# Patient Record
Sex: Male | Born: 1985 | Race: White | Hispanic: No | Marital: Single | Smoking: Former smoker
Health system: Southern US, Community
[De-identification: ages and names within clinical notes are randomized; demographics above are authoritative.]

## PROBLEM LIST (undated history)

## (undated) DIAGNOSIS — M352 Behcet's disease: Secondary | ICD-10-CM

## (undated) DIAGNOSIS — M1990 Unspecified osteoarthritis, unspecified site: Secondary | ICD-10-CM

## (undated) DIAGNOSIS — B019 Varicella without complication: Secondary | ICD-10-CM

## (undated) DIAGNOSIS — IMO0002 Reserved for concepts with insufficient information to code with codable children: Secondary | ICD-10-CM

## (undated) DIAGNOSIS — M797 Fibromyalgia: Secondary | ICD-10-CM

## (undated) DIAGNOSIS — G4730 Sleep apnea, unspecified: Secondary | ICD-10-CM

## (undated) DIAGNOSIS — K219 Gastro-esophageal reflux disease without esophagitis: Secondary | ICD-10-CM

## (undated) DIAGNOSIS — K649 Unspecified hemorrhoids: Secondary | ICD-10-CM

## (undated) DIAGNOSIS — G4700 Insomnia, unspecified: Secondary | ICD-10-CM

## (undated) DIAGNOSIS — K921 Melena: Secondary | ICD-10-CM

## (undated) HISTORY — DX: Reserved for concepts with insufficient information to code with codable children: IMO0002

## (undated) HISTORY — DX: Unspecified osteoarthritis, unspecified site: M19.90

## (undated) HISTORY — DX: Melena: K92.1

## (undated) HISTORY — DX: Fibromyalgia: M79.7

## (undated) HISTORY — DX: Insomnia, unspecified: G47.00

## (undated) HISTORY — DX: Varicella without complication: B01.9

## (undated) HISTORY — DX: Behcet's disease: M35.2

## (undated) HISTORY — PX: LASIK: SHX215

## (undated) HISTORY — PX: COLONOSCOPY: SHX174

## (undated) HISTORY — PX: WISDOM TOOTH EXTRACTION: SHX21

## (undated) HISTORY — DX: Gastro-esophageal reflux disease without esophagitis: K21.9

## (undated) HISTORY — DX: Unspecified hemorrhoids: K64.9

---

## 2007-08-09 ENCOUNTER — Emergency Department (HOSPITAL_COMMUNITY): Admission: EM | Admit: 2007-08-09 | Discharge: 2007-08-09 | Payer: Self-pay | Admitting: Emergency Medicine

## 2007-09-18 ENCOUNTER — Emergency Department (HOSPITAL_COMMUNITY): Admission: EM | Admit: 2007-09-18 | Discharge: 2007-09-18 | Payer: Self-pay | Admitting: Emergency Medicine

## 2010-08-16 ENCOUNTER — Ambulatory Visit (INDEPENDENT_AMBULATORY_CARE_PROVIDER_SITE_OTHER): Payer: BC Managed Care – PPO | Admitting: Internal Medicine

## 2010-08-16 ENCOUNTER — Encounter: Payer: Self-pay | Admitting: Internal Medicine

## 2010-08-16 DIAGNOSIS — R61 Generalized hyperhidrosis: Secondary | ICD-10-CM

## 2010-08-16 DIAGNOSIS — R51 Headache: Secondary | ICD-10-CM

## 2010-08-16 DIAGNOSIS — K5190 Ulcerative colitis, unspecified, without complications: Secondary | ICD-10-CM

## 2010-08-16 DIAGNOSIS — L74519 Primary focal hyperhidrosis, unspecified: Secondary | ICD-10-CM

## 2010-08-16 MED ORDER — TRAMADOL HCL 50 MG PO TABS: 50.0000 mg | ORAL_TABLET | Freq: Four times a day (QID) | ORAL | Status: DC | PRN

## 2010-08-16 MED ORDER — ALUMINUM CHLORIDE 20 % EX SOLN: Freq: Every day | CUTANEOUS | Status: DC

## 2010-08-16 MED ORDER — CEFDINIR 300 MG PO CAPS: 300.0000 mg | ORAL_CAPSULE | Freq: Two times a day (BID) | ORAL | Status: DC

## 2010-08-18 ENCOUNTER — Encounter: Payer: Self-pay | Admitting: Internal Medicine

## 2010-08-18 ENCOUNTER — Ambulatory Visit (INDEPENDENT_AMBULATORY_CARE_PROVIDER_SITE_OTHER): Payer: BC Managed Care – PPO | Admitting: Internal Medicine

## 2010-08-18 DIAGNOSIS — R51 Headache: Secondary | ICD-10-CM

## 2010-08-18 DIAGNOSIS — N481 Balanitis: Secondary | ICD-10-CM

## 2010-08-18 DIAGNOSIS — N476 Balanoposthitis: Secondary | ICD-10-CM

## 2010-08-18 DIAGNOSIS — R000 Tachycardia, unspecified: Secondary | ICD-10-CM

## 2010-08-18 MED ORDER — AZITHROMYCIN 250 MG PO TABS: ORAL_TABLET | ORAL | Status: AC

## 2010-08-18 MED ORDER — KETOROLAC TROMETHAMINE 30 MG/ML IJ SOLN: 30.0000 mg | Freq: Once | INTRAMUSCULAR | Status: DC

## 2010-08-18 MED ORDER — NYSTATIN-TRIAMCINOLONE 100000-0.1 UNIT/GM-% EX OINT: TOPICAL_OINTMENT | Freq: Two times a day (BID) | CUTANEOUS | Status: DC

## 2010-08-18 MED ORDER — KETOROLAC TROMETHAMINE 30 MG/ML IM SOLN
30.0000 mg | Freq: Once | INTRAMUSCULAR | Status: AC
  Administered 2010-08-18: 30 mg via INTRAMUSCULAR

## 2010-08-19 ENCOUNTER — Telehealth: Payer: Self-pay | Admitting: Internal Medicine

## 2010-08-19 NOTE — Telephone Encounter (Signed)
Pt called and cx appt for tomorrow. Pt feeling better. Pt hasnt taken antibiotics yet, and is wondering if he still needs to take med, since he is feeling better?

## 2010-08-20 ENCOUNTER — Ambulatory Visit: Payer: BC Managed Care – PPO | Admitting: Internal Medicine

## 2010-08-20 NOTE — Telephone Encounter (Signed)
Spoke with pt. Per Dr. Rodena Medin, since pt is feeling better he can hold off on taking the abx. Pt will call the offc if he begins to feel bad again.

## 2010-08-21 ENCOUNTER — Encounter: Payer: Self-pay | Admitting: Internal Medicine

## 2010-08-21 DIAGNOSIS — N481 Balanitis: Secondary | ICD-10-CM | POA: Insufficient documentation

## 2010-08-21 DIAGNOSIS — R000 Tachycardia, unspecified: Secondary | ICD-10-CM | POA: Insufficient documentation

## 2010-08-21 DIAGNOSIS — R51 Headache: Secondary | ICD-10-CM | POA: Insufficient documentation

## 2010-08-21 DIAGNOSIS — R519 Headache, unspecified: Secondary | ICD-10-CM | POA: Insufficient documentation

## 2010-08-21 NOTE — Assessment & Plan Note (Addendum)
Attempt Mycolog. Followup if no improvement or worsening.

## 2010-08-21 NOTE — Progress Notes (Signed)
  Subjective:    Patient ID: Keith Walker, male    DOB: [birth year], 23 y.o.   MRN: [id number]  HPI Patient presents to clinic for evaluation of headache. Last visit began Omnicef but this is causing nausea. Continues to have photophobia but has full range of motion of neck. Initially on triage was noted to be tachycardic without palpitations presyncope or syncope. Headache is persisting without neurologic deficit including numbness tingling or weakness. No difficulty with speech or visual disturbances. Also notes an odor and a slight discharge on the head of the penis but no urethral discharge. No alleviating or exacerbating factors. No skin lesions of the penis or the groin. No other complaints.  Reviewed past medical history, medications and allergies    Review of Systems see history of present illness    Objective:   Physical Exam   Physical Exam  Vitals reviewed. Constitutional:  appears well-developed and well-nourished. No distress.  HENT:  Head: Normocephalic and atraumatic.  Right Ear: Tympanic membrane, external ear and ear canal normal.  Left Ear: Tympanic membrane, external ear and ear canal normal.  Nose: Nose normal.  Mouth/Throat: Oropharynx is clear and moist. No oropharyngeal exudate.  Eyes: Conjunctivae and EOM are normal. Pupils are equal, round, and reactive to light. Right eye exhibits no discharge. Left eye exhibits no discharge. No scleral icterus.  Neck: Neck supple. No thyromegaly present.  Cardiovascular: Normal rate, regular rhythm and normal heart sounds.  Exam reveals no gallop and no friction rub.   No murmur heard. Pulmonary/Chest: Effort normal and breath sounds normal. No respiratory distress.  has no wheezes.  has no rales.  Lymphadenopathy:   no cervical adenopathy.  Neurological:  is alert. Cranial nerves II through XII grossly intact. Skin: Skin is warm and dry.  not diaphoretic.  Psychiatric: normal mood and affect.  GU; No urethral  discharge. No skin lesions noted.Nontender. Retraction of foreskin shows minimal erythema and no current discharge wound ulceration.    Assessment & Plan:

## 2010-08-21 NOTE — Assessment & Plan Note (Signed)
EKG demonstrates normal sinus rhythm with a rate of 85. Normal intervals and axis.No evidence of arrhythmia.

## 2010-08-21 NOTE — Assessment & Plan Note (Signed)
Change Omnicef to Zithromax. Given injection of Toradol 30 mg IM. Provided with Maxalt sample x1 10 mg MLT.  Schedule close followup

## 2010-08-22 ENCOUNTER — Encounter: Payer: Self-pay | Admitting: Internal Medicine

## 2010-08-22 DIAGNOSIS — K5190 Ulcerative colitis, unspecified, without complications: Secondary | ICD-10-CM | POA: Insufficient documentation

## 2010-08-22 DIAGNOSIS — R61 Generalized hyperhidrosis: Secondary | ICD-10-CM | POA: Insufficient documentation

## 2010-08-22 NOTE — Assessment & Plan Note (Signed)
Asx. Refer to GI for surveillance.

## 2010-08-22 NOTE — Assessment & Plan Note (Signed)
Neurologically nonfocal. Elements of both migraine and possible sinusitis present. Patient feels more likely is sinusitis. Begin antibiotic. Ultram p.r.n. Pain. followup if no improvement or worsening.

## 2010-08-22 NOTE — Assessment & Plan Note (Signed)
Attempt drisol.

## 2010-08-22 NOTE — Progress Notes (Signed)
  Subjective:    Patient ID: Keith Walker, male    DOB: 05-May-1985, 25 y.o.   MRN: [id number]  HPI patient presents to clinic to establish primary care and for evaluation of possible sinusitis. Notes three-day history of frontal headache with associated nausea and photophobia.Denies fever or chills or significant nasal congestion. Has no history of migraine headaches but does have a family history. Has attempted over-the-counter BC powder without improvement.Pain recently worse. No other exacerbating or alleviating factors. Denies neurologic deficit including numbness, tingling, weakness, difficulty with speech or altered visual acuity. Headache does not wake him up from sleep. 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 Previously attempted prescription antiperspirant but has run out. Denies heat intolerance or thyroid condition. Does have history of ulcerative colitis. No recent abdominal pain or blood in stool.Currently does not have a gastroenterologist following and takes no medication for this condition currently. No other complaints.  Reviewed past medical history, past surgical history, medications, allergies, social history and family history.  Review of Systems  Constitutional: Negative for fever and chills.  Neurological: Positive for headaches. Negative for dizziness, tremors, seizures, syncope, facial asymmetry, speech difficulty, weakness, light-headedness and numbness.  All other systems reviewed and are negative.       Objective:   Physical Exam  Nursing note and vitals reviewed. Constitutional: He appears well-developed and well-nourished. No distress.  HENT:  Head: Normocephalic and atraumatic.  Right Ear: Tympanic membrane, external ear and ear canal normal.  Left Ear: Tympanic membrane, external ear and ear canal normal.  Nose: Nose normal.  Mouth/Throat: Oropharynx is clear and moist. No oropharyngeal exudate.       Mild  tenderness to palpation over frontal sinus.  Eyes: Conjunctivae are normal. No scleral icterus.  Neck: Neck supple.  Cardiovascular: Normal rate, regular rhythm and normal heart sounds.  Exam reveals no gallop and no friction rub.   No murmur heard. Pulmonary/Chest: Effort normal and breath sounds normal. No respiratory distress. He has no wheezes. He has no rales.  Abdominal: Soft. Bowel sounds are normal. He exhibits no distension. There is no tenderness. There is no rebound and no guarding.  Lymphadenopathy:    He has no cervical adenopathy.  Neurological: He is alert. Abnormal reflex: mild tenderness to palpation over frontal sinus.  Skin: Skin is warm and dry. He is not diaphoretic.  neuro: Pupils are round reactive to light, EOM grossly intact, cranial nerves II through XII grossly intact, finger to nose intact bilaterally without evidence and dysmetria. Gait normal.        Assessment & Plan:

## 2010-08-25 ENCOUNTER — Ambulatory Visit (INDEPENDENT_AMBULATORY_CARE_PROVIDER_SITE_OTHER): Payer: BC Managed Care – PPO | Admitting: Internal Medicine

## 2010-08-25 ENCOUNTER — Encounter: Payer: Self-pay | Admitting: Internal Medicine

## 2010-08-25 DIAGNOSIS — K1370 Unspecified lesions of oral mucosa: Secondary | ICD-10-CM

## 2010-08-25 DIAGNOSIS — K121 Other forms of stomatitis: Secondary | ICD-10-CM

## 2010-08-25 MED ORDER — METHYLPREDNISOLONE (PAK) 4 MG PO TABS: ORAL_TABLET | ORAL | Status: AC

## 2010-08-29 ENCOUNTER — Encounter: Payer: Self-pay | Admitting: Internal Medicine

## 2010-08-29 DIAGNOSIS — K121 Other forms of stomatitis: Secondary | ICD-10-CM | POA: Insufficient documentation

## 2010-08-29 NOTE — Assessment & Plan Note (Signed)
Recurrent. Begin oral course of steroids.Followup if no improvement or worsening.

## 2010-08-29 NOTE — Progress Notes (Signed)
  Subjective:    Patient ID: Keith Walker, male    DOB: 09-26-1985, 25 y.o.   MRN: [id number]  HPI Patient presents to clinic for evaluation of oral ulcer. This long-standing history of chronic intermittent ulcers which have been difficult to treat. Has been seen by ENT and actually had one cauterized. Has attempted this disc lidocaine and steroid paste as well as antiviral medication. Only treatment that has helped in the past systemic p.o. Course of steroids. Does have history of ulcerative colitis and has had no recent flare. Currently has painful ulcer along the left proximal lateral tongue border. No alleviating or exacerbating factors. No other complaints.  Reviewed past medical history, medications and allergies    Review of Systems see HPI     Objective:   Physical Exam  Nursing note and vitals reviewed. Constitutional: He appears well-developed and well-nourished.  HENT:  Head: Normocephalic and atraumatic.  Right Ear: External ear normal.  Left Ear: External ear normal.  Mouth/Throat: Oropharynx is clear and moist. No oropharyngeal exudate.       Small shallow ulceration left lateral tongue. No other oral lesions noted  Eyes: Conjunctivae are normal. No scleral icterus.          Assessment & Plan:

## 2010-09-21 ENCOUNTER — Other Ambulatory Visit: Payer: Self-pay | Admitting: Internal Medicine

## 2010-09-21 ENCOUNTER — Ambulatory Visit (INDEPENDENT_AMBULATORY_CARE_PROVIDER_SITE_OTHER): Payer: BC Managed Care – PPO | Admitting: Internal Medicine

## 2010-09-21 ENCOUNTER — Telehealth: Payer: Self-pay | Admitting: *Deleted

## 2010-09-21 ENCOUNTER — Encounter: Payer: Self-pay | Admitting: Internal Medicine

## 2010-09-21 DIAGNOSIS — K1370 Unspecified lesions of oral mucosa: Secondary | ICD-10-CM

## 2010-09-21 DIAGNOSIS — F419 Anxiety disorder, unspecified: Secondary | ICD-10-CM

## 2010-09-21 DIAGNOSIS — F411 Generalized anxiety disorder: Secondary | ICD-10-CM

## 2010-09-21 DIAGNOSIS — K121 Other forms of stomatitis: Secondary | ICD-10-CM

## 2010-09-21 MED ORDER — METHYLPREDNISOLONE (PAK) 4 MG PO TABS: 4.0000 mg | ORAL_TABLET | Freq: Every day | ORAL | Status: DC

## 2010-09-21 MED ORDER — CITALOPRAM HYDROBROMIDE 20 MG PO TABS: 20.0000 mg | ORAL_TABLET | Freq: Every day | ORAL | Status: DC

## 2010-09-21 NOTE — Telephone Encounter (Signed)
Pt called and said that the ulcers have returned in back of throat. Pt is wondering if he could get a refill of the steroid that was prescribed last time. Pls call in to [facility].

## 2010-09-21 NOTE — Telephone Encounter (Signed)
[facility] Triage Call Report Triage Record Num: [id number] Operator: Freddie Breech Patient Name: Keith Walker Call Date & Time: 09/19/2010 11:02:53AM Patient Phone: [phone] PCP: Tera Mater. Clent Ridges Patient Gender: Male PCP Fax : [phone] Patient DOB: 1985/12/19 Practice Name: [facility] Reason for Call: PCP Whit Hodgin. Pt would like an rx called in for steriods for ulcers in his throat. Advised UC now for eval. Med ? Protocol. Protocol(s) Used: Medication Question Calls, No Triage (Adults) Recommended Outcome per Protocol: Provide Information or Advice Only Reason for Outcome: Caller requesting information about medication use with breastfeeding, infant is not ill and triager answers question Care Advice: ~ 09/19/2010 11:09:59AM Page 1 of 1 [email or web address]

## 2010-09-21 NOTE — Telephone Encounter (Signed)
done

## 2010-09-22 ENCOUNTER — Ambulatory Visit: Payer: BC Managed Care – PPO | Admitting: Gastroenterology

## 2010-09-26 DIAGNOSIS — F418 Other specified anxiety disorders: Secondary | ICD-10-CM | POA: Insufficient documentation

## 2010-09-26 NOTE — Assessment & Plan Note (Signed)
Chronic and recurrent. Interested in repeat prednisone taper. Begin Medrol Dosepak today but discouraged repeated use. History of ulcerative colitis and recommend GI followup as currently scheduled

## 2010-09-26 NOTE — Assessment & Plan Note (Signed)
Begin Celexa 20 mg daily. Avoid benzodiazepines.

## 2010-09-26 NOTE — Progress Notes (Signed)
  Subjective:    Patient ID: Keith Walker, male    DOB: 15-Feb-1986, 25 y.o.   MRN: [id number]  HPI Patient presents to clinic for evaluation of mouth ulcers. Has chronic recurrent mouth ulcers has attempted multiple medications in the past without improvement. Prednisone seems to help the most. States has been seen by ENT in the past and required cautery of one ulcer. Concern today about stress and anxiety precipitating these lesions. Has a history of anxiety previously requiring medications. Has tried trazodone in the past without improvement. Anxiety is daily and effects his ADLs. Notes increased stressors. Is not interested in benzodiazepines. No other alleviating or exacerbating factors.  Reviewed past medical history, medications and allergies  Review of Systems  Constitutional: Negative for fever and chills.  HENT: Positive for mouth sores. Negative for facial swelling and dental problem.   Psychiatric/Behavioral: Negative for behavioral problems and agitation. The patient is nervous/anxious.        Objective:   Physical Exam  [nursing notereviewed. Constitutional: He appears well-developed and well-nourished. No distress.  HENT:  Head: Normocephalic and atraumatic.  Right Ear: External ear normal.  Left Ear: External ear normal.  Nose: Nose normal.       Single visualized shallow ulcer left side of mouth  Eyes: Conjunctivae are normal. No scleral icterus.  Skin: Skin is warm and dry. He is not diaphoretic.  Psychiatric: His behavior is normal.          Assessment & Plan:

## 2010-09-28 ENCOUNTER — Telehealth: Payer: Self-pay

## 2010-09-28 MED ORDER — DICLOFENAC SODIUM 75 MG PO TBEC: 75.0000 mg | DELAYED_RELEASE_TABLET | Freq: Two times a day (BID) | ORAL | Status: DC

## 2010-09-28 NOTE — Telephone Encounter (Signed)
Rx for diclofenac 75mg  done, per Dr. Rodena Medin. Pt aware

## 2010-09-28 NOTE — Telephone Encounter (Signed)
Pt left message stating that he hurt his foot on Saturday and is now unable to put pressure on it.   Has a shooting pain when he tries to put foot flat. Would like an anti-inflammatory. pls advise

## 2010-11-02 ENCOUNTER — Ambulatory Visit: Payer: BC Managed Care – PPO | Admitting: Gastroenterology

## 2010-12-07 ENCOUNTER — Ambulatory Visit: Payer: BC Managed Care – PPO | Admitting: Gastroenterology

## 2011-01-18 LAB — COMPREHENSIVE METABOLIC PANEL
BUN: 9
CO2: 28
Calcium: 9.9
Chloride: 103
Creatinine, Ser: 0.88
GFR calc Af Amer: >60
GFR calc non Af Amer: >60
Glucose, Bld: 80
Total Bilirubin: 1.2

## 2011-01-18 LAB — DIFFERENTIAL
Basophils Absolute: 0
Lymphocytes Relative: 25
Lymphs Abs: 1.4
Neutro Abs: 3.7
Neutrophils Relative %: 64

## 2011-01-18 LAB — CBC
HCT: 52.6 — ABNORMAL HIGH
Hemoglobin: 17.6 — ABNORMAL HIGH
MCHC: 33.4
MCV: 93.2
RBC: 5.64

## 2011-01-18 LAB — URINALYSIS, ROUTINE W REFLEX MICROSCOPIC
Bilirubin Urine: NEGATIVE
Glucose, UA: NEGATIVE
Hgb urine dipstick: NEGATIVE
Specific Gravity, Urine: 1.007
Urobilinogen, UA: 0.2

## 2011-01-18 LAB — LIPASE, BLOOD: Lipase: 22

## 2011-01-21 ENCOUNTER — Encounter: Payer: Self-pay | Admitting: Internal Medicine

## 2011-01-21 ENCOUNTER — Ambulatory Visit (INDEPENDENT_AMBULATORY_CARE_PROVIDER_SITE_OTHER): Payer: BC Managed Care – PPO | Admitting: Internal Medicine

## 2011-01-21 VITALS — BP 110/80 | HR 95 | Temp 98.0°F | Resp 16

## 2011-01-21 DIAGNOSIS — F419 Anxiety disorder, unspecified: Secondary | ICD-10-CM

## 2011-01-21 DIAGNOSIS — F411 Generalized anxiety disorder: Secondary | ICD-10-CM

## 2011-01-21 DIAGNOSIS — F17200 Nicotine dependence, unspecified, uncomplicated: Secondary | ICD-10-CM

## 2011-01-21 MED ORDER — VARENICLINE TARTRATE 1 MG PO TABS: 1.0000 mg | ORAL_TABLET | Freq: Two times a day (BID) | ORAL | Status: DC

## 2011-01-21 NOTE — Assessment & Plan Note (Signed)
Discussion held regarding appropriate cessation strategies. reattempt chantix. Total time of discussion ~8 minutes

## 2011-01-21 NOTE — Progress Notes (Signed)
  Subjective:    Patient ID: Keith Walker, male    DOB: 1985/06/28, 25 y.o.   MRN: [id number]  HPI Pt presents to clinic for followup of multiple medical problems. Notes anxiety well controlled with celexa daily without adverse effect. Smokes tobacco and dips regularly. Has managed to decrease smoking amount to ~1/2 ppd. Previously attempted chantix successfully however resumed smoking after several months. Tolerated the medication without side effects. Is interested in re-attempted. Denies any further recent oral ulcers. No other complaints.  Past Medical History  Diagnosis Date  . GERD (gastroesophageal reflux disease)   . Ulcer   . Blood in stool    No past surgical history on file.  reports that he has been smoking Cigarettes.  His smokeless tobacco use includes Snuff. He reports that he drinks alcohol. His drug history not on file. family history includes Cancer in his mother; Heart disease in his maternal grandmother; and Hyperlipidemia in his maternal grandmother. Allergies  Allergen Reactions  . Ciprofloxacin     Big white spots in back of throat     Review of Systems see hpi     Objective:   Physical Exam  Nursing note and vitals reviewed. Constitutional: He appears well-developed and well-nourished. No distress.  HENT:  Head: Normocephalic and atraumatic.  Mouth/Throat: Oropharynx is clear and moist.  Neurological: He is alert.  Skin: He is not diaphoretic.  Psychiatric: He has a normal mood and affect.          Assessment & Plan:

## 2011-01-21 NOTE — Assessment & Plan Note (Signed)
Improved. Continue celexa at current dosing. Consider increase of dose if mood worsens with tobacco cessation.

## 2011-02-01 ENCOUNTER — Ambulatory Visit (INDEPENDENT_AMBULATORY_CARE_PROVIDER_SITE_OTHER): Payer: BC Managed Care – PPO | Admitting: Internal Medicine

## 2011-02-01 ENCOUNTER — Encounter: Payer: Self-pay | Admitting: Internal Medicine

## 2011-02-01 VITALS — BP 114/70 | HR 101 | Temp 98.4°F | Resp 20 | Ht 66.0 in | Wt 203.0 lb

## 2011-02-01 DIAGNOSIS — T50905A Adverse effect of unspecified drugs, medicaments and biological substances, initial encounter: Secondary | ICD-10-CM | POA: Insufficient documentation

## 2011-02-01 DIAGNOSIS — T887XXA Unspecified adverse effect of drug or medicament, initial encounter: Secondary | ICD-10-CM

## 2011-02-01 NOTE — Progress Notes (Signed)
  Subjective:    Patient ID: Keith Walker, male    DOB: 1986-04-05, 25 y.o.   MRN: [id number]  HPI Pt presents to clinic for evaluation of possible medication reaction. Recently attempted chantix for tobacco cessation and developed nightmares, feeling of disconnection, nervous twitches as well as nausea. Last dose taken was last night. Has not used tobacco for 5 days. Denies SI or HI. No other complaints.  Past Medical History  Diagnosis Date  . GERD (gastroesophageal reflux disease)   . Ulcer   . Blood in stool    No past surgical history on file.  reports that he quit smoking 5 days ago. His smoking use included Cigarettes. He quit smokeless tobacco use 5 days ago. His smokeless tobacco use included Snuff. He reports that he drinks alcohol. His drug history not on file. family history includes Cancer in his mother; Heart disease in his maternal grandmother; and Hyperlipidemia in his maternal grandmother. Allergies  Allergen Reactions  . Ciprofloxacin     Big white spots in back of throat       Review of Systems  Psychiatric/Behavioral: Positive for sleep disturbance. Negative for suicidal ideas, hallucinations, behavioral problems, confusion and self-injury. The patient is nervous/anxious.        Objective:   Physical Exam  Constitutional: He appears well-developed and well-nourished.  HENT:  Head: Normocephalic and atraumatic.  Eyes: Conjunctivae are normal. No scleral icterus.  Neurological: He is alert.  Skin: He is not diaphoretic.  Psychiatric: He has a normal mood and affect. His behavior is normal. Judgment and thought content normal.          Assessment & Plan:

## 2011-02-01 NOTE — Assessment & Plan Note (Signed)
Stop chantix and do not remove. Discussed half life of medication. May attempt otc sleep aid tonight to aid in sleep.

## 2011-02-16 ENCOUNTER — Ambulatory Visit: Payer: BC Managed Care – PPO | Admitting: Family

## 2011-02-17 ENCOUNTER — Encounter: Payer: Self-pay | Admitting: Internal Medicine

## 2011-02-17 ENCOUNTER — Ambulatory Visit: Payer: BC Managed Care – PPO | Admitting: Internal Medicine

## 2011-02-17 ENCOUNTER — Ambulatory Visit (INDEPENDENT_AMBULATORY_CARE_PROVIDER_SITE_OTHER): Payer: BC Managed Care – PPO | Admitting: Internal Medicine

## 2011-02-17 DIAGNOSIS — N342 Other urethritis: Secondary | ICD-10-CM

## 2011-02-17 MED ORDER — DOXYCYCLINE HYCLATE 100 MG PO TABS: 100.0000 mg | ORAL_TABLET | Freq: Two times a day (BID) | ORAL | Status: AC

## 2011-02-17 MED ORDER — DOXYCYCLINE HYCLATE 100 MG PO TABS: 100.0000 mg | ORAL_TABLET | Freq: Two times a day (BID) | ORAL | Status: DC

## 2011-02-17 NOTE — Progress Notes (Signed)
  Subjective:    Patient ID: Keith Walker, male    DOB: [birth year], 25 y.o.   MRN: [id number]  HPI Pt presents to clinic for evaluation of urinary frequency. Notes 1wk h/o urinary frequency and dysuria without urethral dc, abd/groin pain, f/c or lbp. No exacerbating or alleviating factors. No other complaints.  Past Medical History  Diagnosis Date  . GERD (gastroesophageal reflux disease)   . Ulcer   . Blood in stool    No past surgical history on file.  reports that he quit smoking about 3 weeks ago. His smoking use included Cigarettes. He quit smokeless tobacco use about 3 weeks ago. His smokeless tobacco use included Snuff. He reports that he drinks alcohol. His drug history not on file. family history includes Cancer in his mother; Heart disease in his maternal grandmother; and Hyperlipidemia in his maternal grandmother. Allergies  Allergen Reactions  . Ciprofloxacin     Big white spots in back of throat     Review of Systems see hpi     Objective:   Physical Exam  Nursing note and vitals reviewed. Constitutional: He appears well-developed and well-nourished. No distress.  Skin: He is not diaphoretic.          Assessment & Plan:

## 2011-02-20 DIAGNOSIS — N342 Other urethritis: Secondary | ICD-10-CM | POA: Insufficient documentation

## 2011-02-20 NOTE — Assessment & Plan Note (Signed)
Defers ua, urine lcr for gc/chlamydia. Begin empiric doxycycline. Followup if no improvement or worsening.

## 2011-03-07 ENCOUNTER — Ambulatory Visit (INDEPENDENT_AMBULATORY_CARE_PROVIDER_SITE_OTHER): Payer: BC Managed Care – PPO | Admitting: Internal Medicine

## 2011-03-07 ENCOUNTER — Encounter: Payer: Self-pay | Admitting: Internal Medicine

## 2011-03-07 VITALS — BP 120/80 | HR 104 | Temp 98.4°F | Resp 18

## 2011-03-07 DIAGNOSIS — R369 Urethral discharge, unspecified: Secondary | ICD-10-CM

## 2011-03-07 MED ORDER — DOXYCYCLINE HYCLATE 100 MG PO TABS: 100.0000 mg | ORAL_TABLET | Freq: Two times a day (BID) | ORAL | Status: AC

## 2011-03-08 LAB — GC/CHLAMYDIA PROBE AMP, URINE: Chlamydia, Swab/Urine, PCR: NEGATIVE

## 2011-03-11 ENCOUNTER — Ambulatory Visit: Payer: BC Managed Care – PPO | Admitting: Internal Medicine

## 2011-03-13 DIAGNOSIS — R369 Urethral discharge, unspecified: Secondary | ICD-10-CM | POA: Insufficient documentation

## 2011-03-13 NOTE — Progress Notes (Signed)
  Subjective:    Patient ID: Keith Walker, male    DOB: 1986/04/17, 25 y.o.   MRN: [id number]  HPI Pt presents to clinic for evaluation of urethral discharge. Recently tx'ed for urethritis with doxy after declining urine lcr. Urinary sx's improved but notes intermittent minimal clear penile discharge. H/o std tx'ed in the past. Notes single partner. No hematuria, fever, chills, penile skin lesions. No other alleviating or exacerbating factors. No other complaints.  Past Medical History  Diagnosis Date  . GERD (gastroesophageal reflux disease)   . Ulcer   . Blood in stool    No past surgical history on file.  reports that he quit smoking about 6 weeks ago. His smoking use included Cigarettes. He quit smokeless tobacco use about 6 weeks ago. His smokeless tobacco use included Snuff. He reports that he drinks alcohol. His drug history not on file. family history includes Cancer in his mother; Heart disease in his maternal grandmother; and Hyperlipidemia in his maternal grandmother. Allergies  Allergen Reactions  . Ciprofloxacin     Big white spots in back of throat       Review of Systems see hpi     Objective:   Physical Exam  Nursing note and vitals reviewed. Constitutional: He appears well-developed and well-nourished. No distress.  Genitourinary: Testes normal. Right testis shows no mass and no tenderness. Left testis shows no mass and no tenderness. No penile tenderness. Discharge found.       Minimal clear dc. No tenderness  Skin: He is not diaphoretic.          Assessment & Plan:

## 2011-03-13 NOTE — Assessment & Plan Note (Signed)
Repeat doxy course. Obtain urine lcr for gc/chlamydia

## 2011-03-28 ENCOUNTER — Ambulatory Visit: Payer: BC Managed Care – PPO | Admitting: Internal Medicine

## 2011-03-29 ENCOUNTER — Encounter: Payer: Self-pay | Admitting: Internal Medicine

## 2011-03-29 ENCOUNTER — Ambulatory Visit (INDEPENDENT_AMBULATORY_CARE_PROVIDER_SITE_OTHER): Payer: BC Managed Care – PPO | Admitting: Internal Medicine

## 2011-03-29 ENCOUNTER — Ambulatory Visit (HOSPITAL_BASED_OUTPATIENT_CLINIC_OR_DEPARTMENT_OTHER)
Admission: RE | Admit: 2011-03-29 | Discharge: 2011-03-29 | Disposition: A | Discharge status: Home / Self Care | Payer: BC Managed Care – PPO | Source: Ambulatory Visit | Attending: Internal Medicine | Admitting: Internal Medicine

## 2011-03-29 DIAGNOSIS — R109 Unspecified abdominal pain: Secondary | ICD-10-CM

## 2011-03-29 DIAGNOSIS — R1031 Right lower quadrant pain: Secondary | ICD-10-CM | POA: Insufficient documentation

## 2011-03-29 DIAGNOSIS — R110 Nausea: Secondary | ICD-10-CM | POA: Insufficient documentation

## 2011-03-29 DIAGNOSIS — R197 Diarrhea, unspecified: Secondary | ICD-10-CM | POA: Insufficient documentation

## 2011-03-29 DIAGNOSIS — K5190 Ulcerative colitis, unspecified, without complications: Secondary | ICD-10-CM

## 2011-03-29 LAB — BASIC METABOLIC PANEL
CO2: 27 mEq/L (ref 19–32)
Calcium: 9.7 mg/dL (ref 8.4–10.5)
Chloride: 98 mEq/L (ref 96–112)
Creat: 0.84 mg/dL (ref 0.50–1.35)
Sodium: 139 mEq/L (ref 135–145)

## 2011-03-29 LAB — CBC WITH DIFFERENTIAL/PLATELET
Basophils Absolute: 0 10*3/uL (ref 0.0–0.1)
Eosinophils Absolute: 0.1 10*3/uL (ref 0.0–0.7)
Eosinophils Relative: 1 % (ref 0–5)
Lymphocytes Relative: 34 % (ref 12–46)
Lymphs Abs: 2.6 10*3/uL (ref 0.7–4.0)
MCV: 91.8 fL (ref 78.0–100.0)
Neutrophils Relative %: 56 % (ref 43–77)
Platelets: 222 10*3/uL (ref 150–400)
RBC: 5.26 MIL/uL (ref 4.22–5.81)
RDW: 12.9 % (ref 11.5–15.5)
WBC: 7.8 10*3/uL (ref 4.0–10.5)

## 2011-03-29 MED ORDER — IOHEXOL 300 MG/ML  SOLN
100.0000 mL | Freq: Once | INTRAMUSCULAR | Status: AC | PRN
  Administered 2011-03-29: 100 mL via INTRAVENOUS

## 2011-03-29 MED ORDER — CIPROFLOXACIN HCL 500 MG PO TABS: 500.0000 mg | ORAL_TABLET | Freq: Two times a day (BID) | ORAL | Status: AC

## 2011-03-29 MED ORDER — METRONIDAZOLE 500 MG PO TABS: 500.0000 mg | ORAL_TABLET | Freq: Three times a day (TID) | ORAL | Status: DC

## 2011-03-29 NOTE — Progress Notes (Signed)
  Subjective:    Patient ID: Keith Walker, male    DOB: 1985-11-17, 25 y.o.   MRN: [id number]  HPI Pt presents to clinic for evaluation of abdominal pain. Continues with urethral discharge clear to while and improved dysuria. GC/chlamydia neg. S/p two courses of doxycycline. Now has 3d h/o diffuse abdominal pain worse at RLQ. Stools have become loose and has mild intermittent blood present. No fever,chills, nausea or vomitting. Recently developed mild bilateral lbp and scrotal pain as well. No other complaints.  Past Medical History  Diagnosis Date  . GERD (gastroesophageal reflux disease)   . Ulcer   . Blood in stool    No past surgical history on file.  reports that he quit smoking about 2 months ago. His smoking use included Cigarettes. He quit smokeless tobacco use about 2 months ago. His smokeless tobacco use included Snuff. He reports that he drinks alcohol. His drug history not on file. family history includes Cancer in his mother; Heart disease in his maternal grandmother; and Hyperlipidemia in his maternal grandmother. No Active Allergies   Review of Systems see hpi     Objective:   Physical Exam  Nursing note and vitals reviewed. Constitutional: He appears well-developed and well-nourished. No distress.  HENT:  Head: Normocephalic and atraumatic.  Eyes: Conjunctivae are normal. No scleral icterus.  Abdominal: Soft. Normal appearance and bowel sounds are normal. He exhibits no distension and no mass. There is no hepatosplenomegaly. There is tenderness in the right lower quadrant. There is no rigidity, no rebound, no guarding, no CVA tenderness and no tenderness at McBurney's point.  Skin: He is not diaphoretic.          Assessment & Plan:

## 2011-03-30 ENCOUNTER — Encounter: Payer: Self-pay | Admitting: Internal Medicine

## 2011-03-30 LAB — URINALYSIS, ROUTINE W REFLEX MICROSCOPIC
Glucose, UA: NEGATIVE mg/dL
Hgb urine dipstick: NEGATIVE
Leukocytes, UA: NEGATIVE
Nitrite: NEGATIVE
Protein, ur: NEGATIVE mg/dL
Urobilinogen, UA: 0.2 mg/dL (ref 0.0–1.0)

## 2011-03-31 ENCOUNTER — Telehealth: Payer: Self-pay | Admitting: Internal Medicine

## 2011-03-31 LAB — URINE CULTURE: Colony Count: NO GROWTH

## 2011-03-31 NOTE — Telephone Encounter (Signed)
Call returned [phone], he was informed that results were still pending on HIV testing, and once those were received he would be called. He was informed all other testing done was normal.  He would like to know if Dr Rodena Medin has any idea of what he may be dealing with since his labs pretty much were normal.

## 2011-03-31 NOTE — Telephone Encounter (Signed)
Patient is requesting lab results.

## 2011-03-31 NOTE — Telephone Encounter (Signed)
Infection until proven otherwise. The labs don't have to be abnormal for that.

## 2011-04-01 ENCOUNTER — Ambulatory Visit: Payer: BC Managed Care – PPO | Admitting: Internal Medicine

## 2011-04-01 LAB — HIV 1/2 CONFIRMATION: HIV-2 Ab: NEGATIVE

## 2011-04-01 NOTE — Telephone Encounter (Signed)
Call placed to patient at [phone], no answer. A detailed voice message was left informing patient per Dr Rodena Medin instruction.

## 2011-04-02 DIAGNOSIS — R109 Unspecified abdominal pain: Secondary | ICD-10-CM | POA: Insufficient documentation

## 2011-04-02 NOTE — Assessment & Plan Note (Signed)
Obtain cbc, chem7, hiv, ua and urine cx. Proceed with abd/pelvic ct. Begin course of cipro/flagyl. Followup if no improvement or worsening.

## 2011-04-04 ENCOUNTER — Telehealth: Payer: Self-pay | Admitting: *Deleted

## 2011-04-04 MED ORDER — METRONIDAZOLE 500 MG PO TABS: 500.0000 mg | ORAL_TABLET | Freq: Three times a day (TID) | ORAL | Status: AC

## 2011-04-04 MED ORDER — CIPROFLOXACIN HCL 500 MG PO TABS: 500.0000 mg | ORAL_TABLET | Freq: Two times a day (BID) | ORAL | Status: AC

## 2011-04-04 NOTE — Telephone Encounter (Signed)
Yes. Repeat both abx cipro/flagyl

## 2011-04-04 NOTE — Telephone Encounter (Signed)
Patient called and provided update. He stated that he about 90% better, however he stated that he still has some burning and would like to know if he could be treated with another round of antibiotics.

## 2011-04-04 NOTE — Telephone Encounter (Signed)
Call returned to patient at [phone], he was advised per Dr Rodena Medin instructions. He was informed Rxs sent to pharmacy.

## 2011-04-11 ENCOUNTER — Telehealth: Payer: Self-pay | Admitting: Internal Medicine

## 2011-04-11 ENCOUNTER — Other Ambulatory Visit: Payer: Self-pay | Admitting: Internal Medicine

## 2011-04-11 DIAGNOSIS — N342 Other urethritis: Secondary | ICD-10-CM

## 2011-04-11 NOTE — Telephone Encounter (Signed)
Call placed to patient at [phone], he was informed per Dr Rodena Medin instructions. He would like to proceed with the urology referral, but he would like to know if there is anything that he could do in the meantime.

## 2011-04-11 NOTE — Telephone Encounter (Signed)
Patient states that he is still not feeling any better after being on two rounds of antibiotics. He states that the drainage is 90% better but that he is experiencing a dry, itchy, burning sensation going down the urethra(sp?). Patient would like to know what the next step will be?

## 2011-04-11 NOTE — Telephone Encounter (Signed)
Urology referral order in.

## 2011-04-11 NOTE — Telephone Encounter (Signed)
Urology if willing

## 2011-04-12 ENCOUNTER — Encounter: Payer: Self-pay | Admitting: Internal Medicine

## 2011-04-21 ENCOUNTER — Ambulatory Visit (INDEPENDENT_AMBULATORY_CARE_PROVIDER_SITE_OTHER): Payer: BC Managed Care – PPO | Admitting: Internal Medicine

## 2011-04-21 ENCOUNTER — Encounter: Payer: Self-pay | Admitting: Internal Medicine

## 2011-04-21 ENCOUNTER — Other Ambulatory Visit (INDEPENDENT_AMBULATORY_CARE_PROVIDER_SITE_OTHER): Payer: BC Managed Care – PPO

## 2011-04-21 DIAGNOSIS — K5190 Ulcerative colitis, unspecified, without complications: Secondary | ICD-10-CM

## 2011-04-21 DIAGNOSIS — K219 Gastro-esophageal reflux disease without esophagitis: Secondary | ICD-10-CM

## 2011-04-21 LAB — COMPREHENSIVE METABOLIC PANEL
AST: 28 U/L (ref 0–37)
Albumin: 4.4 g/dL (ref 3.5–5.2)
BUN: 12 mg/dL (ref 6–23)
Calcium: 9.4 mg/dL (ref 8.4–10.5)
Chloride: 103 mEq/L (ref 96–112)
Potassium: 4.1 mEq/L (ref 3.5–5.1)

## 2011-04-21 LAB — CBC WITH DIFFERENTIAL/PLATELET
Basophils Relative: 0.3 % (ref 0.0–3.0)
Eosinophils Absolute: 0.1 10*3/uL (ref 0.0–0.7)
Eosinophils Relative: 1.8 % (ref 0.0–5.0)
Lymphocytes Relative: 31.2 % (ref 12.0–46.0)
Neutrophils Relative %: 57.4 % (ref 43.0–77.0)
RBC: 5.09 Mil/uL (ref 4.22–5.81)
WBC: 5.4 10*3/uL (ref 4.5–10.5)

## 2011-04-21 MED ORDER — PREDNISONE 10 MG PO TABS: ORAL_TABLET | ORAL | Status: DC

## 2011-04-21 MED ORDER — PANTOPRAZOLE SODIUM 40 MG PO TBEC
40.0000 mg | DELAYED_RELEASE_TABLET | Freq: Every day | ORAL | Status: DC
Start: 2011-04-21 — End: 2011-11-10

## 2011-04-21 MED ORDER — TRAMADOL HCL 50 MG PO TABS: 50.0000 mg | ORAL_TABLET | Freq: Four times a day (QID) | ORAL | Status: DC | PRN

## 2011-04-21 NOTE — Patient Instructions (Signed)
Go directly to the basement to have your labs drawn today.  Pick up your prescriptions from the pharmacy.  cc: Charlynn Court, MD

## 2011-04-21 NOTE — Progress Notes (Signed)
Subjective:    Patient ID: Keith Walker, male    DOB: 01-23-1986, 25 y.o.   MRN: [id number]  HPI Keith Walker is a 25 yo male with PMH of UC (felt left-sided) who is seen in consultation at the request of Dr. Rodena Medin for evaluation of ulcerative colitis.  The patient was diagnosed in 2009 after undergoing flexible sigmoidoscopy with biopsies showing chronic active colitis in the left colon and rectum. At that time he was having lower abdominal pain and bloody diarrhea. He reported that time he was treated by Dr. Bosie Clos with [facility] with prednisone, Lialda, and Rowasa. He reports his symptoms improved for a short time.  Then his care was changed to [facility], Dr. Magda Kiel in 2010.  In 2010 he was having similar symptoms and underwent full colonoscopy. On reviewing this report today it revealed proctitis with otherwise normal appearing colonic mucosa and terminal ileum. He reports around this time the decision was made to treat him again with Azor call and he got no relief. He was then treated with azathioprine which caused significant nausea and vomiting and thus this medication was discontinued. He reports he has subsequently been lost to followup, he would like to establish care here today.  He reports being off all medications for about 18 months. He reports his symptoms significantly worsened approximately 3 months ago when he stopped smoking. Since this time he has developed worsening lower abdominal pain as well as occasional bloody stools. He is currently having 3-4 stools a day which are often loose. He reports mucus in his stools as well as fecal urgency and occasional fecal seepage. He also endorses tenesmus. He reports seeing bright red blood per rectum 2 weeks ago but none since. He denies fevers and chills. Reports his appetite has been okay. He has noticed frequent heartburn of late. No dysphagia or odynophagia. No nausea or vomiting. Weight is increased since  stopping smoking. He also reports urethral discharge over the last 2 months. He reports is nearly constant and whitish in color. He also is having some urinary leakage with dysuria. He reports urinary hesitancy and also a feeling of itching. He has been seen by urology and they feel this may be related to his IBD. He reports having undergone urine testing including exclusion of infection/STDs.  Of note he does report lower back pain near his pelvis as well as knee pain. He denies rash. No eye symptoms. He does report occasional very painful mouth ulcers which seem to respond only to oral prednisone. Mouth ulcers or not currently a problem for him.  Review of Systems Constitutional: Negative for fever, chills, night sweats, activity change, appetite change and unexpected weight change HEENT: Negative for sore throat, see history of present illness Eyes: Negative for visual disturbance Respiratory: Negative for cough, chest tightness and shortness of breath Cardiovascular: Negative for chest pain, palpitations and lower extremity swelling Gastrointestinal: See history of present illness Genitourinary: See history of present illness Musculoskeletal: See history of present illness Skin: Negative for rash or color change Neurological: Negative for headaches, weakness, numbness Hematological: Negative for adenopathy, negative for easy bruising/bleeding Psychiatric/behavioral: Negative for depressed mood, negative for anxiety  Patient Active Problem List  Diagnoses  . Tachycardia  . Ulcerative colitis  . Hyperhidrosis  . Oral ulcer  . Anxiety  . Tobacco use disorder  . Medication reaction  . Urethritis  . Urethral discharge  . Abdominal  pain, other specified site   Past Surgical History  Procedure Date  .  Wisdom tooth extraction    Current Outpatient Prescriptions  Medication Sig Dispense Refill  . hyoscyamine (LEVBID) 0.375 MG 12 hr tablet Take 0.375 mg by mouth every 12 (twelve)  hours as needed.        . citalopram (CELEXA) 20 MG tablet Take 1 tablet (20 mg total) by mouth daily.  30 tablet  6  . pantoprazole (PROTONIX) 40 MG tablet Take 1 tablet (40 mg total) by mouth daily.  30 tablet  5  . predniSONE (DELTASONE) 10 MG tablet Take 40 mg ( 4 tablets) by mouth once daily, decrease to 35 mg ( 3 1/2 tablets) once daily, decrease to 30 mg (3 tablets) once daily, decrease to 25 mg ( 2 1/2 tablets) once daily, decrease to 20 mg ( 2 tablets) once daily, decrease to 15 mg ( 1 1/2 tablets) once daily, decrease to 10 mg ( 1 tablet) once daily, decrease to 5 mg ( 1/2 tablet) once daily then stop.  30 tablet  2  . traMADol (ULTRAM) 50 MG tablet Take 1 tablet (50 mg total) by mouth every 6 (six) hours as needed for pain. Maximum dose= 8 tablets per day  45 tablet  2   No Active Allergies  Family History  Problem Relation Age of Onset  . Breast cancer Mother   . Hyperlipidemia Maternal Grandmother   . Heart disease Maternal Grandmother   . Lung cancer Maternal Grandfather   . Heart disease Paternal Grandfather   . Heart disease Paternal Grandmother     Social History  . Marital Status: Single   Occupational History  . business analyst    Social History Main Topics  . Smoking status: Former Smoker    Types: Cigarettes    Quit date: 01/27/2011  . Smokeless tobacco: Former Neurosurgeon    Types: Snuff    Quit date: 01/27/2011   Comment: forme user of1/2 can daily- 1/2 ppd- Quit 01/27/2011  . Alcohol Use: Yes     occasionally  . Drug Use: No      Objective:   Physical Exam BP 116/84  Pulse 72  Ht 5\' 6"  (1.676 m)  Wt 212 lb (96.163 kg)  BMI 34.22 kg/m2 Constitutional: Well-developed and well-nourished. No distress. HEENT: Normocephalic and atraumatic. Oropharynx is clear and moist. No oropharyngeal exudate. Conjunctivae are normal. Pupils are equal round and reactive to light. No scleral icterus. Neck: Neck supple. Trachea midline. Cardiovascular: Normal rate, regular  rhythm and intact distal pulses. No M/R/G Pulmonary/chest: Effort normal and breath sounds normal. No wheezing, rales or rhonchi. Abdominal: Soft, mild tenderness to palpation across the lower abdomen without rebound or guarding, nondistended. Bowel sounds active throughout. There are no masses palpable. No hepatosplenomegaly. Extremities: no clubbing, cyanosis, or edema Lymphadenopathy: No cervical adenopathy noted. Neurological: Alert and oriented to person place and time. Skin: Skin is warm and dry. No rashes noted. Psychiatric: Normal mood and affect. Behavior is normal.  CBC    Component Value Date/Time   WBC 5.4 04/21/2011 0938   RBC 5.09 04/21/2011 0938   HGB 16.8 04/21/2011 0938   HCT 47.7 04/21/2011 0938   PLT 200.0 04/21/2011 0938   MCV 93.6 04/21/2011 0938   MCH 31.7 03/29/2011 1153   MCHC 35.3 04/21/2011 0938   RDW 12.3 04/21/2011 0938   LYMPHSABS 1.7 04/21/2011 0938   MONOABS 0.5 04/21/2011 0938   EOSABS 0.1 04/21/2011 0938   BASOSABS 0.0 04/21/2011 0938   CMP     Component Value Date/Time   NA  138 04/21/2011 0938   K 4.1 04/21/2011 0938   CL 103 04/21/2011 0938   CO2 28 04/21/2011 0938   GLUCOSE 83 04/21/2011 0938   BUN 12 04/21/2011 0938   CREATININE 0.7 04/21/2011 0938   CREATININE 0.84 03/29/2011 1153   CALCIUM 9.4 04/21/2011 0938   PROT 7.3 04/21/2011 0938   ALBUMIN 4.4 04/21/2011 0938   AST 28 04/21/2011 0938   ALT 34 04/21/2011 0938   ALKPHOS 55 04/21/2011 0938   BILITOT 0.6 04/21/2011 0938   GFRNONAA >60 08/09/2007 1530   GFRAA  Value: >60        The eGFR has been calculated using the MDRD equation. This calculation has not been validated in all clinical 08/09/2007 1530   CT ABDOMEN AND PELVIS WITH CONTRAST 03/29/11:  Technique: Multidetector CT imaging of the abdomen and pelvis was  performed following the standard protocol during bolus  administration of intravenous contrast.  Contrast: OMNIPAQUE IOHEXOL 300 MG/ML IV SOLN  Comparison: August 09, 2007  Findings: The lung bases are clear. The liver, gallbladder,  spleen, pancreas, adrenal glands, kidneys, urinary bladder, osseous  structures have a normal appearance. There is a left inguinal  hernia which contains fat only. The bowel is unremarkable with no  evidence of gross inflammation or obstruction. The appendix is not  identified but there are no secondary signs of appendicitis. There  is no adenopathy or free fluid within the abdomen or pelvis.   IMPRESSION:  Stable CT scan of the abdomen and pelvis with no acute abnormality.     Assessment & Plan:   25 yo male with PMH of UC (felt left-sided) who is seen in consultation at the request of Dr. Rodena Medin for evaluation of ulcerative colitis.    1. UC -- the patient has a confirmed histologic diagnosis of ulcerative colitis as of 2009. He also had rectal inflammation consistent with UC on colonoscopy in 2010. He has been treated with 5-ASA therapy in the past apparently without significant response. He was also tried on azathioprine but this medication was discontinued and its use limited secondary to extreme nausea and vomiting. At present he has symptoms consistent with ongoing active ulcerative colitis including diarrhea with occasional blood, lower abdominal pain,  and symptoms of rectal inflammation including tenesmus and fecal urgency.  It also appears he is having extraintestinal manifestations of his disease, particularly with joint pains, possibly sacroiliitis. His urinary symptoms also may be related to inflammatory bowel disease, though this is a less common association. He has had an extensive workup for this by urology and no infectious etiology has been found. I have checked labs today including a CBC, CMP and ESR.  At present I would like to treat his active UC with a prednisone taper. I will see him back in 4 weeks' time, at which time we can gauge his response to this medication. Hopefully his bowel, joint, and possibly  urinary symptoms will have improved.  If so, then we will know for certain that his symptoms all relate to his IBD.  If there's been no or little improvement, then we will proceed with colonoscopy.  Finally, we discussed the need going forward, assuming he responds to steroids, for maintenance therapy. He is intolerant to the immunomodulators secondary to nausea, leaving 5-ASA therapy as an option (though in the past he seems to not have had a great response) or biologic therapy such as Remicade.  Finally, I have given him a prescription for tramadol to be  used as needed for pain.  2. Heartburn -- will give a trial of pantoprazole 40 mg daily. Will gauge his response a followup   Return in 1 month or sooner if needed.

## 2011-04-28 ENCOUNTER — Telehealth: Payer: Self-pay | Admitting: *Deleted

## 2011-04-28 NOTE — Telephone Encounter (Signed)
Message copied by Daphine Deutscher on Thu Apr 28, 2011  3:03 PM ------      Message from: Beverley Fiedler      Created: Thu Apr 21, 2011  2:02 PM       Labs normal, proceed as planned

## 2011-04-28 NOTE — Telephone Encounter (Signed)
Patient is not available at his number.

## 2011-04-29 NOTE — Telephone Encounter (Signed)
Spoke with patient and gave him lab results and recommendation by Dr. Rhea Belton

## 2011-05-03 ENCOUNTER — Telehealth: Payer: Self-pay | Admitting: Internal Medicine

## 2011-05-03 DIAGNOSIS — K5190 Ulcerative colitis, unspecified, without complications: Secondary | ICD-10-CM

## 2011-05-03 DIAGNOSIS — R197 Diarrhea, unspecified: Secondary | ICD-10-CM

## 2011-05-03 NOTE — Telephone Encounter (Signed)
Pt seen on 04/21/11 for UC; establishing as a pt here. He was placed on a tapering dose of prednisone beginning at 40mg /daily.Pt reports he has decreased to 35mg  Prednisone daily, but he is wired, shaky and is having problems functioning. He states his bleeding has stopped and he feels better. Pt knows he can't stop the Prednisone, but can he decrease the taper faster? Thanks.

## 2011-05-04 NOTE — Telephone Encounter (Signed)
Spoke with pt who was about to call me. He reports his problems are coming back. His abdominal pain and cramping have returned and he reports he gets ulcers in the back of his throat; the ulcers turn white and then open up. Pt reports this is not candida, he's had bx before. Pt was to decrease to prednisone today from 35mg  to 30mg , but since his s&s came back; he is to decrease again tomorrow to 30mg /day. Pt was instructed to try the LEVBID for his cramping.

## 2011-05-04 NOTE — Telephone Encounter (Signed)
lmom for pt to call back

## 2011-05-04 NOTE — Telephone Encounter (Signed)
Decrease to 20 mg daily x 7 days and taper from there.  This should prevent many of the side-effects that are hard to tolerate He should have followup with me after about 1 month of steroids THanks

## 2011-05-04 NOTE — Telephone Encounter (Signed)
Per our discussion, see if the patient's urinary and joint symptoms have improved on steroids. Also see what symptoms flared as he was tapering prednisone. If bowel symptoms continue to be a problem such as diarrhea and stool studies should be performed and if negative consideration of flexible sigmoidoscopy. If we can taper her steroids significantly he may need further therapy for colitis such as biologics

## 2011-05-04 NOTE — Telephone Encounter (Signed)
Pt reports as far as his urinary problems, the pain is better, but he still has drainage. His arthritic symptoms remain; very achy. When he tried to taper off the steroids, he states before, he will get better and then regress. His symptoms include cramping and diarrhea with no visible blood. I asked if he's tried the hyoscyamine for the pain and he reports in the past, it hasn't worked for the urinary s&s. Instructed him to increase the hyoscyamine to tid. Informed him of Dr Lauro Franklin findings/recommendations and I will order the stool studies; pt stated understanding.

## 2011-05-05 ENCOUNTER — Other Ambulatory Visit: Payer: BC Managed Care – PPO

## 2011-05-05 DIAGNOSIS — R197 Diarrhea, unspecified: Secondary | ICD-10-CM

## 2011-05-05 DIAGNOSIS — K5190 Ulcerative colitis, unspecified, without complications: Secondary | ICD-10-CM

## 2011-05-06 LAB — OVA AND PARASITE SCREEN: OP: NONE SEEN

## 2011-05-09 ENCOUNTER — Other Ambulatory Visit: Payer: Self-pay | Admitting: Internal Medicine

## 2011-05-10 ENCOUNTER — Telehealth: Payer: Self-pay | Admitting: *Deleted

## 2011-05-10 NOTE — Telephone Encounter (Signed)
Wrong CDIFF ordered. [facility] and they have already had the correct test ordered.

## 2011-05-11 ENCOUNTER — Other Ambulatory Visit: Payer: Self-pay | Admitting: Internal Medicine

## 2011-05-13 ENCOUNTER — Telehealth: Payer: Self-pay | Admitting: *Deleted

## 2011-05-13 LAB — CLOSTRIDIUM DIFFICILE CULTURE-FECAL

## 2011-05-13 NOTE — Telephone Encounter (Signed)
Message copied by Florene Glen on Fri May 13, 2011  1:47 PM ------      Message from: Beverley Fiedler      Created: Fri May 13, 2011  1:32 PM       c diff neg

## 2011-05-13 NOTE — Telephone Encounter (Signed)
Notified pt his stool was negative for CDIFF. He stated he's doing a little better; still has the cramping from time to time on his right side. Pt instructed to call for further questions or problems. Pt stated understanding and has a f/u on 05/19/11.

## 2011-05-16 ENCOUNTER — Other Ambulatory Visit: Payer: Self-pay | Admitting: Gastroenterology

## 2011-05-16 MED ORDER — HYOSCYAMINE SULFATE ER 0.375 MG PO TB12: 0.3750 mg | ORAL_TABLET | Freq: Three times a day (TID) | ORAL | Status: DC

## 2011-05-17 ENCOUNTER — Encounter: Payer: Self-pay | Admitting: Internal Medicine

## 2011-05-19 ENCOUNTER — Ambulatory Visit (HOSPITAL_COMMUNITY)
Admission: RE | Admit: 2011-05-19 | Discharge: 2011-05-19 | Disposition: A | Discharge status: Home / Self Care | Payer: BC Managed Care – PPO | Source: Ambulatory Visit | Attending: Internal Medicine | Admitting: Internal Medicine

## 2011-05-19 ENCOUNTER — Encounter: Payer: Self-pay | Admitting: Internal Medicine

## 2011-05-19 ENCOUNTER — Ambulatory Visit (INDEPENDENT_AMBULATORY_CARE_PROVIDER_SITE_OTHER): Payer: BC Managed Care – PPO | Admitting: Internal Medicine

## 2011-05-19 DIAGNOSIS — F419 Anxiety disorder, unspecified: Secondary | ICD-10-CM

## 2011-05-19 DIAGNOSIS — M533 Sacrococcygeal disorders, not elsewhere classified: Secondary | ICD-10-CM | POA: Insufficient documentation

## 2011-05-19 DIAGNOSIS — K219 Gastro-esophageal reflux disease without esophagitis: Secondary | ICD-10-CM

## 2011-05-19 DIAGNOSIS — F411 Generalized anxiety disorder: Secondary | ICD-10-CM

## 2011-05-19 DIAGNOSIS — K5190 Ulcerative colitis, unspecified, without complications: Secondary | ICD-10-CM

## 2011-05-19 DIAGNOSIS — M545 Low back pain, unspecified: Secondary | ICD-10-CM

## 2011-05-19 DIAGNOSIS — R369 Urethral discharge, unspecified: Secondary | ICD-10-CM

## 2011-05-19 MED ORDER — TRAMADOL HCL 50 MG PO TABS: 100.0000 mg | ORAL_TABLET | Freq: Four times a day (QID) | ORAL | Status: AC | PRN

## 2011-05-19 MED ORDER — CITALOPRAM HYDROBROMIDE 20 MG PO TABS: 20.0000 mg | ORAL_TABLET | Freq: Every day | ORAL | Status: DC

## 2011-05-19 MED ORDER — PEG-KCL-NACL-NASULF-NA ASC-C 100 G PO SOLR: 1.0000 | Freq: Once | ORAL | Status: DC

## 2011-05-19 NOTE — Progress Notes (Signed)
Subjective:    Patient ID: Keith Walker, male    DOB: 1985/09/07, 26 y.o.   MRN: [id number]  HPI Keith Walker is a 26 year old male who returns today in follow-up of ulcerative colitis (left-sided).  He was last seen approximately 4 weeks ago and at that time was having bowel symptoms which he felt was consistent with his ulcerative colitis along with continued urethral discharge, multiple joint pains, and intermittent oral ulcerations.  He was started on a prednisone taper starting at 40 mg daily and he returns today now having tapered down to 20 mg daily. He was also started on pantoprazole for heartburn. Today, he reports that he continues to have right-sided (mid and lower) abdominal pain. This is intermittent and not always related to eating or bowel movement. He tried tramadol for this pain without much benefit. He reports his stools are still not normal, though he has not had any further rectal bleeding. He does have mucus in his stools and they are not formed. He reports having as many as 10 stools a day, and then the next day he may not have a bowel movement. He is endorsing tenesmus.  Regarding his urinary drainage, this has decreased significantly, but is still present in the early a.m. This continues to be a clear aura at times milky fluid. The dysuria has resolved completely. He continues to have lower pain near his sacrum as well as shoulder pain bilaterally. His appetite is now graciously he's gained approximate 6 pounds. His heartburn has improved significantly with pantoprazole and is no longer a problem. He also reports no oral ulcerations and he feels this is related to prednisone use. Still no fevers or chills. He is having some burning pain as well as a mucus-like production from his eyes.   Review of Systems As per HPI, otherwise neg  Patient Active Problem List  Diagnoses  . Tachycardia  . Ulcerative colitis  . Hyperhidrosis  . Oral ulcer  . Anxiety  . Tobacco use disorder -  in remission  . Medication reaction  . Urethritis  . Urethral discharge  . Abdominal  pain, other specified site   Past Surgical History  Procedure Date  . Wisdom tooth extraction    Current Outpatient Prescriptions  Medication Sig Dispense Refill  . citalopram (CELEXA) 20 MG tablet Take 1 tablet (20 mg total) by mouth daily.  30 tablet  5  . hyoscyamine (LEVBID) 0.375 MG 12 hr tablet Take 1 tablet (0.375 mg total) by mouth 3 (three) times daily.  60 tablet  0  . pantoprazole (PROTONIX) 40 MG tablet Take 1 tablet (40 mg total) by mouth daily.  30 tablet  5  . predniSONE (DELTASONE) 10 MG tablet Take 40 mg ( 4 tablets) by mouth once daily, decrease to 35 mg ( 3 1/2 tablets) once daily, decrease to 30 mg (3 tablets) once daily, decrease to 25 mg ( 2 1/2 tablets) once daily, decrease to 20 mg ( 2 tablets) once daily, decrease to 15 mg ( 1 1/2 tablets) once daily, decrease to 10 mg ( 1 tablet) once daily, decrease to 5 mg ( 1/2 tablet) once daily then stop.  30 tablet  2  . traMADol (ULTRAM) 50 MG tablet Take 2 tablets (100 mg total) by mouth every 6 (six) hours as needed for pain. Maximum dose= 8 tablets per day  45 tablet  5  . peg 3350 powder (MOVIPREP) 100 G SOLR Take 1 kit (100 g total) by mouth once.  1  kit  0   No Known Allergies  SH - reviewed and no change  Family History  Problem Relation Age of Onset  . Breast cancer Mother   . Hyperlipidemia Maternal Grandmother   . Heart disease Maternal Grandmother   . Lung cancer Maternal Grandfather   . Heart disease Paternal Grandfather   . Heart disease Paternal Grandmother        Objective:   Physical Exam BP 124/68  Pulse 108  Ht 5\' 6"  (1.676 m)  Wt 218 lb (98.884 kg)  BMI 35.19 kg/m2 Constitutional: Well-developed and well-nourished. No distress. HEENT: Normocephalic and atraumatic. Oropharynx is clear and moist, there are no signs of oral ulceration. No oropharyngeal exudate. Conjunctivae are normal. Pupils are equal round  and reactive to light. No scleral icterus. Neck: Neck supple. Trachea midline. Cardiovascular: Tachycardic but regular and intact distal pulses. No M/R/G Pulmonary/chest: Effort normal and breath sounds normal. No wheezing, rales or rhonchi. Abdominal: Soft, lower abdominal tenderness to deep palpation without rebound or guarding, nondistended. Bowel sounds active throughout. There are no masses palpable. No hepatosplenomegaly. Extremities: no clubbing, cyanosis, or edema Lymphadenopathy: No cervical adenopathy noted. Neurological: Alert and oriented to person place and time. Skin: Skin is warm and dry. No rashes noted. Psychiatric: Normal mood and affect. Behavior is normal. Musculoskeletal: Tenderness bilateral SI joints  CBC    Component Value Date/Time   WBC 5.4 04/21/2011 0938   RBC 5.09 04/21/2011 0938   HGB 16.8 04/21/2011 0938   HCT 47.7 04/21/2011 0938   PLT 200.0 04/21/2011 0938   MCV 93.6 04/21/2011 0938   MCH 31.7 03/29/2011 1153   MCHC 35.3 04/21/2011 0938   RDW 12.3 04/21/2011 0938   LYMPHSABS 1.7 04/21/2011 0938   MONOABS 0.5 04/21/2011 0938   EOSABS 0.1 04/21/2011 0938   BASOSABS 0.0 04/21/2011 0938    CMP     Component Value Date/Time   NA 138 04/21/2011 0938   K 4.1 04/21/2011 0938   CL 103 04/21/2011 0938   CO2 28 04/21/2011 0938   GLUCOSE 83 04/21/2011 0938   BUN 12 04/21/2011 0938   CREATININE 0.7 04/21/2011 0938   CREATININE 0.84 03/29/2011 1153   CALCIUM 9.4 04/21/2011 0938   PROT 7.3 04/21/2011 0938   ALBUMIN 4.4 04/21/2011 0938   AST 28 04/21/2011 0938   ALT 34 04/21/2011 0938   ALKPHOS 55 04/21/2011 0938   BILITOT 0.6 04/21/2011 0938   GFRNONAA >60 08/09/2007 1530   GFRAA  Value: >60        The eGFR has been calculated using the MDRD equation. This calculation has not been validated in all clinical 08/09/2007 1530   ESR = 8 (before steroids)  Stool studies -- negative     Assessment & Plan:  This is a 26 year old male with a past medical  history of left-sided ulcerative colitis along with multiple probable extra intestinal manifestations of IBD presenting for follow-up  1. UC -- at this point the patient seemed to respond somewhat to a prednisone taper, but not completely. He also is having multiple other complaints including intermittent oral and posterior pharyngeal ulceration, joint pains (primarily shoulders and SI joints), continued (sees though improved) urethral discharge all of which may relate to his inflammatory bowel disease. Other conditions in the differential include Reiter's syndrome and Behcet's disease. At this point given his ongoing bowel complaints, loose stools, etc. I would like to proceed with colonoscopy to assess his current state of ulcerative colitis.  If his disease  continues to be active, I think it would be appropriate to start him on biologic therapy either with infliximab or adalimumab. I would like to image his sacroiliac joints to evaluate for sacroiliitis. Also he can increase his tramadol to 100 mg every 6 hours as needed for pain. I reminded him to avoid NSAIDs. -Regarding his urethral discharge, he has had extensive workup with urology and had infection ruled out it is also been treated with multiple rounds of antibiotics. At this point I do not feel that this is an infectious process but may relate to his autoimmune condition.  2. GERD -- he has had a nice response to pantoprazole and will continue this at his current dose.  3. Anxiety -- he has been taking Celexa 20 mg daily and he reports that this is helped significant with his anxiety. He does not feel depressed. He is asked to follow refill this medication, and I'm comfortable doing so. It was renewed for him today.  Further followup after colonoscopy. If his bowel disease is relatively inactive at this point, I will consider rheumatology referral.

## 2011-05-19 NOTE — Patient Instructions (Addendum)
You have been scheduled for a colonoscopy. Please follow written instructions given to you at your visit today.  Please pick up your prep kit at the pharmacy within the next 2-3 days.   We have sent the following medications to your pharmacy for you to pick up at your convenience: Moviprep  You have been scheduled for an S/I Film at [facility] Go to the Radiology Depart at your earliest convenience.

## 2011-05-20 ENCOUNTER — Telehealth: Payer: Self-pay | Admitting: *Deleted

## 2011-05-20 NOTE — Telephone Encounter (Signed)
Message copied by Florene Glen on Fri May 20, 2011  8:18 AM ------      Message from: Beverley Fiedler      Created: Thu May 19, 2011 10:29 PM       No radiographic evidence for sacroileitis at present

## 2011-05-20 NOTE — Telephone Encounter (Signed)
lmom for pt to call back for results

## 2011-05-23 ENCOUNTER — Encounter: Payer: Self-pay | Admitting: Internal Medicine

## 2011-05-23 ENCOUNTER — Ambulatory Visit (AMBULATORY_SURGERY_CENTER): Payer: BC Managed Care – PPO | Admitting: Internal Medicine

## 2011-05-23 VITALS — BP 140/85 | HR 87 | Temp 96.0°F | Resp 15 | Ht 66.0 in | Wt 218.0 lb

## 2011-05-23 DIAGNOSIS — K5289 Other specified noninfective gastroenteritis and colitis: Secondary | ICD-10-CM

## 2011-05-23 DIAGNOSIS — M545 Low back pain, unspecified: Secondary | ICD-10-CM

## 2011-05-23 DIAGNOSIS — Z8719 Personal history of other diseases of the digestive system: Secondary | ICD-10-CM

## 2011-05-23 DIAGNOSIS — K5190 Ulcerative colitis, unspecified, without complications: Secondary | ICD-10-CM

## 2011-05-23 MED ORDER — SODIUM CHLORIDE 0.9 % IV SOLN: 500.0000 mL | INTRAVENOUS | Status: DC

## 2011-05-23 NOTE — Telephone Encounter (Signed)
Pt having his endoscopy today at 4pm.

## 2011-05-23 NOTE — Patient Instructions (Signed)
FOLLOW DISCHARGE INSTRUCTIONS (BLUE AND GREEN SHEETS).. 

## 2011-05-23 NOTE — Op Note (Signed)
[facility] [address] [place], [place]  [address]  COLONOSCOPY PROCEDURE REPORT  PATIENT:  Keith Walker, Keith Walker  MR#:  [id number] BIRTHDATE:  05-28-1985, 25 yrs. old  GENDER:  male ENDOSCOPIST:  Carie Caddy. Ambert Virrueta, MD REF. BY:  Charlynn Court, M.D. PROCEDURE DATE:  05/23/2011 PROCEDURE:  Colonoscopy with biopsy ASA CLASS:  Class II INDICATIONS:  Abdominal pain, extent of chronic inflammatory bowel disease MEDICATIONS:   MAC sedation, administered by CRNA, propofol (Diprivan) 430 mg IV  DESCRIPTION OF PROCEDURE:   After the risks benefits and alternatives of the procedure were thoroughly explained, informed consent was obtained.  Digital rectal exam was performed and revealed no rectal masses.   The [id number] endoscope was introduced through the anus and advanced to the terminal ileum which was intubated for a short distance, without limitations. The quality of the prep was good, using MoviPrep.  The instrument was then slowly withdrawn as the colon was fully examined. <<PROCEDUREIMAGES>>  FINDINGS:  The terminal ileum appeared normal.  The mucosa was mildly erythematous in the sigmoid colon. Multiple biopsies were obtained and sent to pathology.  This was otherwise a normal examination of the colon. Given history of ulcerative colitis, biopsies were obtained from the right colon, left colon and rectum (3 Jars).   Retroflexed views in the rectum revealed no abnormalities.  The scope was then withdrawn from the cecum and the procedure completed.  COMPLICATIONS:  None ENDOSCOPIC IMPRESSION: 1) Normal terminal ileum 2) Mild erythema in the sigmoid colon. 3) Otherwise normal examination 4) Biopsies obtained from right colon, left colon, and rectum.  RECOMMENDATIONS: 1) Await pathology results 2) Continue current medications. 3) Will await pathology results, but also consider Rheumatology referral given multiple, extra-intestinal, symptoms.  Carie Caddy. Rhea Belton,  MD  CC:  Charlynn Court MD The Patient  n. eSIGNEDCarie Caddy. Yishai Rehfeld at 05/23/2011 03:44 PM  [place], [place], [phone]

## 2011-05-23 NOTE — Progress Notes (Signed)
Patient did not experience any of the following events: a burn prior to discharge; a fall within the facility; wrong site/side/patient/procedure/implant event; or a hospital transfer or hospital admission upon discharge from the facility. (G8907) Patient did not have preoperative order for IV antibiotic SSI prophylaxis. (G8918)  

## 2011-05-24 ENCOUNTER — Telehealth: Payer: Self-pay | Admitting: *Deleted

## 2011-05-24 NOTE — Telephone Encounter (Signed)
  Follow up Call-  Call back number 05/23/2011  Post procedure Call Back phone  # [phone]  Permission to leave phone message Yes     Patient questions:  Do you have a fever, pain , or abdominal swelling? no Pain Score  0 *  Have you tolerated food without any problems? no  Have you been able to return to your normal activities? yes  Do you have any questions about your discharge instructions: Diet   no Medications  no Follow up visit  no  Do you have questions or concerns about your Care? no  Actions: * If pain score is 4 or above: No action needed, pain <4.

## 2011-06-10 ENCOUNTER — Telehealth: Payer: Self-pay | Admitting: *Deleted

## 2011-06-10 NOTE — Telephone Encounter (Signed)
Informed pt of referral to a Rheumatologist and someone will contact him; pt stated understanding.

## 2011-06-10 NOTE — Telephone Encounter (Signed)
Message copied by Florene Glen on Fri Jun 10, 2011 10:13 AM ------      Message from: Beverley Fiedler      Created: Thu Jun 09, 2011  1:48 PM       I called to discuss the pathology results today with the patient by phone      His inflammatory bowel disease is not active at present, and therefore I do not think inflammatory bowel disease is necessarily the cause of his host of other extraintestinal symptoms      He is continuing to have urethral discharge and is waiting on followup with urology.  He also has had frequent joint pains, though this is somewhat better at present      I would like to refer him to [facility] medical rheumatology for further evaluation of his constellation of symptoms      Please be sure they get my clinic notes and procedure notes.      I've asked the patient to call me if symptoms worsen and I should see him in about 6 weeks, hopefully after he see neurology and rheumatology      Again, I do not think he needs escalation of IBD medications at this time      Thank you            Our discussion by phone is in lieu of a path letter

## 2011-06-10 NOTE — Telephone Encounter (Signed)
lmom for pt to call back. Faxed ofc note for Rheumatology referral tp [facility]. Fax [phone], ph [phone]

## 2011-11-10 ENCOUNTER — Other Ambulatory Visit: Payer: Self-pay | Admitting: Internal Medicine

## 2011-12-23 ENCOUNTER — Encounter: Payer: Self-pay | Admitting: Internal Medicine

## 2011-12-27 ENCOUNTER — Encounter: Payer: Self-pay | Admitting: Internal Medicine

## 2011-12-27 ENCOUNTER — Ambulatory Visit (INDEPENDENT_AMBULATORY_CARE_PROVIDER_SITE_OTHER): Payer: BC Managed Care – PPO | Admitting: Internal Medicine

## 2011-12-27 VITALS — BP 108/78 | HR 90 | Ht 66.5 in | Wt 203.8 lb

## 2011-12-27 DIAGNOSIS — M4690 Unspecified inflammatory spondylopathy, site unspecified: Secondary | ICD-10-CM

## 2011-12-27 DIAGNOSIS — K219 Gastro-esophageal reflux disease without esophagitis: Secondary | ICD-10-CM

## 2011-12-27 DIAGNOSIS — K5150 Left sided colitis without complications: Secondary | ICD-10-CM

## 2011-12-27 MED ORDER — PANTOPRAZOLE SODIUM 40 MG PO TBEC: 40.0000 mg | DELAYED_RELEASE_TABLET | Freq: Every day | ORAL | Status: DC

## 2011-12-27 NOTE — Patient Instructions (Addendum)
We have sent the following medications to your pharmacy for you to pick up at your convenience:Protonix  Follow up with Dr. Rhea Belton in 6 months

## 2011-12-27 NOTE — Progress Notes (Signed)
  Subjective:    Patient ID: Keith Walker, male    DOB: 1985/12/04, 26 y.o.   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 Dierdre Forth who seen in followup. He underwent colonoscopy on 05/23/2011 which was negative for overt ulcerative colitis and biopsies were benign without inflammation, but he continued to have issues with oral ulcers, joint pains, and urethral discharge. Dr. Dierdre Forth initiated infliximab therapy, and this has been life changing for him. He went through induction with success with plans for 5 mg per kilogram every 8 weeks, however his symptoms have returned closer to the 6-7 week mark. Therefore now he is getting infusion every 6-7 weeks. He is planned for infusion later today which will be the 6 week mark. When he is nearing his next infusion, he notes increasing joint pains, more frequent loose stools or diarrhea, and worsening joint pains. None of these problems are issues for him after infusion. Currently, being due for infusion, he has noted loose stools with intermittent blood. He also has had some increasing lower abdominal discomfort. No fevers or chills.  No rashes  Review of Systems As per history of present illness, otherwise negative  Current Medications, Allergies, Past Medical History, Past Surgical History, Family History and Social History were reviewed in [employer] record.     Objective:   Physical Exam BP 108/78  Pulse 90  Ht 5' 6.5" (1.689 m)  Wt 203 lb 12.8 oz (92.443 kg)  BMI 32.40 kg/m2 Constitutional: Well-developed and well-nourished. No distress. HEENT: Normocephalic and atraumatic. Oropharynx is clear and moist. No oropharyngeal exudate. Conjunctivae are normal.  No scleral icterus. Neck: Neck supple. Trachea midline. Cardiovascular: Normal rate, regular rhythm and intact distal  pulses. No M/R/G Pulmonary/chest: Effort normal and breath sounds normal. No wheezing, rales or rhonchi. Abdominal: Soft, mild diffuse lower tenderness without rebound or guarding, nondistended. Bowel sounds active throughout. There are no masses palpable. No hepatosplenomegaly. Extremities: no clubbing, cyanosis, or edema Lymphadenopathy: No cervical adenopathy noted. Neurological: Alert and oriented to person place and time. Skin: Skin is warm and dry. No rashes noted. Psychiatric: Normal mood and affect. Behavior is normal.    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 Dierdre Forth who seen in followup.  1. 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 It does seem that he needs this medication every 6 weeks rather than every 8. Dr. Dierdre Forth is writing this medication for him. We have discussed this medication today, and I have reminded him to avoid missing infusions whenever possible. He seems very motivated to continue this medication as it has been so good for him. We discussed how should his UC symptoms flare despite medication, he would likely need repeat colonoscopy versus flexible sigmoidoscopy, so that we could make any changes to therapy based on endoscopic/histologic findings. I will see him back in 6 month's time or sooner if necessary.    2. GERD -- well controlled on pantoprazole 40 mg daily. He'll continue this dose and refills given today.

## 2012-01-17 ENCOUNTER — Encounter: Payer: BC Managed Care – PPO | Admitting: Internal Medicine

## 2012-02-20 ENCOUNTER — Ambulatory Visit (INDEPENDENT_AMBULATORY_CARE_PROVIDER_SITE_OTHER): Payer: BC Managed Care – PPO | Admitting: Internal Medicine

## 2012-02-20 ENCOUNTER — Encounter: Payer: Self-pay | Admitting: Internal Medicine

## 2012-02-20 VITALS — BP 126/80 | HR 78 | Temp 98.1°F | Resp 12 | Ht 66.0 in | Wt 204.1 lb

## 2012-02-20 DIAGNOSIS — Z0000 Encounter for general adult medical examination without abnormal findings: Secondary | ICD-10-CM

## 2012-02-20 DIAGNOSIS — K4090 Unilateral inguinal hernia, without obstruction or gangrene, not specified as recurrent: Secondary | ICD-10-CM

## 2012-02-20 LAB — CBC WITH DIFFERENTIAL/PLATELET
Basophils Absolute: 0 10*3/uL (ref 0.0–0.1)
Basophils Relative: 0 % (ref 0–1)
HCT: 47.8 % (ref 39.0–52.0)
Hemoglobin: 17.1 g/dL — ABNORMAL HIGH (ref 13.0–17.0)
Lymphocytes Relative: 31 % (ref 12–46)
MCHC: 35.8 g/dL (ref 30.0–36.0)
Monocytes Absolute: 0.7 10*3/uL (ref 0.1–1.0)
Neutro Abs: 3.7 10*3/uL (ref 1.7–7.7)
Neutrophils Relative %: 57 % (ref 43–77)
RDW: 12.5 % (ref 11.5–15.5)
WBC: 6.5 10*3/uL (ref 4.0–10.5)

## 2012-02-20 LAB — BASIC METABOLIC PANEL
BUN: 17 mg/dL (ref 6–23)
Chloride: 99 mEq/L (ref 96–112)
Potassium: 4.3 mEq/L (ref 3.5–5.3)
Sodium: 139 mEq/L (ref 135–145)

## 2012-02-20 LAB — LIPID PANEL
Cholesterol: 167 mg/dL (ref 0–200)
HDL: 43 mg/dL (ref >39)
LDL Cholesterol: 91 mg/dL (ref 0–99)
Total CHOL/HDL Ratio: 3.9 Ratio
Triglycerides: 163 mg/dL — ABNORMAL HIGH (ref <150)
VLDL: 33 mg/dL (ref 0–40)

## 2012-02-20 LAB — HEPATIC FUNCTION PANEL
ALT: 32 U/L (ref 0–53)
Albumin: 4.7 g/dL (ref 3.5–5.2)
Total Protein: 7.5 g/dL (ref 6.0–8.3)

## 2012-02-20 LAB — TSH: TSH: 1.569 u[IU]/mL (ref 0.350–4.500)

## 2012-02-20 NOTE — Patient Instructions (Signed)
Please schedule fasting labs prior to next visit Cbc, chem7, lipid, lft, tsh, ua with reflex micro v70.0 

## 2012-02-21 LAB — URINALYSIS, ROUTINE W REFLEX MICROSCOPIC
Glucose, UA: NEGATIVE mg/dL
Hgb urine dipstick: NEGATIVE
Leukocytes, UA: NEGATIVE
Protein, ur: NEGATIVE mg/dL
Urobilinogen, UA: 1 mg/dL (ref 0.0–1.0)

## 2012-02-21 LAB — URINALYSIS, MICROSCOPIC ONLY: Squamous Epithelial / LPF: NONE SEEN

## 2012-02-22 DIAGNOSIS — Z0000 Encounter for general adult medical examination without abnormal findings: Secondary | ICD-10-CM | POA: Insufficient documentation

## 2012-02-22 DIAGNOSIS — K4090 Unilateral inguinal hernia, without obstruction or gangrene, not specified as recurrent: Secondary | ICD-10-CM | POA: Insufficient documentation

## 2012-02-22 NOTE — Assessment & Plan Note (Signed)
Not noted on today's exam. Provided patient with warning signs to seek immediate medical attention

## 2012-02-22 NOTE — Assessment & Plan Note (Signed)
Normal exam. Obtain CPE labs. Status post influenza vaccine for the season

## 2012-02-22 NOTE — Progress Notes (Signed)
  Subjective:    Patient ID: Keith Walker, male    DOB: 11/29/1985, 26 y.o.   MRN: [id number]  HPI patient presents to clinic for annual exam. History of ulcers colitis followed by GI and now also by rheumatology receiving Remicade. Tolerating medication well. Has quit tobacco. States his urologist told him he had hernia. Denies any bulge or pain.  Past Medical History  Diagnosis Date  . GERD (gastroesophageal reflux disease)   . Ulcer   . Blood in stool   . Ulcerative colitis   . Inguinal hernia     bilateral   Past Surgical History  Procedure Date  . Wisdom tooth extraction   . Colonoscopy     reports that he quit smoking about 12 months ago. His smoking use included Cigarettes. He quit smokeless tobacco use about 12 months ago. His smokeless tobacco use included Snuff. He reports that he drinks alcohol. He reports that he does not use illicit drugs. family history includes Breast cancer in his mother; Diverticulitis in his maternal grandmother; Heart disease in his maternal grandmother, paternal grandfather, and paternal grandmother; Hyperlipidemia in his maternal grandmother; and Lung cancer in his maternal grandfather. No Known Allergies   Review of Systems see history of present illness     Objective:   Physical Exam  Physical Exam  Nursing note and vitals reviewed. Constitutional: He appears well-developed and well-nourished. No distress.  HENT:  Head: Normocephalic and atraumatic.  Right Ear: Tympanic membrane and external ear normal.  Left Ear: Tympanic membrane and external ear normal.  Nose: Nose normal.  Mouth/Throat: Uvula is midline, oropharynx is clear and moist and mucous membranes are normal. No oropharyngeal exudate.  Eyes: Conjunctivae and EOM are normal. Pupils are equal, round, and reactive to light. Right eye exhibits no discharge. Left eye exhibits no discharge. No scleral icterus.  Neck: Neck supple. Carotid bruit is not present. No thyromegaly  present.  Cardiovascular: Normal rate, regular rhythm and normal heart sounds.  Exam reveals no gallop and no friction rub.   No murmur heard. Pulmonary/Chest: Effort normal and breath sounds normal. No respiratory distress. He has no wheezes. He has no rales.  Abdominal: Soft. He exhibits no distension and no mass. There is no hepatosplenomegaly. There is no tenderness. There is no rebound. Hernia confirmed negative in the right inguinal area and confirmed negative in the left inguinal area.  Genitourinary: No evidence of inguinal hernia with Valsalva. No indirect hernia with Valsalva. Lymphadenopathy:    He has no cervical adenopathy.       Right: No inguinal adenopathy present.       Left: No inguinal adenopathy present.  Neurological: He is alert.  Skin: Skin is warm and dry. He is not diaphoretic.  Psychiatric: He has a normal mood and affect.        Assessment & Plan:

## 2012-04-03 ENCOUNTER — Encounter: Payer: Self-pay | Admitting: Internal Medicine

## 2012-04-03 ENCOUNTER — Ambulatory Visit (INDEPENDENT_AMBULATORY_CARE_PROVIDER_SITE_OTHER): Payer: BC Managed Care – PPO | Admitting: Internal Medicine

## 2012-04-03 VITALS — BP 126/88 | HR 62 | Temp 98.2°F | Resp 16 | Ht 66.0 in | Wt 210.1 lb

## 2012-04-03 DIAGNOSIS — G4733 Obstructive sleep apnea (adult) (pediatric): Secondary | ICD-10-CM | POA: Insufficient documentation

## 2012-04-03 DIAGNOSIS — IMO0001 Reserved for inherently not codable concepts without codable children: Secondary | ICD-10-CM

## 2012-04-03 DIAGNOSIS — R0609 Other forms of dyspnea: Secondary | ICD-10-CM

## 2012-04-03 DIAGNOSIS — R0683 Snoring: Secondary | ICD-10-CM

## 2012-04-03 DIAGNOSIS — R030 Elevated blood-pressure reading, without diagnosis of hypertension: Secondary | ICD-10-CM

## 2012-04-03 DIAGNOSIS — R0989 Other specified symptoms and signs involving the circulatory and respiratory systems: Secondary | ICD-10-CM

## 2012-04-03 NOTE — Assessment & Plan Note (Signed)
Multiple potential features of possible osa. Proceed with pulmonary consult

## 2012-04-03 NOTE — Assessment & Plan Note (Signed)
Recommend outpt bp log at and outside of work. Submit log or schedule follow up visit to review

## 2012-04-03 NOTE — Progress Notes (Signed)
  Subjective:    Patient ID: Keith Walker, male    DOB: 18-Aug-1985, 26 y.o.   MRN: [id number]  HPI Pt presents to clinic for evaluation of snoring. His partner notes significant snoring and possibly witnessed apnea. Also notes daytime fatigue. Has not undergone testing for OSA. Has attempted and failed inhaled steroid sprays. BP checks at work have been high. Recalls multiple family members with HTN possibly at an early age. Exercises daily with 45 minutes on the elliptical machine at the gym.   Past Medical History  Diagnosis Date  . GERD (gastroesophageal reflux disease)   . Ulcer   . Blood in stool   . Ulcerative colitis   . Inguinal hernia     bilateral   Past Surgical History  Procedure Date  . Wisdom tooth extraction   . Colonoscopy     reports that he quit smoking about 14 months ago. His smoking use included Cigarettes. He quit smokeless tobacco use about 14 months ago. His smokeless tobacco use included Snuff. He reports that he drinks alcohol. He reports that he does not use illicit drugs. family history includes Breast cancer in his mother; Diverticulitis in his maternal grandmother; Heart disease in his maternal grandmother, paternal grandfather, and paternal grandmother; Hyperlipidemia in his maternal grandmother; and Lung cancer in his maternal grandfather. No Known Allergies   Review of Systems see hpi     Objective:   Physical Exam  Nursing note and vitals reviewed. Constitutional: He appears well-developed and well-nourished. No distress.  HENT:  Head: Normocephalic and atraumatic.  Neurological: He is alert.  Skin: He is not diaphoretic.  Psychiatric: He has a normal mood and affect.          Assessment & Plan:

## 2012-04-12 ENCOUNTER — Institutional Professional Consult (permissible substitution): Payer: BC Managed Care – PPO | Admitting: Internal Medicine

## 2012-04-24 ENCOUNTER — Encounter: Payer: Self-pay | Admitting: Pulmonary Disease

## 2012-04-24 ENCOUNTER — Ambulatory Visit (INDEPENDENT_AMBULATORY_CARE_PROVIDER_SITE_OTHER): Payer: BC Managed Care – PPO | Admitting: Pulmonary Disease

## 2012-04-24 VITALS — BP 114/82 | HR 78 | Temp 98.7°F | Ht 66.0 in | Wt 216.0 lb

## 2012-04-24 DIAGNOSIS — R0609 Other forms of dyspnea: Secondary | ICD-10-CM

## 2012-04-24 DIAGNOSIS — R0683 Snoring: Secondary | ICD-10-CM

## 2012-04-24 NOTE — Patient Instructions (Signed)
Sleep study We discussed options of treatment

## 2012-04-24 NOTE — Assessment & Plan Note (Signed)
Given excessive daytime somnolence, narrow pharyngeal exam, witnessed apneas & loud snoring, obstructive sleep apnea is very likely & an overnight polysomnogram will be scheduled as a split study. The pathophysiology of obstructive sleep apnea , it's cardiovascular consequences & modes of treatment including CPAP were discused with the patient in detail & they evidenced understanding.  

## 2012-04-24 NOTE — Progress Notes (Signed)
Subjective:    Patient ID: Keith Walker, male    DOB: 1985/10/11, 26 y.o.   MRN: [id number]  HPI 26 y.o male with ulcerative colitis & arthritis referred for evaluation of obstructive sleep apnea  He has recent episodes of hypertension. ESS 17/24 His bed partner has noted loud snoring & gasping episodes during sleep. He reports constant daytime fatigue & sleepiness while sitting & reading, watching TV, as a passenger in a car or lying dow to rest in the afternoons. He has tried snoring strips & OTC nasal drops without relief, nasonex helped for a little while but does not last.  Bedtime between 10-11 pm, latency 10-15 mins, sleeps on his side x 1 pillow & 1 body pillow, 2-3 awakenings, denies nocturia, he wakes up at 6 AM but is out of bed only at 645 feeling groggy and occasional headache and dryness of mouth. He is gained 40 pounds in the last 3 years. He drinks 3 cups of coffee a day 2 cups of tea and occasional beverage There is no history suggestive of cataplexy, sleep paralysis or parasomnias   Past Medical History  Diagnosis Date  . GERD (gastroesophageal reflux disease)   . Ulcer   . Blood in stool   . Ulcerative colitis   . Inguinal hernia     bilateral    Past Surgical History  Procedure Date  . Wisdom tooth extraction   . Colonoscopy    No Known Allergies  History   Social History  . Marital Status: Single    Spouse Name: N/A    Number of Children: 0  . Years of Education: N/A   Occupational History  . business analyst    Social History Main Topics  . Smoking status: Former Smoker -- 1.0 packs/day for 8 years    Types: Cigarettes    Quit date: 01/27/2011  . Smokeless tobacco: Former Neurosurgeon    Types: Snuff    Quit date: 01/27/2011     Comment: forme user of1/2 can daily- 1/2 ppd- Quit 01/27/2011  . Alcohol Use: Yes     Comment: occasionally  . Drug Use: No  . Sexually Active: Not on file   Other Topics Concern  . Not on file   Social History  Narrative   Regular exercise: gym dailyCaffeine use: 5 cups of coffee everyday      Review of Systems Constitutional: negative for anorexia, fevers and sweats  Eyes: negative for irritation, redness and visual disturbance  Ears, nose, mouth, throat, and face: negative for earaches, epistaxis, nasal congestion and sore throat  Respiratory: negative for cough, dyspnea on exertion, sputum and wheezing  Cardiovascular: negative for chest pain, dyspnea, lower extremity edema, orthopnea, palpitations and syncope  Gastrointestinal: negative for abdominal pain, constipation, diarrhea, melena, nausea and vomiting  Genitourinary:negative for dysuria, frequency and hematuria  Hematologic/lymphatic: negative for bleeding, easy bruising and lymphadenopathy  Musculoskeletal:negative for arthralgias, muscle weakness and stiff joints  Neurological: negative for coordination problems, gait problems, headaches and weakness  Endocrine: negative for diabetic symptoms including polydipsia, polyuria and weight loss     Objective:   Physical Exam  Gen. Pleasant, obese, in no distress, normal affect ENT - no lesions, no post nasal drip, class 1 airway Neck: No JVD, no thyromegaly, no carotid bruits Lungs: no use of accessory muscles, no dullness to percussion, decreased without rales or rhonchi  Cardiovascular: Rhythm regular, heart sounds  normal, no murmurs or gallops, no peripheral edema Abdomen: soft and non-tender, no hepatosplenomegaly, BS  normal. Musculoskeletal: No deformities, no cyanosis or clubbing Neuro:  alert, non focal, no tremors       Assessment & Plan:

## 2012-05-06 ENCOUNTER — Ambulatory Visit (HOSPITAL_BASED_OUTPATIENT_CLINIC_OR_DEPARTMENT_OTHER): Payer: BC Managed Care – PPO | Attending: Pulmonary Disease

## 2012-05-06 VITALS — Ht 66.0 in | Wt 208.0 lb

## 2012-05-06 DIAGNOSIS — R0609 Other forms of dyspnea: Secondary | ICD-10-CM | POA: Insufficient documentation

## 2012-05-06 DIAGNOSIS — R0989 Other specified symptoms and signs involving the circulatory and respiratory systems: Secondary | ICD-10-CM | POA: Insufficient documentation

## 2012-05-06 DIAGNOSIS — Z79899 Other long term (current) drug therapy: Secondary | ICD-10-CM | POA: Insufficient documentation

## 2012-05-06 DIAGNOSIS — G4733 Obstructive sleep apnea (adult) (pediatric): Secondary | ICD-10-CM

## 2012-05-06 DIAGNOSIS — R0683 Snoring: Secondary | ICD-10-CM

## 2012-05-06 DIAGNOSIS — R5383 Other fatigue: Secondary | ICD-10-CM | POA: Insufficient documentation

## 2012-05-06 DIAGNOSIS — R5381 Other malaise: Secondary | ICD-10-CM | POA: Insufficient documentation

## 2012-05-06 DIAGNOSIS — K5289 Other specified noninfective gastroenteritis and colitis: Secondary | ICD-10-CM | POA: Insufficient documentation

## 2012-05-15 ENCOUNTER — Telehealth: Payer: Self-pay | Admitting: Pulmonary Disease

## 2012-05-15 DIAGNOSIS — R0989 Other specified symptoms and signs involving the circulatory and respiratory systems: Secondary | ICD-10-CM

## 2012-05-15 DIAGNOSIS — R0609 Other forms of dyspnea: Secondary | ICD-10-CM

## 2012-05-15 DIAGNOSIS — G4710 Hypersomnia, unspecified: Secondary | ICD-10-CM

## 2012-05-15 NOTE — Telephone Encounter (Signed)
Pl make appt to discuss results of PSG & options - showed mild OSA

## 2012-05-16 NOTE — Procedures (Signed)
Keith Walker, Keith Walker         ACCOUNT NO.:  [id number]  MEDICAL RECORD NO.:  [id number]          PATIENT TYPE:  OUT  LOCATION:  SLEEP CENTER                 FACILITY:  [facility]  PHYSICIAN:  Oretha Milch, MD      DATE OF BIRTH:  1985/08/13  DATE OF STUDY:  05/06/2012                           NOCTURNAL POLYSOMNOGRAM  REFERRING PHYSICIAN:  Oretha Milch, MD  INDICATION FOR STUDY:  [name] is a 27 year old man with excessive daytime fatigue, loud snoring, talking in his sleep, trouble concentrating.  At the time of this study, he weighed 208 pounds with a height of 6 feet 6 inches, BMI of 34, neck size 17.5 inches.  EPWORTH SLEEPINESS SCORE:  10.  MEDICATIONS:  He takes Remicade for inflammatory bowel disease.  SLEEP ARCHITECTURE:  Lights out was at 10:17 p.m., lights on was at 4:26 a.m., total sleep time was 242 minutes with a sleep period time of 325 minutes and a sleep latency of 43 minutes and a sleep efficiency of 66%. Awake after sleep onset was 84 minutes.  Sleep stages of the percentage of total sleep time was N1 14%, N2 62%, sleep 16%, REM sleep 7%.  Supine sleep accounted for 166 minutes.  Supine REM sleep of 18 minutes.  The longest period of REM sleep was around 3 a.m.  Arousal Data:  There were total of 67 arousals with an arousal index of 16 events per hour.  Of these 54 were spontaneous and the rest were associated with respiratory events.  RESPIRATORY DATA:  There were a total of 1 central apnea, and 14 hypopneas with apnea-hypopnea index of 4 events per hour, 12 RERAs were noted with an RDI of 7 events per hour.  Longest apnea was 10 seconds and longest hypopnea at 31 seconds.  OXYGEN DATA:  The desaturation index was 10 events per hour.  The lowest desaturation was 90%.  He did not spend any time less than 88%.  CARDIAC DATA:  The low heart rate was 35 beats per minute.  The high heart rate recorded was an artifact.  No arrhythmias were  noted.  MOVEMENT-PARASOMNIA:  PLM index was 5.7 events per hour.  The PLM arousal index was 1.3 events per hour.  Discussion:  He was desensitized with a small size mask, but did not have sufficient events to require CPAP.  IMPRESSIONS-RECOMMENDATIONS: 1. No evidence of obstructive sleep apnea.  Few RERAs were noted to     suggest mild upper airway resistance syndrome.  Moderate snoring     was noted. 2. Few PLMs were noted, but these were not associated with arousals,     the significance of this is unknown. 3. No evidence of cardiac arrhythmias or behavioral disturbance during     sleep.  Recommendation: 1. The treatment options for this degree of sleep disorder breathing     include weight loss therapy, nasal steroids can be tried for     snoring. 2. He should be asked to avoid weight gain and medications with     sedative side effects.  He should be cautioned against driving when     sleepy.     Oretha Milch, MD    RVA/MEDQ  D:  05/15/2012 14:09:48  T:  05/16/2012 01:26:29  Job:  [id number]

## 2012-05-16 NOTE — Telephone Encounter (Signed)
I spoke with pt and is scheduled 05/24/12 at 3:30.

## 2012-05-22 ENCOUNTER — Institutional Professional Consult (permissible substitution): Payer: BC Managed Care – PPO | Admitting: Pulmonary Disease

## 2012-05-24 ENCOUNTER — Ambulatory Visit (INDEPENDENT_AMBULATORY_CARE_PROVIDER_SITE_OTHER): Payer: BC Managed Care – PPO | Admitting: Pulmonary Disease

## 2012-05-24 ENCOUNTER — Encounter: Payer: Self-pay | Admitting: Pulmonary Disease

## 2012-05-24 VITALS — BP 130/84 | HR 91 | Temp 98.6°F | Ht 66.0 in | Wt 216.4 lb

## 2012-05-24 DIAGNOSIS — G4733 Obstructive sleep apnea (adult) (pediatric): Secondary | ICD-10-CM

## 2012-05-24 NOTE — Assessment & Plan Note (Addendum)
RDI 7/h but symptoms seem to be out of proportion- also note ESS 17. Also note hypertension & loud snoring & witnessed apneas. Discussed Rx options including oral appliance - he was not able to use mouth guards in the past & prefers to trial CPAP therapy - will use autoCPAP 5-10- we discussed varios mask interfaces & will go with nasal mask Weight loss encouraged, compliance with goal of at least 4-6 hrs every night is the expectation. Advised against medications with sedative side effects Cautioned against driving when sleepy - understanding that sleepiness will vary on a day to day basis

## 2012-05-24 NOTE — Progress Notes (Signed)
  Subjective:    Patient ID: Keith Walker, male    DOB: 09/23/1985, 27 y.o.   MRN: [id number]  HPI  27 y.o male with ulcerative colitis & arthritis for FU of snoring & excessive daytime somnolence   He has recent episodes of hypertension.  ESS 17/24  His bed partner has noted loud snoring & gasping episodes during sleep. He reports constant daytime fatigue & sleepiness while sitting & reading, watching TV, as a passenger in a car or lying dow to rest in the afternoons. He has tried snoring strips & OTC nasal drops without relief, nasonex helped for a little while but does not last.  Bedtime between 10-11 pm, latency 10-15 mins, sleeps on his side x 1 pillow & 1 body pillow, 2-3 awakenings, denies nocturia, he wakes up at 6 AM but is out of bed only at 645 feeling groggy and occasional headache and dryness of mouth. He is gained 40 pounds in the last 3 years.  He drinks 3 cups of coffee a day 2 cups of tea and occasional beverage   PSG showed mild OSA with RDI 7/h with hypopneas & RERAs. Few PLMs were noted , not associated with arousals There is no history suggestive of cataplexy, sleep paralysis or parasomnias     Review of Systems neg for any significant sore throat, dysphagia, itching, sneezing, nasal congestion or excess/ purulent secretions, fever, chills, sweats, unintended wt loss, pleuritic or exertional cp, hempoptysis, orthopnea pnd or change in chronic leg swelling. Also denies presyncope, palpitations, heartburn, abdominal pain, nausea, vomiting, diarrhea or change in bowel or urinary habits, dysuria,hematuria, rash, arthralgias, visual complaints, headache, numbness weakness or ataxia.     Objective:   Physical Exam  Gen. Pleasant, well-nourished, in no distress ENT - no lesions, no post nasal drip Neck: No JVD, no thyromegaly, no carotid bruits Lungs: no use of accessory muscles, no dullness to percussion, clear without rales or rhonchi  Cardiovascular: Rhythm regular,  heart sounds  normal, no murmurs or gallops, no peripheral edema Musculoskeletal: No deformities, no cyanosis or clubbing         Assessment & Plan:

## 2012-05-24 NOTE — Patient Instructions (Signed)
We discussed various options- trial of cpap with nasal mask

## 2012-07-05 ENCOUNTER — Ambulatory Visit: Payer: BC Managed Care – PPO | Admitting: Pulmonary Disease

## 2012-08-03 ENCOUNTER — Ambulatory Visit: Payer: BC Managed Care – PPO | Admitting: Pulmonary Disease

## 2012-08-30 ENCOUNTER — Ambulatory Visit: Payer: BC Managed Care – PPO | Admitting: Pulmonary Disease

## 2012-09-28 ENCOUNTER — Ambulatory Visit: Payer: BC Managed Care – PPO | Admitting: Pulmonary Disease

## 2012-11-02 ENCOUNTER — Ambulatory Visit: Payer: BC Managed Care – PPO | Admitting: Pulmonary Disease

## 2013-01-07 ENCOUNTER — Ambulatory Visit: Payer: BC Managed Care – PPO | Admitting: Pulmonary Disease

## 2013-01-30 ENCOUNTER — Encounter: Payer: Self-pay | Admitting: Internal Medicine

## 2013-02-22 ENCOUNTER — Encounter: Payer: BC Managed Care – PPO | Admitting: Internal Medicine

## 2013-03-25 ENCOUNTER — Encounter: Payer: BC Managed Care – PPO | Admitting: Physician Assistant

## 2013-03-27 ENCOUNTER — Encounter: Payer: Self-pay | Admitting: Physician Assistant

## 2013-03-27 ENCOUNTER — Ambulatory Visit (INDEPENDENT_AMBULATORY_CARE_PROVIDER_SITE_OTHER): Payer: BC Managed Care – PPO | Admitting: Physician Assistant

## 2013-03-27 ENCOUNTER — Other Ambulatory Visit: Payer: Self-pay | Admitting: Physician Assistant

## 2013-03-27 VITALS — BP 112/88 | HR 87 | Temp 98.5°F | Resp 16 | Ht 65.25 in | Wt 190.8 lb

## 2013-03-27 DIAGNOSIS — F411 Generalized anxiety disorder: Secondary | ICD-10-CM

## 2013-03-27 DIAGNOSIS — Z113 Encounter for screening for infections with a predominantly sexual mode of transmission: Secondary | ICD-10-CM

## 2013-03-27 DIAGNOSIS — G4733 Obstructive sleep apnea (adult) (pediatric): Secondary | ICD-10-CM

## 2013-03-27 DIAGNOSIS — Z299 Encounter for prophylactic measures, unspecified: Secondary | ICD-10-CM

## 2013-03-27 DIAGNOSIS — Z0000 Encounter for general adult medical examination without abnormal findings: Secondary | ICD-10-CM

## 2013-03-27 DIAGNOSIS — F419 Anxiety disorder, unspecified: Secondary | ICD-10-CM

## 2013-03-27 LAB — HEPATIC FUNCTION PANEL
ALT: 29 U/L (ref 0–53)
Albumin: 4.2 g/dL (ref 3.5–5.2)
Alkaline Phosphatase: 61 U/L (ref 39–117)
Indirect Bilirubin: 0.7 mg/dL (ref 0.0–0.9)
Total Protein: 7.4 g/dL (ref 6.0–8.3)

## 2013-03-27 LAB — CBC WITH DIFFERENTIAL/PLATELET
Eosinophils Relative: 2 % (ref 0–5)
Hemoglobin: 17.1 g/dL — ABNORMAL HIGH (ref 13.0–17.0)
Lymphocytes Relative: 26 % (ref 12–46)
Lymphs Abs: 1.4 10*3/uL (ref 0.7–4.0)
MCV: 93.3 fL (ref 78.0–100.0)
Monocytes Relative: 7 % (ref 3–12)
Neutrophils Relative %: 65 % (ref 43–77)
Platelets: 253 10*3/uL (ref 150–400)
RBC: 5.21 MIL/uL (ref 4.22–5.81)
WBC: 5.2 10*3/uL (ref 4.0–10.5)

## 2013-03-27 LAB — BASIC METABOLIC PANEL
CO2: 28 mEq/L (ref 19–32)
Chloride: 101 mEq/L (ref 96–112)
Glucose, Bld: 83 mg/dL (ref 70–99)
Sodium: 137 mEq/L (ref 135–145)

## 2013-03-27 NOTE — Assessment & Plan Note (Signed)
Patient wishes to begin PReP for HIV giving his sexual orientation.  Discussed high-risk sex behaviors and counseled patient on condom use and screening for HIV and other STDs.  Will obtain labs to include STD screen, as well as kidney/liver function before starting medication.  Patient understands he will have to follow-up every 3 months with labs while on medication.

## 2013-03-27 NOTE — Assessment & Plan Note (Signed)
Physical exam within normal limits.  Discussed importance of dental and optometry visits.  UTD on immunizations.  Will obtain fasting labs at today's visit.

## 2013-03-27 NOTE — Assessment & Plan Note (Signed)
Patient followed by Rheumatologist, Dr. Marzetta Merino.  Symptoms controlled with Remicade.  Occasional use of Fleceril for muscle spasms when between infusions.

## 2013-03-27 NOTE — Patient Instructions (Signed)
Please obtain labs.  I will call you with your results.  We will start PreP if labs look good.

## 2013-03-27 NOTE — Assessment & Plan Note (Signed)
Patient endorses symptoms controlled with Cymbalta and occasional 0.5 mg xanax for breakthrough anxiety.  Continue current regimen with goal of weaning off of xanax.

## 2013-03-27 NOTE — Assessment & Plan Note (Signed)
Patient endorses 25 lb weight loss.  Is not currently using CPAP machine at bedtime.  BP stable.

## 2013-03-27 NOTE — Progress Notes (Signed)
Patient ID: Keith Walker, male   DOB: 1985-05-28, 27 y.o.   MRN: [id number]  Patient presents to clinic today for annual exam.  Acute Concerns: Patient wishes to discuss pre-exposure prophylaxis for HIV infection.  Patient is within the MSM population, engaging in both receptive and penetrating anal intercourse. Patient endorses that he and his partner occasionally bring another person into the bedroom.  He does endorse condom use when engaging in sexual activity with a "third" person.  Denies condom use when engaging in sexual activity with his partner.  Endorses recent STD panel which was negative.  Uses [facility] HIV test -- last test 2 weeks ago and non-reactive.  Discussed cost of PReP with patient as well as necessity of checking kidney function and HIV status prior to initiation of medication.  Chronic Issues: (1) Obstructive Sleep Apnea --  Has CPAP mask but has not been using it.  No daytime somnolence.  Endorses 25+ weight loss with diet and exercise.  BP good at 112/88 at today's visit.  (2) Ulcerative Colitis -- Followed by Rheumatology.  Patient on Remicade.  Denies bloody or mucoid stools.  Denies current flare.  (3) Fibromyalgia -- Followed by Rheumatology.  Patient is currently on Cymbalta and Norco prescribed by Rheumatology.  Asymptomatic at present.  (4) Anxiety/Depression -- Is prescribed Xanax and Cymbalta by Rheumatologist.  Denies depressed mood or anhedonia.  Occasional anxiety for which he takes 0.5 mg of xanax.  Discussed that we would be happy to take over management of these medications in the future if patient desires.  Health Maintenance: Dental -- UTD Vision -- UTD Immunizations -- reports UTD.    Past Medical History  Diagnosis Date  . GERD (gastroesophageal reflux disease)   . Ulcer   . Blood in stool   . Ulcerative colitis   . Inguinal hernia     bilateral  . Fibromyalgia   . Arthritis   . Insomnia     d/t pain  . Chicken pox     Current  Outpatient Prescriptions on File Prior to Visit  Medication Sig Dispense Refill  . InFLIXimab (REMICADE IV) Inject 6 vials into the vein every 30 (thirty) days. Every 4 weeks.       No current facility-administered medications on file prior to visit.    No Known Allergies  Family History  Problem Relation Age of Onset  . Breast cancer Mother   . Hyperlipidemia Maternal Grandmother   . Heart disease Maternal Grandmother   . Lung cancer Maternal Grandfather   . Heart disease Paternal Grandfather   . Heart disease Paternal Grandmother   . Diverticulitis Maternal Grandmother   . Diabetes Maternal Aunt   . Heart disease Paternal Uncle   . Healthy Brother     x3  . Healthy Sister     x2    History   Social History  . Marital Status: Single    Spouse Name: N/A    Number of Children: 0  . Years of Education: N/A   Occupational History  . business analyst    Social History Main Topics  . Smoking status: Former Smoker -- 1.00 packs/day for 8 years    Types: Cigarettes    Quit date: 01/27/2011  . Smokeless tobacco: Former Neurosurgeon    Types: Snuff    Quit date: 01/27/2011     Comment: forme user of1/2 can daily- 1/2 ppd- Quit 01/27/2011  . Alcohol Use: 0.6 oz/week    1 Glasses of wine  per week     Comment: occasionally   . Drug Use: No  . Sexual Activity: Yes    Birth Control/ Protection: Condom     Comment: male   Other Topics Concern  . None   Social History Narrative   Regular exercise: gym daily   Caffeine use: 5 cups of coffee everyday    Review of Systems  Constitutional: Negative for fever and weight loss.  HENT: Negative for ear discharge, ear pain, hearing loss and tinnitus.   Eyes: Negative for blurred vision, double vision, photophobia and pain.  Respiratory: Negative for cough and shortness of breath.   Cardiovascular: Positive for palpitations. Negative for chest pain.  Gastrointestinal: Positive for abdominal pain. Negative for heartburn, nausea,  vomiting, diarrhea, constipation, blood in stool and melena.  Genitourinary: Negative for dysuria, urgency, frequency, hematuria and flank pain.  Neurological: Negative for dizziness, seizures, loss of consciousness and headaches.  Endo/Heme/Allergies: Positive for environmental allergies.  Psychiatric/Behavioral: Positive for depression. Negative for suicidal ideas, hallucinations and substance abuse. The patient is nervous/anxious. The patient does not have insomnia.    Filed Vitals:   03/27/13 0934  BP: 112/88  Pulse: 87  Temp: 98.5 F (36.9 C)  Resp: 16   Physical Exam  Vitals reviewed. Constitutional: He is oriented to person, place, and time and well-developed, well-nourished, and in no distress.  HENT:  Head: Normocephalic and atraumatic.  Right Ear: External ear normal.  Left Ear: External ear normal.  Nose: Nose normal.  Mouth/Throat: Oropharynx is clear and moist. No oropharyngeal exudate.  Tympanic membranes within normal limits.  Eyes: Conjunctivae are normal. Pupils are equal, round, and reactive to light.  Neck: Neck supple.  Cardiovascular: Normal rate, regular rhythm, normal heart sounds and intact distal pulses.   Pulmonary/Chest: Effort normal and breath sounds normal. No respiratory distress. He has no wheezes. He has no rales. He exhibits no tenderness.  Abdominal: Soft. Bowel sounds are normal. He exhibits no distension and no mass. There is no tenderness. There is no rebound and no guarding.  Genitourinary: Penis normal.  Musculoskeletal: Normal range of motion.  Lymphadenopathy:    He has no cervical adenopathy.  Neurological: He is alert and oriented to person, place, and time. No cranial nerve deficit.  Skin: Skin is warm and dry. No rash noted.  Psychiatric: Affect normal.   Assessment/Plan: OSA (obstructive sleep apnea) Patient endorses 25 lb weight loss.  Is not currently using CPAP machine at bedtime.  BP stable.    Ulcerative colitis Patient  followed by Rheumatologist, Dr. Marzetta Merino.  Symptoms controlled with Remicade.  Occasional use of Fleceril for muscle spasms when between infusions.  Annual physical exam Physical exam within normal limits.  Discussed importance of dental and optometry visits.  UTD on immunizations.  Will obtain fasting labs at today's visit.  Anxiety Patient endorses symptoms controlled with Cymbalta and occasional 0.5 mg xanax for breakthrough anxiety.  Continue current regimen with goal of weaning off of xanax.  Need for prophylactic measure Patient wishes to begin PReP for HIV giving his sexual orientation.  Discussed high-risk sex behaviors and counseled patient on condom use and screening for HIV and other STDs.  Will obtain labs to include STD screen, as well as kidney/liver function before starting medication.  Patient understands he will have to follow-up every 3 months with labs while on medication.

## 2013-03-27 NOTE — Progress Notes (Signed)
Pre visit review using our clinic review tool, if applicable. No additional management support is needed unless otherwise documented below in the visit note/SLS  

## 2013-03-28 LAB — URINALYSIS, ROUTINE W REFLEX MICROSCOPIC
Bilirubin Urine: NEGATIVE
Glucose, UA: NEGATIVE mg/dL
Hgb urine dipstick: NEGATIVE
Ketones, ur: NEGATIVE mg/dL
Protein, ur: NEGATIVE mg/dL

## 2013-03-28 LAB — URINALYSIS, MICROSCOPIC ONLY
Bacteria, UA: NONE SEEN
Squamous Epithelial / LPF: NONE SEEN

## 2013-03-28 LAB — HIV ANTIBODY (ROUTINE TESTING W REFLEX): HIV: NONREACTIVE

## 2013-03-28 LAB — ACUTE HEP PANEL AND HEP B SURFACE AB
Hep A IgM: NONREACTIVE
Hep B C IgM: NONREACTIVE
Hep B S Ab: POSITIVE — AB
Hepatitis B Surface Ag: NEGATIVE

## 2013-03-29 ENCOUNTER — Telehealth: Payer: Self-pay | Admitting: Physician Assistant

## 2013-03-29 DIAGNOSIS — Z299 Encounter for prophylactic measures, unspecified: Secondary | ICD-10-CM

## 2013-03-29 MED ORDER — EMTRICITABINE-TENOFOVIR DF 200-300 MG PO TABS: 1.0000 | ORAL_TABLET | Freq: Every day | ORAL | Status: DC

## 2013-03-29 NOTE — Telephone Encounter (Signed)
Please inform patient that all results are in and look good.  I will send in Rx for Truvada for PReP.  I do want to see Keith Walker in ~ 1 month to assess how he is tolerating medication.  Addendum -- Spoke with patient personally.  Patient informed.  Follow-up in 1 month.

## 2013-06-17 ENCOUNTER — Other Ambulatory Visit: Payer: Self-pay | Admitting: Physician Assistant

## 2013-06-17 NOTE — Telephone Encounter (Signed)
Informed patient of medication refill and he scheduled appointment for 07/03/13

## 2013-06-17 NOTE — Telephone Encounter (Signed)
Truvada refill sent to mail order. [facility]  Please call pt to arrange appt.

## 2013-06-19 ENCOUNTER — Encounter: Payer: Self-pay | Admitting: Physician Assistant

## 2013-06-19 ENCOUNTER — Ambulatory Visit (INDEPENDENT_AMBULATORY_CARE_PROVIDER_SITE_OTHER): Payer: BC Managed Care – PPO | Admitting: Physician Assistant

## 2013-06-19 VITALS — BP 122/78 | HR 97 | Temp 98.2°F | Resp 16 | Ht 66.0 in | Wt 206.5 lb

## 2013-06-19 DIAGNOSIS — J019 Acute sinusitis, unspecified: Secondary | ICD-10-CM

## 2013-06-19 DIAGNOSIS — Z299 Encounter for prophylactic measures, unspecified: Secondary | ICD-10-CM

## 2013-06-19 LAB — COMPREHENSIVE METABOLIC PANEL
ALT: 20 U/L (ref 0–53)
AST: 21 U/L (ref 0–37)
Albumin: 4 g/dL (ref 3.5–5.2)
Alkaline Phosphatase: 65 U/L (ref 39–117)
BILIRUBIN TOTAL: 0.3 mg/dL (ref 0.2–1.2)
BUN: 11 mg/dL (ref 6–23)
CO2: 29 mEq/L (ref 19–32)
Calcium: 8.9 mg/dL (ref 8.4–10.5)
Chloride: 103 mEq/L (ref 96–112)
Creat: 0.71 mg/dL (ref 0.50–1.35)
Glucose, Bld: 69 mg/dL — ABNORMAL LOW (ref 70–99)
Potassium: 4.3 mEq/L (ref 3.5–5.3)
Sodium: 139 mEq/L (ref 135–145)
Total Protein: 6.6 g/dL (ref 6.0–8.3)

## 2013-06-19 MED ORDER — AZITHROMYCIN 250 MG PO TABS: ORAL_TABLET | ORAL | Status: DC

## 2013-06-19 MED ORDER — FLUTICASONE PROPIONATE 50 MCG/ACT NA SUSP: 2.0000 | Freq: Every day | NASAL | Status: DC

## 2013-06-19 NOTE — Progress Notes (Signed)
Pre visit review using our clinic review tool, if applicable. No additional management support is needed unless otherwise documented below in the visit note/SLS  

## 2013-06-19 NOTE — Patient Instructions (Signed)
Please go to lab. I will call you with your results.  Continue Truvada as prescribed.  Take antibiotic as prescribed.  Increase fluid intake.  Rest.  Saline nasal spray. Mucinex. Humidifier in bedroom. Please call or return to clinic if symptoms are not improving.  Sinusitis Sinusitis is redness, soreness, and swelling (inflammation) of the paranasal sinuses. Paranasal sinuses are air pockets within the bones of your face (beneath the eyes, the middle of the forehead, or above the eyes). In healthy paranasal sinuses, mucus is able to drain out, and air is able to circulate through them by way of your nose. However, when your paranasal sinuses are inflamed, mucus and air can become trapped. This can allow bacteria and other germs to grow and cause infection. Sinusitis can develop quickly and last only a short time (acute) or continue over a long period (chronic). Sinusitis that lasts for more than 12 weeks is considered chronic.  CAUSES  Causes of sinusitis include:  Allergies.  Structural abnormalities, such as displacement of the cartilage that separates your nostrils (deviated septum), which can decrease the air flow through your nose and sinuses and affect sinus drainage.  Functional abnormalities, such as when the small hairs (cilia) that line your sinuses and help remove mucus do not work properly or are not present. SYMPTOMS  Symptoms of acute and chronic sinusitis are the same. The primary symptoms are pain and pressure around the affected sinuses. Other symptoms include:  Upper toothache.  Earache.  Headache.  Bad breath.  Decreased sense of smell and taste.  A cough, which worsens when you are lying flat.  Fatigue.  Fever.  Thick drainage from your nose, which often is green and may contain pus (purulent).  Swelling and warmth over the affected sinuses. DIAGNOSIS  Your caregiver will perform a physical exam. During the exam, your caregiver may:  Look in your nose for  signs of abnormal growths in your nostrils (nasal polyps).  Tap over the affected sinus to check for signs of infection.  View the inside of your sinuses (endoscopy) with a special imaging device with a light attached (endoscope), which is inserted into your sinuses. If your caregiver suspects that you have chronic sinusitis, one or more of the following tests may be recommended:  Allergy tests.  Nasal culture A sample of mucus is taken from your nose and sent to a lab and screened for bacteria.  Nasal cytology A sample of mucus is taken from your nose and examined by your caregiver to determine if your sinusitis is related to an allergy. TREATMENT  Most cases of acute sinusitis are related to a viral infection and will resolve on their own within 10 days. Sometimes medicines are prescribed to help relieve symptoms (pain medicine, decongestants, nasal steroid sprays, or saline sprays).  However, for sinusitis related to a bacterial infection, your caregiver will prescribe antibiotic medicines. These are medicines that will help kill the bacteria causing the infection.  Rarely, sinusitis is caused by a fungal infection. In theses cases, your caregiver will prescribe antifungal medicine. For some cases of chronic sinusitis, surgery is needed. Generally, these are cases in which sinusitis recurs more than 3 times per year, despite other treatments. HOME CARE INSTRUCTIONS   Drink plenty of water. Water helps thin the mucus so your sinuses can drain more easily.  Use a humidifier.  Inhale steam 3 to 4 times a day (for example, sit in the bathroom with the shower running).  Apply a warm, moist washcloth  to your face 3 to 4 times a day, or as directed by your caregiver.  Use saline nasal sprays to help moisten and clean your sinuses.  Take over-the-counter or prescription medicines for pain, discomfort, or fever only as directed by your caregiver. SEEK IMMEDIATE MEDICAL CARE IF:  You have  increasing pain or severe headaches.  You have nausea, vomiting, or drowsiness.  You have swelling around your face.  You have vision problems.  You have a stiff neck.  You have difficulty breathing. MAKE SURE YOU:   Understand these instructions.  Will watch your condition.  Will get help right away if you are not doing well or get worse. Document Released: 04/11/2005 Document Revised: 07/04/2011 Document Reviewed: 04/26/2011 [facility] Patient Information 2014 [place], [place].

## 2013-06-20 DIAGNOSIS — J019 Acute sinusitis, unspecified: Secondary | ICD-10-CM | POA: Insufficient documentation

## 2013-06-20 DIAGNOSIS — Z299 Encounter for prophylactic measures, unspecified: Secondary | ICD-10-CM | POA: Insufficient documentation

## 2013-06-20 LAB — HIV ANTIBODY (ROUTINE TESTING W REFLEX): HIV: NONREACTIVE

## 2013-06-20 NOTE — Assessment & Plan Note (Signed)
Patient tolerating Truvada. Repeat CMP and HIV antibody. Patient to continue medication as directed. Reminded patient that preexposure prophylactic therapy is not a free pass to engage in high risk sexual behavior. Consistent and correct condom use should be followed with any sexual encounter.  Number of sexual encounters, increases risk for HIV. Provided does not protect against other STDs. Patient voices understanding.

## 2013-06-20 NOTE — Progress Notes (Signed)
Patient presents to clinic today for followup.  Patient was placed on Truvada for preexposure prophylaxis of HIV. Patient states he is taking the Truvada as directed. Denies side effects. Patient is still sexually active and engages in both receptive and penetrative anal intercourse. Patient uses condoms "most" of the time. Does not use condoms when engaging in sexual activity with his partner. Patient is due for followup labs including HIV antibody.  Patient also presents to the clinic today complaining of several weeks of sinus pressure, sinus pain, ear pain, postnasal drip and scratchy throat. Patient denies fever, chills, teeth pain, tinnitus or ear drainage. Patient denies recent travel or sick contact. Has tried over-the-counter remedies with no relief of symptoms.  Past Medical History  Diagnosis Date  . GERD (gastroesophageal reflux disease)   . Ulcer   . Blood in stool   . Ulcerative colitis   . Inguinal hernia     bilateral  . Fibromyalgia   . Arthritis   . Insomnia     d/t pain  . Chicken pox     Current Outpatient Prescriptions on File Prior to Visit  Medication Sig Dispense Refill  . ALPRAZolam (XANAX) 1 MG tablet Take 1 mg by mouth 2 (two) times daily as needed for anxiety or sleep.      . cyclobenzaprine (FLEXERIL) 10 MG tablet Take 10 mg by mouth 2 (two) times daily as needed for muscle spasms.      . DULoxetine (CYMBALTA) 60 MG capsule Take 60 mg by mouth daily.      Marland Kitchen HYDROcodone-acetaminophen (NORCO) 10-325 MG per tablet Take 1 tablet by mouth as needed.      . InFLIXimab (REMICADE IV) Inject 6 vials into the vein every 30 (thirty) days. Every 4 weeks.      . TRUVADA 200-300 MG per tablet TAKE 1 TABLET BY MOUTH    DAILY  90 tablet  0  . vitamin C (ASCORBIC ACID) 500 MG tablet Take 500 mg by mouth daily.       No current facility-administered medications on file prior to visit.    No Known Allergies  Family History  Problem Relation Age of Onset  . Breast cancer  Mother   . Hyperlipidemia Maternal Grandmother   . Heart disease Maternal Grandmother   . Lung cancer Maternal Grandfather   . Heart disease Paternal Grandfather   . Heart disease Paternal Grandmother   . Diverticulitis Maternal Grandmother   . Diabetes Maternal Aunt   . Heart disease Paternal Uncle   . Healthy Brother     x3  . Healthy Sister     x2    History   Social History  . Marital Status: Single    Spouse Name: N/A    Number of Children: 0  . Years of Education: N/A   Occupational History  . business analyst    Social History Main Topics  . Smoking status: Former Smoker -- 1.00 packs/day for 8 years    Types: Cigarettes    Quit date: 01/27/2011  . Smokeless tobacco: Former Systems developer    Types: Snuff    Quit date: 01/27/2011     Comment: forme user of1/2 can daily- 1/2 ppd- Quit 01/27/2011  . Alcohol Use: 0.6 oz/week    1 Glasses of wine per week     Comment: occasionally   . Drug Use: No  . Sexual Activity: Yes    Birth Control/ Protection: Condom     Comment: male   Other  Topics Concern  . None   Social History Narrative   Regular exercise: gym daily   Caffeine use: 5 cups of coffee everyday     Review of Systems - See HPI.  All other ROS are negative.  BP 122/78  Pulse 97  Temp(Src) 98.2 F (36.8 C) (Oral)  Resp 16  Ht 5' 6"  (1.676 m)  Wt 206 lb 8 oz (93.668 kg)  BMI 33.35 kg/m2  SpO2 99%  Physical Exam  Vitals reviewed. Constitutional: He is oriented to person, place, and time and well-developed, well-nourished, and in no distress.  HENT:  Head: Normocephalic and atraumatic.  Right Ear: Tympanic membrane, external ear and ear canal normal.  Left Ear: Tympanic membrane, external ear and ear canal normal.  Nose: Right sinus exhibits maxillary sinus tenderness and frontal sinus tenderness. Left sinus exhibits frontal sinus tenderness. Left sinus exhibits no maxillary sinus tenderness.  Mouth/Throat: Uvula is midline, oropharynx is clear and moist  and mucous membranes are normal. No oropharyngeal exudate, posterior oropharyngeal edema, posterior oropharyngeal erythema or tonsillar abscesses.  Eyes: Conjunctivae are normal. Pupils are equal, round, and reactive to light.  Neck: Neck supple.  Cardiovascular: Normal rate, regular rhythm and normal heart sounds.   Pulmonary/Chest: Effort normal and breath sounds normal. No respiratory distress. He has no wheezes. He has no rales. He exhibits no tenderness.  Lymphadenopathy:    He has no cervical adenopathy.  Neurological: He is alert and oriented to person, place, and time.  Skin: Skin is warm and dry. No rash noted.  Psychiatric: Affect normal.    Recent Results (from the past 2160 hour(s))  CBC WITH DIFFERENTIAL     Status: Abnormal   Collection Time    03/27/13 10:34 AM      Result Value Ref Range   WBC 5.2  4.0 - 10.5 K/uL   RBC 5.21  4.22 - 5.81 MIL/uL   Hemoglobin 17.1 (*) 13.0 - 17.0 g/dL   HCT 48.6  39.0 - 52.0 %   MCV 93.3  78.0 - 100.0 fL   MCH 32.8  26.0 - 34.0 pg   MCHC 35.2  30.0 - 36.0 g/dL   RDW 13.7  11.5 - 15.5 %   Platelets 253  150 - 400 K/uL   Neutrophils Relative % 65  43 - 77 %   Neutro Abs 3.4  1.7 - 7.7 K/uL   Lymphocytes Relative 26  12 - 46 %   Lymphs Abs 1.4  0.7 - 4.0 K/uL   Monocytes Relative 7  3 - 12 %   Monocytes Absolute 0.3  0.1 - 1.0 K/uL   Eosinophils Relative 2  0 - 5 %   Eosinophils Absolute 0.1  0.0 - 0.7 K/uL   Basophils Relative 0  0 - 1 %   Basophils Absolute 0.0  0.0 - 0.1 K/uL   Smear Review Criteria for review not met    BASIC METABOLIC PANEL     Status: None   Collection Time    03/27/13 10:34 AM      Result Value Ref Range   Sodium 137  135 - 145 mEq/L   Potassium 4.4  3.5 - 5.3 mEq/L   Chloride 101  96 - 112 mEq/L   CO2 28  19 - 32 mEq/L   Glucose, Bld 83  70 - 99 mg/dL   BUN 16  6 - 23 mg/dL   Creat 0.82  0.50 - 1.35 mg/dL   Calcium 9.7  8.4 -  10.5 mg/dL  HEPATIC FUNCTION PANEL     Status: None   Collection Time     03/27/13 10:34 AM      Result Value Ref Range   Total Bilirubin 0.9  0.3 - 1.2 mg/dL   Bilirubin, Direct 0.2  0.0 - 0.3 mg/dL   Indirect Bilirubin 0.7  0.0 - 0.9 mg/dL   Alkaline Phosphatase 61  39 - 117 U/L   AST 24  0 - 37 U/L   ALT 29  0 - 53 U/L   Total Protein 7.4  6.0 - 8.3 g/dL   Albumin 4.2  3.5 - 5.2 g/dL  TSH     Status: None   Collection Time    03/27/13 10:34 AM      Result Value Ref Range   TSH 1.493  0.350 - 4.500 uIU/mL  URINALYSIS, ROUTINE W REFLEX MICROSCOPIC     Status: Abnormal   Collection Time    03/27/13 10:34 AM      Result Value Ref Range   Color, Urine YELLOW  YELLOW   APPearance TURBID (*) CLEAR   Specific Gravity, Urine 1.025  1.005 - 1.030   pH 6.0  5.0 - 8.0   Glucose, UA NEG  NEG mg/dL   Bilirubin Urine NEG  NEG   Ketones, ur NEG  NEG mg/dL   Hgb urine dipstick NEG  NEG   Protein, ur NEG  NEG mg/dL   Urobilinogen, UA 1  0.0 - 1.0 mg/dL   Nitrite NEG  NEG   Leukocytes, UA NEG  NEG  GC/CHLAMYDIA PROBE AMP, URINE     Status: None   Collection Time    03/27/13 10:34 AM      Result Value Ref Range   Chlamydia, Swab/Urine, PCR NEGATIVE  NEGATIVE   Comment:                          **Normal Reference Range: Negative**                  Assay performed using the Gen-Probe APTIMA COMBO2 (R) Assay.         GC Probe Amp, Urine NEGATIVE  NEGATIVE   Comment:                          **Normal Reference Range: Negative**                  Assay performed using the Gen-Probe APTIMA COMBO2 (R) Assay.        RPR     Status: None   Collection Time    03/27/13 10:34 AM      Result Value Ref Range   RPR NON REAC  NON REAC  HIV ANTIBODY (ROUTINE TESTING)     Status: None   Collection Time    03/27/13 10:34 AM      Result Value Ref Range   HIV NON REACTIVE  NON REACTIVE  ACUTE HEP PANEL AND HEP B SURFACE AB     Status: Abnormal   Collection Time    03/27/13 10:34 AM      Result Value Ref Range   Hepatitis B Surface Ag NEGATIVE  NEGATIVE   Hep B C IgM  NON REACTIVE  NON REACTIVE   Comment: High levels of Hepatitis B Core IgM antibody are detectable     during the acute stage of Hepatitis B. This antibody is used  to differentiate current from past HBV infection.         Hep B S Ab POS (*) NEGATIVE   Hep A IgM NON REACTIVE  NON REACTIVE   HCV Ab NEGATIVE  NEGATIVE  HIV-1 RNA ULTRAQUANT REFLEX TO GENTYP+     Status: None   Collection Time    03/27/13 10:34 AM      Result Value Ref Range   HIV 1 RNA Quant <20  <20 copies/mL   Comment: HIV Genotype cannot be performed due to low viral load.   HIV1 RNA Quant, Log <1.30  <1.30 log 10   Comment:       HIV Genotype will reflex if the HIV Quant is 1000 copies/mL or higher.           This test utilizes the [place] FDA approved Roche HIV-1 Test Kit by RT-PCR.        URINALYSIS, MICROSCOPIC ONLY     Status: None   Collection Time    03/27/13 10:34 AM      Result Value Ref Range   Squamous Epithelial / LPF NONE SEEN  RARE   Crystals NONE SEEN  NONE SEEN   Casts NONE SEEN  NONE SEEN   WBC, UA 0-2  <3 WBC/hpf   RBC / HPF 0-2  <3 RBC/hpf   Bacteria, UA NONE SEEN  RARE  COMPREHENSIVE METABOLIC PANEL     Status: Abnormal   Collection Time    06/19/13  4:27 PM      Result Value Ref Range   Sodium 139  135 - 145 mEq/L   Potassium 4.3  3.5 - 5.3 mEq/L   Chloride 103  96 - 112 mEq/L   CO2 29  19 - 32 mEq/L   Glucose, Bld 69 (*) 70 - 99 mg/dL   BUN 11  6 - 23 mg/dL   Creat 0.71  0.50 - 1.35 mg/dL   Total Bilirubin 0.3  0.2 - 1.2 mg/dL   Comment: ** Please note change in reference range(s). **   Alkaline Phosphatase 65  39 - 117 U/L   AST 21  0 - 37 U/L   ALT 20  0 - 53 U/L   Total Protein 6.6  6.0 - 8.3 g/dL   Albumin 4.0  3.5 - 5.2 g/dL   Calcium 8.9  8.4 - 10.5 mg/dL  HIV ANTIBODY (ROUTINE TESTING)     Status: None   Collection Time    06/19/13  4:27 PM      Result Value Ref Range   HIV NON REACTIVE  NON REACTIVE    Assessment/Plan: Acute sinusitis with symptoms > 10 days Rx  azithromycin. Increase fluid intake. Rest. Saline nasal spray. Probiotic. Mucinex. Humidifier in bedroom. Call or return to clinic if symptoms not improving.  Prophylactic measure Patient tolerating Truvada. Repeat CMP and HIV antibody. Patient to continue medication as directed. Reminded patient that preexposure prophylactic therapy is not a free pass to engage in high risk sexual behavior. Consistent and correct condom use should be followed with any sexual encounter.  Number of sexual encounters, increases risk for HIV. Provided does not protect against other STDs. Patient voices understanding.

## 2013-06-20 NOTE — Assessment & Plan Note (Signed)
Rx azithromycin. Increase fluid intake. Rest. Saline nasal spray. Probiotic. Mucinex. Humidifier in bedroom. Call or return to clinic if symptoms not improving.

## 2013-06-24 ENCOUNTER — Other Ambulatory Visit: Payer: Self-pay | Admitting: Physician Assistant

## 2013-06-24 ENCOUNTER — Telehealth: Payer: Self-pay | Admitting: Physician Assistant

## 2013-06-24 DIAGNOSIS — J329 Chronic sinusitis, unspecified: Secondary | ICD-10-CM

## 2013-06-24 MED ORDER — AZITHROMYCIN 250 MG PO TABS: ORAL_TABLET | ORAL | Status: DC

## 2013-06-24 NOTE — Telephone Encounter (Signed)
Patient had sent me a MyChart message with the same concerns.  I have already responded to this.  Patient instructed that while tooth pain is a sign of sinus infection, jaw pain is not typically an associated symptom.  Instructed patient to continue antibiotic for his sinus infection, but he should make an appointment to see either myself or another provider to reassess his symptoms.  Will you please call patient to reiterate this message.

## 2013-06-24 NOTE — Telephone Encounter (Signed)
Patient is requesting last lab results. Also, he states that he doesn't think the antibiotic is strong enough. He says that his jaw is swollen and is painful, especially when he eats. He wants to know if he needs stronger meds?

## 2013-06-24 NOTE — Telephone Encounter (Signed)
Also his labs are normal.  HIV is negative.  Continue Truvada.

## 2013-06-25 NOTE — Telephone Encounter (Signed)
Notified pt and he voices understanding. 

## 2013-07-03 ENCOUNTER — Ambulatory Visit: Payer: BC Managed Care – PPO | Admitting: Physician Assistant

## 2013-08-07 ENCOUNTER — Ambulatory Visit (INDEPENDENT_AMBULATORY_CARE_PROVIDER_SITE_OTHER): Payer: BC Managed Care – PPO | Admitting: Physician Assistant

## 2013-08-07 ENCOUNTER — Encounter: Payer: Self-pay | Admitting: Physician Assistant

## 2013-08-07 VITALS — BP 118/88 | HR 105 | Temp 98.5°F | Resp 16 | Ht 66.0 in | Wt 204.2 lb

## 2013-08-07 DIAGNOSIS — J329 Chronic sinusitis, unspecified: Secondary | ICD-10-CM

## 2013-08-07 DIAGNOSIS — B9689 Other specified bacterial agents as the cause of diseases classified elsewhere: Secondary | ICD-10-CM | POA: Insufficient documentation

## 2013-08-07 DIAGNOSIS — J45909 Unspecified asthma, uncomplicated: Secondary | ICD-10-CM

## 2013-08-07 DIAGNOSIS — J019 Acute sinusitis, unspecified: Secondary | ICD-10-CM

## 2013-08-07 MED ORDER — ALBUTEROL SULFATE HFA 108 (90 BASE) MCG/ACT IN AERS: 1.0000 | INHALATION_SPRAY | Freq: Four times a day (QID) | RESPIRATORY_TRACT | Status: DC | PRN

## 2013-08-07 MED ORDER — BENZONATATE 100 MG PO CAPS: 100.0000 mg | ORAL_CAPSULE | Freq: Three times a day (TID) | ORAL | Status: DC | PRN

## 2013-08-07 MED ORDER — PREDNISONE 20 MG PO TABS: 40.0000 mg | ORAL_TABLET | Freq: Every day | ORAL | Status: DC

## 2013-08-07 MED ORDER — AMOXICILLIN-POT CLAVULANATE 875-125 MG PO TABS: 1.0000 | ORAL_TABLET | Freq: Two times a day (BID) | ORAL | Status: DC

## 2013-08-07 NOTE — Assessment & Plan Note (Signed)
Rx Albuterol.  Prednisone burst due to exacerbation with URI.  Continue claritin and Flonase.  Humidifier in bedroom.

## 2013-08-07 NOTE — Patient Instructions (Signed)
Take antibiotic and prednisone as directed.  HOLD methotrexate while on antibiotic.  Call and ask specialist about Remicade.  Increase fluid intake. Use albuterol inhaler up to every 6 hours if needed for wheezing.  Continue Flonase and plain Mucinex.  Call or return to clinic if symptoms are not improving.

## 2013-08-07 NOTE — Progress Notes (Signed)
Pre visit review using our clinic review tool, if applicable. No additional management support is needed unless otherwise documented below in the visit note/SLS  

## 2013-08-07 NOTE — Assessment & Plan Note (Signed)
Rx Augmentin.  Rx prednisone for wheeze.  HOLD methotrexate while on medication. Call Rheumatologist to discuss when next dosing of Remicade should be.  Increase fluid intake.  Rest.  Saline nasal spray.  Continue allergy medications.

## 2013-08-07 NOTE — Progress Notes (Signed)
Patient presents to clinic today c/o sinus pressure, sinus pain, PND, productive cough and wheezing for > 1 week. Patient endorses ear pain and fatigue.  Patient denies fever, chills, pleuritic chest pain, recent travel or sick contact.  Past Medical History  Diagnosis Date  . GERD (gastroesophageal reflux disease)   . Ulcer   . Blood in stool   . Ulcerative colitis   . Inguinal hernia     bilateral  . Fibromyalgia   . Arthritis   . Insomnia     d/t pain  . Chicken pox     Current Outpatient Prescriptions on File Prior to Visit  Medication Sig Dispense Refill  . ALPRAZolam (XANAX) 1 MG tablet Take 1 mg by mouth 2 (two) times daily as needed for anxiety or sleep.      . cyclobenzaprine (FLEXERIL) 10 MG tablet Take 10 mg by mouth 2 (two) times daily as needed for muscle spasms.      . DULoxetine (CYMBALTA) 60 MG capsule Take 60 mg by mouth daily.      . fluticasone (FLONASE) 50 MCG/ACT nasal spray Place 2 sprays into both nostrils daily.  16 g  6  . folic acid (FOLVITE) 1 MG tablet Take 1 mg by mouth daily.      Marland Kitchen. HYDROcodone-acetaminophen (NORCO) 10-325 MG per tablet Take 1 tablet by mouth as needed.      . InFLIXimab (REMICADE IV) Inject 6 vials into the vein every 30 (thirty) days. Every 4 weeks.      . methotrexate (50 MG/ML) 1 G injection Inject 0.4 mg into the vein once a week.      . TRUVADA 200-300 MG per tablet TAKE 1 TABLET BY MOUTH    DAILY  90 tablet  0  . vitamin C (ASCORBIC ACID) 500 MG tablet Take 500 mg by mouth daily.       No current facility-administered medications on file prior to visit.    No Known Allergies  Family History  Problem Relation Age of Onset  . Breast cancer Mother   . Hyperlipidemia Maternal Grandmother   . Heart disease Maternal Grandmother   . Lung cancer Maternal Grandfather   . Heart disease Paternal Grandfather   . Heart disease Paternal Grandmother   . Diverticulitis Maternal Grandmother   . Diabetes Maternal Aunt   . Heart disease  Paternal Uncle   . Healthy Brother     x3  . Healthy Sister     x2    History   Social History  . Marital Status: Single    Spouse Name: N/A    Number of Children: 0  . Years of Education: N/A   Occupational History  . business analyst    Social History Main Topics  . Smoking status: Former Smoker -- 1.00 packs/day for 8 years    Types: Cigarettes    Quit date: 01/27/2011  . Smokeless tobacco: Former NeurosurgeonUser    Types: Snuff    Quit date: 01/27/2011     Comment: forme user of1/2 can daily- 1/2 ppd- Quit 01/27/2011  . Alcohol Use: 0.6 oz/week    1 Glasses of wine per week     Comment: occasionally   . Drug Use: No  . Sexual Activity: Yes    Birth Control/ Protection: Condom     Comment: male   Other Topics Concern  . None   Social History Narrative   Regular exercise: gym daily   Caffeine use: 5 cups of coffee everyday  Review of Systems - See HPI.  All other ROS are negative.  BP 118/88  Pulse 105  Temp(Src) 98.5 F (36.9 C) (Oral)  Resp 16  Ht 5\' 6"  (1.676 m)  Wt 204 lb 4 oz (92.647 kg)  BMI 32.98 kg/m2  SpO2 97%  Physical Exam  Vitals reviewed. Constitutional: He is oriented to person, place, and time and well-developed, well-nourished, and in no distress.  HENT:  Head: Normocephalic and atraumatic.  Right Ear: External ear normal.  Left Ear: External ear normal.  Nose: Nose normal.  Mouth/Throat: Oropharynx is clear and moist. No oropharyngeal exudate.  R TM erythematous and bulging.  L TM within normal limits.  + TTP of sinuses.  Eyes: Conjunctivae are normal. Pupils are equal, round, and reactive to light.  Neck: Neck supple.  Cardiovascular: Normal rate, regular rhythm, normal heart sounds and intact distal pulses.   Pulmonary/Chest: Effort normal. No respiratory distress. He has wheezes. He has no rales. He exhibits no tenderness.  Neurological: He is alert and oriented to person, place, and time.  Skin: Skin is warm and dry. No rash noted.   Psychiatric: Affect normal.    Recent Results (from the past 2160 hour(s))  COMPREHENSIVE METABOLIC PANEL     Status: Abnormal   Collection Time    06/19/13  4:27 PM      Result Value Ref Range   Sodium 139  135 - 145 mEq/L   Potassium 4.3  3.5 - 5.3 mEq/L   Chloride 103  96 - 112 mEq/L   CO2 29  19 - 32 mEq/L   Glucose, Bld 69 (*) 70 - 99 mg/dL   BUN 11  6 - 23 mg/dL   Creat 4.090.71  8.110.50 - 9.141.35 mg/dL   Total Bilirubin 0.3  0.2 - 1.2 mg/dL   Comment: ** Please note change in reference range(s). **   Alkaline Phosphatase 65  39 - 117 U/L   AST 21  0 - 37 U/L   ALT 20  0 - 53 U/L   Total Protein 6.6  6.0 - 8.3 g/dL   Albumin 4.0  3.5 - 5.2 g/dL   Calcium 8.9  8.4 - 78.210.5 mg/dL  HIV ANTIBODY (ROUTINE TESTING)     Status: None   Collection Time    06/19/13  4:27 PM      Result Value Ref Range   HIV NON REACTIVE  NON REACTIVE   Assessment/Plan: Asthma due to seasonal allergies Rx Albuterol.  Prednisone burst due to exacerbation with URI.  Continue claritin and Flonase.  Humidifier in bedroom.  Sinusitis Rx Augmentin.  Rx prednisone for wheeze.  HOLD methotrexate while on medication. Call Rheumatologist to discuss when next dosing of Remicade should be.  Increase fluid intake.  Rest.  Saline nasal spray.  Continue allergy medications.

## 2013-09-05 ENCOUNTER — Other Ambulatory Visit: Payer: Self-pay | Admitting: Physician Assistant

## 2013-09-05 NOTE — Telephone Encounter (Signed)
Rx request to pharmacy/[facility]  

## 2013-09-18 ENCOUNTER — Ambulatory Visit: Payer: BC Managed Care – PPO | Admitting: Physician Assistant

## 2013-09-18 DIAGNOSIS — Z0289 Encounter for other administrative examinations: Secondary | ICD-10-CM

## 2013-11-28 ENCOUNTER — Encounter: Payer: Self-pay | Admitting: *Deleted

## 2013-11-28 ENCOUNTER — Telehealth: Payer: Self-pay | Admitting: Internal Medicine

## 2013-11-28 DIAGNOSIS — R197 Diarrhea, unspecified: Secondary | ICD-10-CM

## 2013-11-28 MED ORDER — MESALAMINE 1.2 G PO TBEC: 4.8000 g | DELAYED_RELEASE_TABLET | Freq: Every day | ORAL | Status: DC

## 2013-11-28 MED ORDER — HYDROCORTISONE ACETATE 25 MG RE SUPP: 25.0000 mg | Freq: Two times a day (BID) | RECTAL | Status: DC

## 2013-11-28 NOTE — Telephone Encounter (Signed)
Patient notified New labs entered and rx sent

## 2013-11-28 NOTE — Telephone Encounter (Signed)
Would check GI pathogen panel to exclude infection Can add Lialda 4.8 g daily and hydrocortisone suppository 25 mg BID until next week

## 2013-11-28 NOTE — Telephone Encounter (Signed)
Patient with a history of UC.  He reports that he is having mucus, some bright red rectal bleeding he believes is from hemorrhoids.  He feels he needs to have a BM but is only passing small amounts of "brown liquid" several times a day.  He is on Remicade for his arthritis with Dr. Mallie MusselBeakman, he is scheduled for the next infusion in 2 weeks.  He is not on any oral meds for his UC.  I have given him an appt for 8:30 on 8/11 with Dr. Rhea BeltonPyrtle.  Please advise what he needs to do until OV Tuesday, no APP appt until next week.  He is having difficulty working due to the urgency

## 2013-11-30 ENCOUNTER — Emergency Department (HOSPITAL_COMMUNITY)
Admission: EM | Admit: 2013-11-30 | Discharge: 2013-11-30 | Disposition: A | Discharge status: Home / Self Care | Payer: BC Managed Care – PPO | Attending: Emergency Medicine | Admitting: Emergency Medicine

## 2013-11-30 ENCOUNTER — Emergency Department (HOSPITAL_COMMUNITY): Payer: BC Managed Care – PPO

## 2013-11-30 ENCOUNTER — Encounter (HOSPITAL_COMMUNITY): Payer: Self-pay | Admitting: Emergency Medicine

## 2013-11-30 DIAGNOSIS — M129 Arthropathy, unspecified: Secondary | ICD-10-CM | POA: Insufficient documentation

## 2013-11-30 DIAGNOSIS — Z8619 Personal history of other infectious and parasitic diseases: Secondary | ICD-10-CM | POA: Insufficient documentation

## 2013-11-30 DIAGNOSIS — Z87891 Personal history of nicotine dependence: Secondary | ICD-10-CM | POA: Insufficient documentation

## 2013-11-30 DIAGNOSIS — Z79899 Other long term (current) drug therapy: Secondary | ICD-10-CM | POA: Insufficient documentation

## 2013-11-30 DIAGNOSIS — K5190 Ulcerative colitis, unspecified, without complications: Secondary | ICD-10-CM | POA: Insufficient documentation

## 2013-11-30 DIAGNOSIS — K625 Hemorrhage of anus and rectum: Secondary | ICD-10-CM | POA: Insufficient documentation

## 2013-11-30 DIAGNOSIS — IMO0002 Reserved for concepts with insufficient information to code with codable children: Secondary | ICD-10-CM | POA: Insufficient documentation

## 2013-11-30 DIAGNOSIS — R109 Unspecified abdominal pain: Secondary | ICD-10-CM | POA: Insufficient documentation

## 2013-11-30 DIAGNOSIS — K5900 Constipation, unspecified: Secondary | ICD-10-CM | POA: Insufficient documentation

## 2013-11-30 DIAGNOSIS — K51911 Ulcerative colitis, unspecified with rectal bleeding: Secondary | ICD-10-CM

## 2013-11-30 LAB — COMPREHENSIVE METABOLIC PANEL
ALT: 32 U/L (ref 0–53)
AST: 32 U/L (ref 0–37)
Albumin: 3.7 g/dL (ref 3.5–5.2)
Alkaline Phosphatase: 69 U/L (ref 39–117)
Anion gap: 13 (ref 5–15)
BILIRUBIN TOTAL: 0.6 mg/dL (ref 0.3–1.2)
BUN: 12 mg/dL (ref 6–23)
CHLORIDE: 101 meq/L (ref 96–112)
CO2: 27 meq/L (ref 19–32)
Calcium: 9.5 mg/dL (ref 8.4–10.5)
Creatinine, Ser: 0.92 mg/dL (ref 0.50–1.35)
GLUCOSE: 91 mg/dL (ref 70–99)
POTASSIUM: 4.4 meq/L (ref 3.7–5.3)
SODIUM: 141 meq/L (ref 137–147)
Total Protein: 8.6 g/dL — ABNORMAL HIGH (ref 6.0–8.3)

## 2013-11-30 LAB — POC OCCULT BLOOD, ED: Fecal Occult Bld: POSITIVE — AB

## 2013-11-30 LAB — CBC WITH DIFFERENTIAL/PLATELET
Basophils Absolute: 0 10*3/uL (ref 0.0–0.1)
Basophils Relative: 1 % (ref 0–1)
Eosinophils Absolute: 0.1 10*3/uL (ref 0.0–0.7)
Eosinophils Relative: 1 % (ref 0–5)
HCT: 45.5 % (ref 39.0–52.0)
Hemoglobin: 15.9 g/dL (ref 13.0–17.0)
LYMPHS PCT: 39 % (ref 12–46)
Lymphs Abs: 2 10*3/uL (ref 0.7–4.0)
MCH: 32.9 pg (ref 26.0–34.0)
MCHC: 34.9 g/dL (ref 30.0–36.0)
MCV: 94 fL (ref 78.0–100.0)
Monocytes Absolute: 0.9 10*3/uL (ref 0.1–1.0)
Monocytes Relative: 17 % — ABNORMAL HIGH (ref 3–12)
NEUTROS PCT: 42 % — AB (ref 43–77)
Neutro Abs: 2.2 10*3/uL (ref 1.7–7.7)
PLATELETS: 184 10*3/uL (ref 150–400)
RBC: 4.84 MIL/uL (ref 4.22–5.81)
RDW: 12.5 % (ref 11.5–15.5)
WBC: 5.1 10*3/uL (ref 4.0–10.5)

## 2013-11-30 LAB — LIPASE, BLOOD: Lipase: 24 U/L (ref 11–59)

## 2013-11-30 MED ORDER — PREDNISONE 10 MG PO TABS: 40.0000 mg | ORAL_TABLET | Freq: Every day | ORAL | Status: DC

## 2013-11-30 MED ORDER — PREDNISONE 20 MG PO TABS
40.0000 mg | ORAL_TABLET | Freq: Once | ORAL | Status: AC
  Administered 2013-11-30: 40 mg via ORAL
  Filled 2013-11-30: qty 2

## 2013-11-30 NOTE — ED Notes (Signed)
Pt c/o that last Monday was last normal BM. Pt states that he has had some liquid stool over past couple of days with nausea.  Pt has bright red blood everytime he tries to have BM and also finds small amounts of blood in his underwear.  Pt also states that this morning he got dizzy and lightheaded.  Pt has PMH of ulcerative colitis.  Pt states that he is homosexual and last anal intercourse last Friday. Pt denies using any cleaners before anal sex.

## 2013-11-30 NOTE — Discharge Instructions (Signed)
Ulcerative Colitis  Ulcerative colitis is a long lasting swelling and soreness (inflammation) of the colon (large intestine). In patients with ulcerative colitis, sores (ulcers) and inflammation of the inner lining of the colon lead to illness. Ulcerative colitis can also cause problems outside the digestive tract.   Ulcerative colitis is closely related to another condition of inflammation of the intestines called Crohn's disease. Together, they are frequently referred to as inflammatory bowel disease (IBD). Ulcerative colitis and Crohn's diseases are conditions that can last years to decades. Men and women are affected equally. They most commonly begin during adolescence and early adulthood.  SYMPTOMS   Common symptoms of ulcerative colitis include rectal bleeding and diarrhea. There is a wide range of symptoms among patients with this disease depending on how severe the disease is. Some of these symptoms are:  · Abdominal pain or cramping.  · Diarrhea.  · Fever.  · Tiredness (fatigue).  · Weight loss.  · Night sweats.  · Rectal pain.  · Feeling the immediate need to have a bowel movement (rectal urgency).  CAUSES   Ulcerative colitis is caused by increased activity of the immune system in the intestines. The immune system is the system that protects the body against disease such as harmful bacteria, viruses, fungi, and other foreign invaders. When the immune system overacts, it causes inflammation. The cause of the increased immune system activity is not known. This over activity causes long-lasting inflammation and ulceration. This condition may be passed down from your parents (inherited). Brothers, sisters, children, and parents of patients with IBD are more likely to develop these diseases. It is not contagious. This means you cannot catch it from someone else.  DIAGNOSIS   Your caregiver may suspect ulcerative colitis based on your symptoms and exam. Blood tests may confirm that there is a problem. You may  be asked to submit a stool specimen for examination. X-rays and CT scans may be necessary. Ultimately, the diagnosis is usually made after a flexible tube is inserted via your anus and your colon is examined under sedation (colonoscopy). With this test, the specialist can take a tiny tissue sample from inside the bowel (biopsy). Examination of this biopsy tissue under a microscopy can reveal ulcerative colitis as the cause of your symptoms.  TREATMENT   · There is no cure for ulcerative colitis.  · Complications such as massive bleeding from the colon (hemorrhage), development of a hole in the colon (perforation), or the development of precancerous or cancerous changes of the colon may require surgery.  · Medications are often used to decrease inflammation and control the immune system. These include medicines related to aspirin, steroid medications, and newer and stronger medications to slow down the immune system. Some medications may be used as suppositories or enemas. A number of other medications are used or have been studied. Your caregiver will make specific recommendations.  HOME CARE INSTRUCTIONS   · There is no cure for ulcerative colitis disease. The best treatment is frequent checkups with your caregiver. Periodic reevaluation is important.  · Symptoms such as diarrhea can be controlled with medications. Avoid foods that have a laxative effect such fresh fruit and vegetables and dairy products. During flare ups, you can rest your bowel by staying away from solid foods. Drink clear liquids frequently during the day. Electrolyte or rehydrating fluids are best. Your caregiver can help you with suggestions. Drink often to prevent dehydration. When diarrhea has cleared, eat smaller meals and more often. Avoid food additives   and stimulants such as caffeine (coffee, tea, many sodas, or chocolate). Avoid dairy products. Enzyme supplements may help if you develop intolerance to a sugar in dairy products  (lactose). Ask your caregiver or dietitian about specific dietary instructions.  · If you had surgery, be sure you understand your care instructions thoroughly, including proper care of any surgical wounds.  · Take any medications exactly as prescribed.  · Try to maintain a positive attitude. Learn relaxation techniques such as self hypnosis, mental imaging, and muscle relaxation. If possible, avoid stresses that aggravate your condition. Exercise regularly. Follow your diet. Always get plenty of rest.  SEEK MEDICAL CARE IF:   · Your symptoms fail to improve after a week or two of new treatment.  · You experience continued weight loss.  · You have ongoing crampy digestion or loose bowels.  · You develop a new skin rash, skin sores, or eye problems.  SEEK IMMEDIATE MEDICAL CARE IF:   · You have worsening of your symptoms or develop new symptoms.  · You have an oral temperature above 102° F (38.9° C), not controlled by medicine.  · You develop bloody diarrhea.  · You have severe abdominal pain.  Document Released: 01/19/2005 Document Revised: 07/04/2011 Document Reviewed: 12/19/2006  ExitCare® Patient Information ©2015 [employer]. This information is not intended to replace advice given to you by your health care provider. Make sure you discuss any questions you have with your health care provider.

## 2013-11-30 NOTE — ED Provider Notes (Signed)
Medical screening examination/treatment/procedure(s) were performed by non-physician practitioner and as supervising physician I was immediately available for consultation/collaboration.   EKG Interpretation None        Mel Langan, MD 11/30/13 1500 

## 2013-11-30 NOTE — ED Notes (Signed)
Patient is aware that a urine sample is needed, and will provide one when able to.

## 2013-11-30 NOTE — ED Notes (Signed)
Patient transported to X-ray 

## 2013-11-30 NOTE — ED Provider Notes (Signed)
CSN: [id number]     Arrival date & time 11/30/13  1155 History   First MD Initiated Contact with Patient 11/30/13 1235     Chief Complaint  Patient presents with  . Abdominal Pain  . Constipation  . Rectal Bleeding     (Consider location/radiation/quality/duration/timing/severity/associated sxs/prior Treatment) HPI  Patient presents to the emergency department with complaints of ulcerative colitis flare, constipation and rectal bleeding. He sees [facility] for his ulcerative colitis. He admits that he had anal sex one week ago and is symptoms started 3 days afterwards. He did not use any cleaners before. The sex was not painful and denies anything occuring out of the ordinary. He says that he has had watery diarrhea and constipation with it. He is most concerned about the BRB per rectum and wonders if it is his hemorrhoids. He called the GI office and they asked him to increased his  Lialda and take Hydrocortisone suppositories at home. He is to be seen this Tuesday in the office. He however has come to the ED because the pain, bleeding and constipation are concerning him.   Past Medical History  Diagnosis Date  . GERD (gastroesophageal reflux disease)   . Ulcer   . Blood in stool   . Ulcerative colitis   . Inguinal hernia     bilateral  . Fibromyalgia   . Arthritis   . Insomnia     d/t pain  . Chicken pox    Past Surgical History  Procedure Laterality Date  . Wisdom tooth extraction    . Colonoscopy     Family History  Problem Relation Age of Onset  . Breast cancer Mother   . Hyperlipidemia Maternal Grandmother   . Heart disease Maternal Grandmother   . Lung cancer Maternal Grandfather   . Heart disease Paternal Grandfather   . Heart disease Paternal Grandmother   . Diverticulitis Maternal Grandmother   . Diabetes Maternal Aunt   . Heart disease Paternal Uncle   . Healthy Brother     x3  . Healthy Sister     x2   History  Substance Use Topics  .  Smoking status: Former Smoker -- 1.00 packs/day for 8 years    Types: Cigarettes    Quit date: 01/27/2011  . Smokeless tobacco: Former Neurosurgeon    Types: Snuff    Quit date: 01/27/2011     Comment: forme user of1/2 can daily- 1/2 ppd- Quit 01/27/2011  . Alcohol Use: 0.6 oz/week    1 Glasses of wine per week     Comment: occasionally     Review of Systems   Review of Systems  Gen: no weight loss, fevers, chills, night sweats  Eyes: no discharge or drainage, no occular pain or visual changes  Nose: no epistaxis or rhinorrhea  Mouth: no dental pain, no sore throat  Neck: no neck pain  Lungs:No wheezing or hemoptysis No coughing CV:  No palpitations, dependent edema or orthopnea. No chest pain Abd: + diarrhea, rectal bleeding, constipation and abdominal pain.  No nausea or vomiting, GU: no dysuria or gross hematuria  MSK:  No muscle weakness, No  pain Neuro: no headache, no focal neurologic deficits  Skin: no rash , no wounds Psyche: no complaints    Allergies  Review of patient's allergies indicates no known allergies.  Home Medications   Prior to Admission medications   Medication Sig Start Date End Date Taking? Authorizing Provider  ALPRAZolam Prudy Feeler) 1 MG tablet Take 1  mg by mouth 2 (two) times daily as needed for anxiety or sleep.   Yes Historical Provider, MD  cyclobenzaprine (FLEXERIL) 10 MG tablet Take 10 mg by mouth 2 (two) times daily as needed for muscle spasms.   Yes Historical Provider, MD  DULoxetine (CYMBALTA) 60 MG capsule Take 60 mg by mouth daily.   Yes Historical Provider, MD  emtricitabine-tenofovir (TRUVADA) 200-300 MG per tablet Take 1 tablet by mouth daily.   Yes Historical Provider, MD  HYDROcodone-acetaminophen (NORCO/VICODIN) 5-325 MG per tablet Take 1 tablet by mouth every 6 (six) hours as needed for moderate pain.   Yes Historical Provider, MD  hydrocortisone (ANUSOL-HC) 25 MG suppository Place 25 mg rectally 2 (two) times daily.   Yes Historical  Provider, MD  InFLIXimab (REMICADE IV) Inject 6 vials into the vein every 30 (thirty) days. Every 4 weeks.   Yes Historical Provider, MD  mesalamine (LIALDA) 1.2 G EC tablet Take 4.8 g by mouth daily with breakfast.   Yes Historical Provider, MD  polycarbophil (FIBERCON) 625 MG tablet Take 625 mg by mouth daily.   Yes Historical Provider, MD  polyethylene glycol (MIRALAX / GLYCOLAX) packet Take 17 g by mouth daily.   Yes Historical Provider, MD  pregabalin (LYRICA) 75 MG capsule Take 75 mg by mouth 2 (two) times daily.   Yes Historical Provider, MD  predniSONE (DELTASONE) 10 MG tablet Take 4 tablets (40 mg total) by mouth daily. 12/01/13   Maleya Leever Irine SealG Christeen Lai, PA-C   BP 134/94  Pulse 100  Temp(Src) 98.4 F (36.9 C) (Oral)  Resp 18  SpO2 100% Physical Exam  Nursing note and vitals reviewed. Constitutional: He appears well-developed and well-nourished. No distress.  HENT:  Head: Normocephalic and atraumatic.  Eyes: Pupils are equal, round, and reactive to light.  Neck: Normal range of motion. Neck supple.  Cardiovascular: Normal rate and regular rhythm.   Pulmonary/Chest: Effort normal.  Abdominal: Soft.  Genitourinary: Rectal exam shows tenderness. Guaiac positive stool.  Neurological: He is alert.  Skin: Skin is warm and dry.    ED Course  Procedures (including critical care time) Labs Review Labs Reviewed  CBC WITH DIFFERENTIAL - Abnormal; Notable for the following:    Neutrophils Relative % 42 (*)    Monocytes Relative 17 (*)    All other components within normal limits  COMPREHENSIVE METABOLIC PANEL - Abnormal; Notable for the following:    Total Protein 8.6 (*)    All other components within normal limits  POC OCCULT BLOOD, ED - Abnormal; Notable for the following:    Fecal Occult Bld POSITIVE (*)    All other components within normal limits  LIPASE, BLOOD  URINALYSIS, ROUTINE W REFLEX MICROSCOPIC  POC OCCULT BLOOD, ED    Imaging Review Dg Abd 2 Views  11/30/2013    CLINICAL DATA:  28 year old male with abdominal pain, constipation and diarrhea. Rectal bleeding  EXAM: ABDOMEN - 2 VIEW  COMPARISON:  03/29/2011 CT  FINDINGS: The bowel gas pattern is normal. There is no evidence of free air. No radio-opaque calculi or other significant radiographic abnormality is seen.  IMPRESSION: Negative.   Electronically Signed   By: Laveda AbbeJeff  Hu M.D.   On: 11/30/2013 14:30     EKG Interpretation None      MDM   Final diagnoses:  Ulcerative colitis, with rectal bleeding    pts abdomen is soft and he does not guard on exam. I spoke with Dr. Rhea BeltonPyrtle GI who reviewed his abdominal xray and said  it showed significant amount of stool. He is concerned as to whether this is infection or crohn's flair, wants a stool sample. Due to constipation unable to get one in ED but given stool cup for home.  He recommends increasing his Miralax use to 2-3 times per day until he goes to the bathroom. He also asks that I start his prednisone taper, 40mg  q day for 5 days.  Pt declines wanting pain medication in ED or for home, says that makes constipation worse.  He will see Dr. Rhea Belton in the office on Tuesday and will need a colonoscopy.   28 y.o.July Hoglund's evaluation in the Emergency Department is complete. It has been determined that no acute conditions requiring further emergency intervention are present at this time. The patient/guardian have been advised of the diagnosis and plan. We have discussed signs and symptoms that warrant return to the ED, such as changes or worsening in symptoms.  Vital signs are stable at discharge. Filed Vitals:   11/30/13 1228  BP: 134/94  Pulse: 100  Temp: 98.4 F (36.9 C)  Resp: 18    Patient/guardian has voiced understanding and agreed to follow-up with the PCP or specialist.     Dorthula Matas, PA-C 11/30/13 1440

## 2013-12-02 ENCOUNTER — Other Ambulatory Visit: Payer: BC Managed Care – PPO

## 2013-12-02 DIAGNOSIS — R197 Diarrhea, unspecified: Secondary | ICD-10-CM

## 2013-12-03 ENCOUNTER — Encounter: Payer: Self-pay | Admitting: Internal Medicine

## 2013-12-03 ENCOUNTER — Other Ambulatory Visit: Payer: BC Managed Care – PPO

## 2013-12-03 ENCOUNTER — Ambulatory Visit (INDEPENDENT_AMBULATORY_CARE_PROVIDER_SITE_OTHER): Payer: BC Managed Care – PPO | Admitting: Internal Medicine

## 2013-12-03 VITALS — BP 116/82 | HR 88 | Ht 66.0 in | Wt 203.0 lb

## 2013-12-03 DIAGNOSIS — Z9229 Personal history of other drug therapy: Secondary | ICD-10-CM

## 2013-12-03 DIAGNOSIS — K5190 Ulcerative colitis, unspecified, without complications: Secondary | ICD-10-CM

## 2013-12-03 DIAGNOSIS — M129 Arthropathy, unspecified: Secondary | ICD-10-CM

## 2013-12-03 DIAGNOSIS — K5289 Other specified noninfective gastroenteritis and colitis: Secondary | ICD-10-CM

## 2013-12-03 DIAGNOSIS — K529 Noninfective gastroenteritis and colitis, unspecified: Secondary | ICD-10-CM

## 2013-12-03 DIAGNOSIS — G4700 Insomnia, unspecified: Secondary | ICD-10-CM

## 2013-12-03 DIAGNOSIS — Z09 Encounter for follow-up examination after completed treatment for conditions other than malignant neoplasm: Secondary | ICD-10-CM

## 2013-12-03 DIAGNOSIS — K644 Residual hemorrhoidal skin tags: Secondary | ICD-10-CM

## 2013-12-03 LAB — GASTROINTESTINAL PATHOGEN PANEL PCR
C. difficile Tox A/B, PCR: NEGATIVE
Campylobacter, PCR: NEGATIVE
Cryptosporidium, PCR: NEGATIVE
E coli (ETEC) LT/ST PCR: NEGATIVE
E coli (STEC) stx1/stx2, PCR: NEGATIVE
E coli 0157, PCR: NEGATIVE
Giardia lamblia, PCR: NEGATIVE
NOROVIRUS, PCR: NEGATIVE
Rotavirus A, PCR: NEGATIVE
Salmonella, PCR: NEGATIVE
Shigella, PCR: NEGATIVE

## 2013-12-03 MED ORDER — MOVIPREP 100 G PO SOLR: 1.0000 | Freq: Once | ORAL | Status: DC

## 2013-12-03 MED ORDER — HYDROCORTISONE ACE-PRAMOXINE 2.5-1 % RE CREA: 1.0000 "application " | TOPICAL_CREAM | Freq: Three times a day (TID) | RECTAL | Status: DC

## 2013-12-03 MED ORDER — PREDNISONE 10 MG PO TABS: 40.0000 mg | ORAL_TABLET | Freq: Every day | ORAL | Status: DC

## 2013-12-03 NOTE — Progress Notes (Signed)
Patient ID: Keith Walker, male   DOB: 09/16/1985, 28 y.o.   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 He was last seen in September 2013 but has been followed closely by Dr. Alben DeedsJames Beekman in rheumatology clinic. He has been treating him for his arthropathy as well as a new diagnosis of fibromyalgia. He has been on Remicade for nearly 2 years and is currently taking 6 mg/kg every 4 weeks. He was taking methotrexate every week for approximately 8-9 months but stopped approximately a month ago. He was started on Lyrica for fibromyalgia and this has significantly helped some of his muscle pain as well as hand and foot pain. He had had no trouble with his colitis symptoms over the last 2 years except starting one week ago. He noted over, particularly left-sided abdominal pain, cramping and change in bowel habit with bloody stools. Initially he became constipated but now he is having small volume loose stools 2-3 times daily. This has been associated with tenesmus. Initially he was having his left lower quadrant pain radiating to his back. He's also noticed some pain near the end of urination but no blood in his urine, air in his urine or frequency. While he was waiting for followup I started him on Lialda 4.8 g daily and hydrocortisone suppository. He was seen in the emergency room/urgent care 3 days ago. The PA called me and I began him on prednisone 40 mg daily which he has been using for the last 3 days. With this he has noted improvement in his abdominal pain but he continues to have loose bloody bowel movements. He denies oral ulcers. Joint pains returned yesterday a little bit but he thinks this might have been related to the weather. Some itchy dry eyes status post LASIK operation a month ago. Appetite has been better on prednisone but was decreased for  the last week. Weight overall stable. No fevers or chills.  He does take Truvada for prophylaxis, he does not have a diagnosis of HIV  Past Medical History  Diagnosis Date  . GERD (gastroesophageal reflux disease)   . Ulcer   . Blood in stool   . Ulcerative colitis   . Inguinal hernia     bilateral  . Fibromyalgia   . Arthritis   . Insomnia     d/t pain  . Chicken pox     Past Surgical History  Procedure Laterality Date  . Wisdom tooth extraction    . Colonoscopy    . Lasik Bilateral     Outpatient Prescriptions Prior to Visit  Medication Sig Dispense Refill  . ALPRAZolam (XANAX) 1 MG tablet Take 1 mg by mouth 2 (two) times daily as needed for anxiety or sleep.      . cyclobenzaprine (FLEXERIL) 10 MG tablet Take 10 mg by mouth 2 (two) times daily as needed for muscle spasms.      . DULoxetine (CYMBALTA) 60 MG capsule Take 60 mg by mouth daily.      Marland Kitchen. emtricitabine-tenofovir (TRUVADA) 200-300 MG per tablet Take 1 tablet by mouth daily.      Marland Kitchen. HYDROcodone-acetaminophen (NORCO/VICODIN) 5-325 MG per tablet Take 1 tablet by mouth every 6 (six) hours as needed for moderate pain.      . InFLIXimab (REMICADE IV) Inject 6 vials into the vein every 30 (thirty) days. Every 4 weeks.      .Marland Kitchen  mesalamine (LIALDA) 1.2 G EC tablet Take 4.8 g by mouth daily with breakfast.      . polycarbophil (FIBERCON) 625 MG tablet Take 625 mg by mouth daily.      . polyethylene glycol (MIRALAX / GLYCOLAX) packet Take 17 g by mouth daily.      . pregabalin (LYRICA) 75 MG capsule Take 75 mg by mouth 2 (two) times daily.      . hydrocortisone (ANUSOL-HC) 25 MG suppository Place 25 mg rectally 2 (two) times daily.      . predniSONE (DELTASONE) 10 MG tablet Take 4 tablets (40 mg total) by mouth daily.  16 tablet  0   No facility-administered medications prior to visit.    No Known Allergies  Family History  Problem Relation Age of Onset  . Breast cancer Mother   . Hyperlipidemia Maternal Grandmother   .  Heart disease Maternal Grandmother   . Lung cancer Maternal Grandfather   . Heart disease Paternal Grandfather   . Heart disease Paternal Grandmother   . Diverticulitis Maternal Grandmother   . Diabetes Maternal Aunt   . Heart disease Paternal Uncle   . Healthy Brother     x3  . Healthy Sister     x2    History  Substance Use Topics  . Smoking status: Former Smoker -- 1.00 packs/day for 8 years    Types: Cigarettes    Quit date: 01/27/2011  . Smokeless tobacco: Former Neurosurgeon    Types: Snuff    Quit date: 01/27/2011     Comment: forme user of1/2 can daily- 1/2 ppd- Quit 01/27/2011  . Alcohol Use: 0.6 oz/week    1 Glasses of wine per week     Comment: occasionally     ROS: As per history of present illness, otherwise negative  BP 116/82  Pulse 88  Ht 5\' 6"  (1.676 m)  Wt 203 lb (92.08 kg)  BMI 32.78 kg/m2 Constitutional: Well-developed and well-nourished. No distress. HEENT: Normocephalic and atraumatic. Oropharynx is clear and moist. No oropharyngeal exudate. Conjunctivae are normal.  No scleral icterus. Neck: Neck supple. Trachea midline. Cardiovascular: Normal rate, regular rhythm and intact distal pulses. No M/R/G Pulmonary/chest: Effort normal and breath sounds normal. No wheezing, rales or rhonchi. Abdominal: Soft, lower abdominal tenderness moderate without rebound or guarding, nondistended. Bowel sounds active throughout. There are no masses palpable. No hepatosplenomegaly. Extremities: no clubbing, cyanosis, or edema Lymphadenopathy: No cervical adenopathy noted. Neurological: Alert and oriented to person place and time. Skin: Skin is warm and dry. No rashes noted. Psychiatric: Normal mood and affect. Behavior is normal.  RELEVANT LABS AND IMAGING: CBC    Component Value Date/Time   WBC 5.1 11/30/2013 1234   RBC 4.84 11/30/2013 1234   HGB 15.9 11/30/2013 1234   HCT 45.5 11/30/2013 1234   PLT 184 11/30/2013 1234   MCV 94.0 11/30/2013 1234   MCH 32.9 11/30/2013 1234    MCHC 34.9 11/30/2013 1234   RDW 12.5 11/30/2013 1234   LYMPHSABS 2.0 11/30/2013 1234   MONOABS 0.9 11/30/2013 1234   EOSABS 0.1 11/30/2013 1234   BASOSABS 0.0 11/30/2013 1234    CMP     Component Value Date/Time   NA 141 11/30/2013 1234   K 4.4 11/30/2013 1234   CL 101 11/30/2013 1234   CO2 27 11/30/2013 1234   GLUCOSE 91 11/30/2013 1234   BUN 12 11/30/2013 1234   CREATININE 0.92 11/30/2013 1234   CREATININE 0.71 06/19/2013 1627   CALCIUM 9.5 11/30/2013 1234  PROT 8.6* 11/30/2013 1234   ALBUMIN 3.7 11/30/2013 1234   AST 32 11/30/2013 1234   ALT 32 11/30/2013 1234   ALKPHOS 69 11/30/2013 1234   BILITOT 0.6 11/30/2013 1234   GFRNONAA >90 11/30/2013 1234   GFRAA >90 11/30/2013 1234   Last colonoscopy 05/23/2011 -- normal TI, mild erythema in the sigmoid otherwise normal exam. 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  1. UC with inflammatory arthropathy/Behcet's -- this is his first flare of colitis in several years. I have recommended that he continue prednisone taper 40 mg for 7 days, and then tapering by 5 mg every 5-7 days until off. We will continue Lialda 4.8 g daily. I will stop hydrocortisone suppository. I have recommended colonoscopy for assessment of disease including extent. We discussed the test including the risks and benefits and he is agreeable to proceed. I will check Remicade antibodies as he has been on this medication now for some time. We'll also check a urinalysis given urinary symptom, though this may be secondary to rectal and left-sided colitis.  2. external hemorrhoids -- Analpram 3 times daily, will assess further at colonoscopy  3.  Fibromyalgia -- improved with treatment with Dr. Dierdre Forth, he has followup scheduled  4.   Mild insomnia -- more problems secondary to steroids, he can use previously prescribed alprazolam at bedtime to help with sleep.

## 2013-12-03 NOTE — Patient Instructions (Signed)
Your physician has requested that you go to the basement for the following lab work before leaving today: Remicade antibodies, Urinalysis  Please discontinue hydrocortisone suppositories.  Dr Hilarie Fredrickson has advised that you be on a prednisone taper. The taper instructions are as follows: Prednisone 40 mg daily x 3 days and decrease by 5 mg every 5-7 days thereafter.  You have been scheduled for a colonoscopy. Please follow written instructions given to you at your visit today.  Please pick up your prep kit at the pharmacy within the next 1-3 days. If you use inhalers (even only as needed), please bring them with you on the day of your procedure. Your physician has requested that you go to www.startemmi.com and enter the access code given to you at your visit today. This web site gives a general overview about your procedure. However, you should still follow specific instructions given to you by our office regarding your preparation for the procedure.  Please continue Lialda at 4.8 mg daily.  CC:Dr Hodgin

## 2013-12-05 ENCOUNTER — Encounter: Payer: Self-pay | Admitting: Internal Medicine

## 2013-12-10 LAB — INFLIXIMAB (IFX) CONC+ IFX AB
Anti-Infliximab Antibody: 72 ng/mL — ABNORMAL HIGH
INFLIXIMAB DRUG LEVEL: 7.4 ug/mL

## 2013-12-13 ENCOUNTER — Ambulatory Visit (HOSPITAL_BASED_OUTPATIENT_CLINIC_OR_DEPARTMENT_OTHER)
Admission: RE | Admit: 2013-12-13 | Discharge: 2013-12-13 | Disposition: A | Discharge status: Home / Self Care | Payer: BC Managed Care – PPO | Source: Ambulatory Visit | Attending: Physician Assistant | Admitting: Physician Assistant

## 2013-12-13 ENCOUNTER — Encounter: Payer: Self-pay | Admitting: Physician Assistant

## 2013-12-13 ENCOUNTER — Telehealth: Payer: Self-pay | Admitting: Physician Assistant

## 2013-12-13 ENCOUNTER — Ambulatory Visit (INDEPENDENT_AMBULATORY_CARE_PROVIDER_SITE_OTHER): Payer: BC Managed Care – PPO | Admitting: Physician Assistant

## 2013-12-13 VITALS — BP 120/88 | HR 83 | Temp 98.5°F | Resp 16 | Ht 66.0 in | Wt 205.0 lb

## 2013-12-13 DIAGNOSIS — R0789 Other chest pain: Secondary | ICD-10-CM

## 2013-12-13 DIAGNOSIS — K625 Hemorrhage of anus and rectum: Secondary | ICD-10-CM

## 2013-12-13 DIAGNOSIS — J9819 Other pulmonary collapse: Secondary | ICD-10-CM | POA: Diagnosis not present

## 2013-12-13 DIAGNOSIS — R05 Cough: Secondary | ICD-10-CM | POA: Diagnosis present

## 2013-12-13 DIAGNOSIS — J90 Pleural effusion, not elsewhere classified: Secondary | ICD-10-CM

## 2013-12-13 DIAGNOSIS — Z113 Encounter for screening for infections with a predominantly sexual mode of transmission: Secondary | ICD-10-CM

## 2013-12-13 DIAGNOSIS — R059 Cough, unspecified: Secondary | ICD-10-CM | POA: Diagnosis present

## 2013-12-13 LAB — CBC WITH DIFFERENTIAL/PLATELET
Basophils Absolute: 0 10*3/uL (ref 0.0–0.1)
Basophils Relative: 0 % (ref 0–1)
Eosinophils Absolute: 0 10*3/uL (ref 0.0–0.7)
Eosinophils Relative: 0 % (ref 0–5)
HCT: 42.4 % (ref 39.0–52.0)
Hemoglobin: 15.4 g/dL (ref 13.0–17.0)
LYMPHS PCT: 14 % (ref 12–46)
Lymphs Abs: 1.3 10*3/uL (ref 0.7–4.0)
MCH: 32.6 pg (ref 26.0–34.0)
MCHC: 36.3 g/dL — ABNORMAL HIGH (ref 30.0–36.0)
MCV: 89.6 fL (ref 78.0–100.0)
Monocytes Absolute: 0.4 10*3/uL (ref 0.1–1.0)
Monocytes Relative: 4 % (ref 3–12)
NEUTROS ABS: 7.6 10*3/uL (ref 1.7–7.7)
Neutrophils Relative %: 82 % — ABNORMAL HIGH (ref 43–77)
PLATELETS: 332 10*3/uL (ref 150–400)
RBC: 4.73 MIL/uL (ref 4.22–5.81)
RDW: 12.9 % (ref 11.5–15.5)
WBC: 9.3 10*3/uL (ref 4.0–10.5)

## 2013-12-13 LAB — BASIC METABOLIC PANEL
BUN: 14 mg/dL (ref 6–23)
CHLORIDE: 100 meq/L (ref 96–112)
CO2: 26 meq/L (ref 19–32)
CREATININE: 0.75 mg/dL (ref 0.50–1.35)
Calcium: 9.6 mg/dL (ref 8.4–10.5)
Glucose, Bld: 95 mg/dL (ref 70–99)
POTASSIUM: 4.5 meq/L (ref 3.5–5.3)
Sodium: 136 mEq/L (ref 135–145)

## 2013-12-13 LAB — BRAIN NATRIURETIC PEPTIDE: Brain Natriuretic Peptide: 4.8 pg/mL (ref 0.0–100.0)

## 2013-12-13 LAB — HIV ANTIBODY (ROUTINE TESTING W REFLEX): HIV: NONREACTIVE

## 2013-12-13 MED ORDER — CLOTRIMAZOLE-BETAMETHASONE 1-0.05 % EX CREA: 1.0000 "application " | TOPICAL_CREAM | Freq: Two times a day (BID) | CUTANEOUS | Status: DC

## 2013-12-13 MED ORDER — HYDROCODONE-ACETAMINOPHEN 5-325 MG PO TABS: 1.0000 | ORAL_TABLET | Freq: Four times a day (QID) | ORAL | Status: DC | PRN

## 2013-12-13 MED ORDER — ALPRAZOLAM 1 MG PO TABS: 1.0000 mg | ORAL_TABLET | Freq: Three times a day (TID) | ORAL | Status: DC | PRN

## 2013-12-13 NOTE — Progress Notes (Signed)
Pre visit review using our clinic review tool, if applicable. No additional management support is needed unless otherwise documented below in the visit note/SLS  

## 2013-12-13 NOTE — Progress Notes (Signed)
Patient presents to clinic today for ER followup. Patient is on the schedule today for medication management.  Medication management: Patient due for repeat monitoring lab work. Patient is currently on Truvada for HIV pre-exposure prophylaxis. Is overdue for repeat HIV antibody testing. Will need this before additional refills will be given.  Emergency Department followup: Patient was recently seen at [facility] in [place], [place] with complaints of epigastric and chest pain. Patient with history significant for ulcerative colitis. Patient was evaluated for acute coronary syndrome or other emerging condition. Records have been received by office. Workup including EKG, chest x-ray, CT angiography, troponin, and an additional labs were all unremarkable. Bili abnormality was a mildly elevated white blood cell count of 12. Patient also with history of rheumatoid arthritis on multiple therapies. This could be contributing to mildly elevated WBC. Patient was diagnosed with musculoskeletal chest pain and discharged with pain medication and return precautions. Patient states that he's been having some mild chest pain since this visit several days ago. Denies shortness of breath. Denies palpitations. Denies excess fatigue. Patient states that he was taken off of his Remicade prescription a few days prior to symptom onset. Is wondering if this could be contributing. Patient has followup both with his GI physician, Dr. Elmo Putt, and his rheumatologist, Dr. Amil Amen.  Past Medical History  Diagnosis Date  . GERD (gastroesophageal reflux disease)   . Ulcer   . Blood in stool   . Ulcerative colitis   . Inguinal hernia     bilateral  . Fibromyalgia   . Arthritis   . Insomnia     d/t pain  . Chicken pox   . Hemorrhoids     Current Outpatient Prescriptions on File Prior to Visit  Medication Sig Dispense Refill  . cyclobenzaprine (FLEXERIL) 10 MG tablet Take 10 mg by mouth 2 (two) times  daily as needed for muscle spasms.      . DULoxetine (CYMBALTA) 60 MG capsule Take 60 mg by mouth daily.      . hydrocortisone-pramoxine (ANALPRAM-HC) 2.5-1 % rectal cream Place 1 application rectally 3 (three) times daily.  30 g  0  . mesalamine (LIALDA) 1.2 G EC tablet Take 4.8 g by mouth daily with breakfast.      . polycarbophil (FIBERCON) 625 MG tablet Take 625 mg by mouth daily.      . polyethylene glycol (MIRALAX / GLYCOLAX) packet Take 17 g by mouth daily.      . pregabalin (LYRICA) 75 MG capsule Take 75 mg by mouth 2 (two) times daily.      Marland Kitchen MOVIPREP 100 G SOLR Take 1 kit (200 g total) by mouth once.  1 kit  0   No current facility-administered medications on file prior to visit.    No Known Allergies  Family History  Problem Relation Age of Onset  . Breast cancer Mother   . Hyperlipidemia Maternal Grandmother   . Heart disease Maternal Grandmother   . Lung cancer Maternal Grandfather   . Heart disease Paternal Grandfather   . Heart disease Paternal Grandmother   . Diverticulitis Maternal Grandmother   . Diabetes Maternal Aunt   . Heart disease Paternal Uncle   . Healthy Brother     x3  . Healthy Sister     x2    History   Social History  . Marital Status: Single    Spouse Name: N/A    Number of Children: 0  . Years of Education: N/A  Occupational History  . business analyst    Social History Main Topics  . Smoking status: Former Smoker -- 1.00 packs/day for 8 years    Types: Cigarettes    Quit date: 01/27/2011  . Smokeless tobacco: Former Systems developer    Types: Snuff    Quit date: 01/27/2011     Comment: forme user of1/2 can daily- 1/2 ppd- Quit 01/27/2011  . Alcohol Use: 0.6 oz/week    1 Glasses of wine per week     Comment: occasionally   . Drug Use: No  . Sexual Activity: Yes    Birth Control/ Protection: Condom     Comment: male   Other Topics Concern  . None   Social History Narrative   Regular exercise: gym daily   Caffeine use: 5 cups of coffee  everyday     Review of Systems - See HPI.  All other ROS are negative.  BP 120/88  Pulse 83  Temp(Src) 98.5 F (36.9 C) (Oral)  Resp 16  Ht 5' 6"  (1.676 m)  Wt 205 lb (92.987 kg)  BMI 33.10 kg/m2  SpO2 97%  Physical Exam  Constitutional: He is oriented to person, place, and time and well-developed, well-nourished, and in no distress.  HENT:  Head: Normocephalic and atraumatic.  Right Ear: External ear normal.  Left Ear: External ear normal.  Nose: Nose normal.  Mouth/Throat: Oropharynx is clear and moist. No oropharyngeal exudate.  Tympanic membranes within normal limits bilaterally.  Eyes: Conjunctivae and EOM are normal. Pupils are equal, round, and reactive to light.  Neck: Neck supple. No thyromegaly present.  Cardiovascular: Normal rate, regular rhythm, normal heart sounds and intact distal pulses.   Pulmonary/Chest: Effort normal and breath sounds normal. No respiratory distress. He has no wheezes. He has no rales. He exhibits tenderness.  Lymphadenopathy:    He has no cervical adenopathy.  Neurological: He is alert and oriented to person, place, and time.  Skin: Skin is warm and dry. No rash noted.  Psychiatric: Affect normal.    Recent Results (from the past 2160 hour(s))  CBC WITH DIFFERENTIAL     Status: Abnormal   Collection Time    11/30/13 12:34 PM      Result Value Ref Range   WBC 5.1  4.0 - 10.5 K/uL   RBC 4.84  4.22 - 5.81 MIL/uL   Hemoglobin 15.9  13.0 - 17.0 g/dL   HCT 45.5  39.0 - 52.0 %   MCV 94.0  78.0 - 100.0 fL   MCH 32.9  26.0 - 34.0 pg   MCHC 34.9  30.0 - 36.0 g/dL   RDW 12.5  11.5 - 15.5 %   Platelets 184  150 - 400 K/uL   Neutrophils Relative % 42 (*) 43 - 77 %   Neutro Abs 2.2  1.7 - 7.7 K/uL   Lymphocytes Relative 39  12 - 46 %   Lymphs Abs 2.0  0.7 - 4.0 K/uL   Monocytes Relative 17 (*) 3 - 12 %   Monocytes Absolute 0.9  0.1 - 1.0 K/uL   Eosinophils Relative 1  0 - 5 %   Eosinophils Absolute 0.1  0.0 - 0.7 K/uL   Basophils  Relative 1  0 - 1 %   Basophils Absolute 0.0  0.0 - 0.1 K/uL  COMPREHENSIVE METABOLIC PANEL     Status: Abnormal   Collection Time    11/30/13 12:34 PM      Result Value Ref Range   Sodium  141  137 - 147 mEq/L   Potassium 4.4  3.7 - 5.3 mEq/L   Chloride 101  96 - 112 mEq/L   CO2 27  19 - 32 mEq/L   Glucose, Bld 91  70 - 99 mg/dL   BUN 12  6 - 23 mg/dL   Creatinine, Ser 0.92  0.50 - 1.35 mg/dL   Calcium 9.5  8.4 - 10.5 mg/dL   Total Protein 8.6 (*) 6.0 - 8.3 g/dL   Albumin 3.7  3.5 - 5.2 g/dL   AST 32  0 - 37 U/L   ALT 32  0 - 53 U/L   Alkaline Phosphatase 69  39 - 117 U/L   Total Bilirubin 0.6  0.3 - 1.2 mg/dL   GFR calc non Af Amer >90  >90 mL/min   GFR calc Af Amer >90  >90 mL/min   Comment: (NOTE)     The eGFR has been calculated using the CKD EPI equation.     This calculation has not been validated in all clinical situations.     eGFR's persistently <90 mL/min signify possible Chronic Kidney     Disease.   Anion gap 13  5 - 15  LIPASE, BLOOD     Status: None   Collection Time    11/30/13 12:34 PM      Result Value Ref Range   Lipase 24  11 - 59 U/L  POC OCCULT BLOOD, ED     Status: Abnormal   Collection Time    11/30/13  1:47 PM      Result Value Ref Range   Fecal Occult Bld POSITIVE (*) NEGATIVE  GASTROINTESTINAL PATHOGEN PANEL PCR     Status: None   Collection Time    12/02/13  8:03 AM      Result Value Ref Range   Campylobacter, PCR Negative     C. difficile Tox A/B, PCR Negative     E coli 0157, PCR Negative     E coli (ETEC) LT/ST PCR Negative     E coli (STEC) stx1/stx2, PCR Negative     Salmonella, PCR Negative     Shigella, PCR Negative     Norovirus, PCR Negative     Rotavirus A, PCR Negative     Giardia lamblia, PCR Negative     Cryptosporidium, PCR Negative     Comment:         ** Normal Reference Range for each Analyte: Not Detected **                 The detection and identification of specific gastrointestinal     microbial nucleic acid from  individuals exhibiting signs and     symptoms of gastrointestinal infection aids in the diagnosis     of gastrointestinal infection when used in conjunction with     clinical evaluation, laboratory findings, and epidemiological     information.           ** The xTAG Gastrointestinal Pathogen Panel results are presumptive     and must be confirmed by FDA-cleared tests or other acceptable     reference methods.  The results of this test should not be used as     the sole basis for diagnosis, treatment, or other patient management     decisions.           Performed using the [employer] xTAG Gastrointestinal Pathogen Panel test     kit.  INFLIXIMAB (IFX) CONC+ IFX AB  Status: Abnormal   Collection Time    12/03/13  9:29 AM      Result Value Ref Range   Infliximab Drug Level 7.4     Comment: Reference Range:     Quantitation Limit: <0.4 ug/mL     Results of 0.4 or higher indicates detection of Infliximab.     In the presence of anti-Infliximab antibodies, the     Infliximab drug level reflects the antibody-unbound fraction     of Infliximab concentration in serum.   Anti-Infliximab Antibody 72 (*)    Comment: Reference Range:     Quantitation Limit: <22 ng/mL     Results of 22 or higher indicates detection of     Anti-Infliximab Antibodies.  HIV ANTIBODY (ROUTINE TESTING)     Status: None   Collection Time    12/13/13  2:09 PM      Result Value Ref Range   HIV 1&2 Ab, 4th Generation NONREACTIVE  NONREACTIVE   Comment:       A NONREACTIVE HIV Ag/Ab result does not exclude HIV infection since     the time frame for seroconversion is variable. If acute HIV infection     is suspected, a HIV-1 RNA Qualitative TMA test is recommended.           HIV-1/2 Antibody Diff         Not indicated.     HIV-1 RNA, Qual TMA           Not indicated.           PLEASE NOTE: This information has been disclosed to you from records     whose confidentiality may be protected by state law. If your state      requires such protection, then the state law prohibits you from making     any further disclosure of the information without the specific written     consent of the person to whom it pertains, or as otherwise permitted     by law. A general authorization for the release of medical or other     information is NOT sufficient for this purpose.           The performance of this assay has not been clinically validated in     patients less than 18 years old.  CBC WITH DIFFERENTIAL     Status: Abnormal   Collection Time    12/13/13  2:09 PM      Result Value Ref Range   WBC 9.3  4.0 - 10.5 K/uL   RBC 4.73  4.22 - 5.81 MIL/uL   Hemoglobin 15.4  13.0 - 17.0 g/dL   HCT 42.4  39.0 - 52.0 %   MCV 89.6  78.0 - 100.0 fL   MCH 32.6  26.0 - 34.0 pg   MCHC 36.3 (*) 30.0 - 36.0 g/dL   RDW 12.9  11.5 - 15.5 %   Platelets 332  150 - 400 K/uL   Neutrophils Relative % 82 (*) 43 - 77 %   Neutro Abs 7.6  1.7 - 7.7 K/uL   Lymphocytes Relative 14  12 - 46 %   Lymphs Abs 1.3  0.7 - 4.0 K/uL   Monocytes Relative 4  3 - 12 %   Monocytes Absolute 0.4  0.1 - 1.0 K/uL   Eosinophils Relative 0  0 - 5 %   Eosinophils Absolute 0.0  0.0 - 0.7 K/uL   Basophils Relative 0  0 - 1 %  Basophils Absolute 0.0  0.0 - 0.1 K/uL   Smear Review Criteria for review not met    BASIC METABOLIC PANEL     Status: None   Collection Time    12/13/13  2:09 PM      Result Value Ref Range   Sodium 136  135 - 145 mEq/L   Potassium 4.5  3.5 - 5.3 mEq/L   Chloride 100  96 - 112 mEq/L   CO2 26  19 - 32 mEq/L   Glucose, Bld 95  70 - 99 mg/dL   BUN 14  6 - 23 mg/dL   Creat 0.75  0.50 - 1.35 mg/dL   Calcium 9.6  8.4 - 10.5 mg/dL  BRAIN NATRIURETIC PEPTIDE     Status: None   Collection Time    12/13/13  2:09 PM      Result Value Ref Range   Brain Natriuretic Peptide 4.8  0.0 - 100.0 pg/mL    Assessment/Plan: Atypical chest pain Pretty significant workup as ordered and performed, ruling out acute coronary syndrome. Will  order repeat chest x-ray as results were not included in records received. EKG in clinic reveals NSR with anterolateral ST elevation.  Cardiology consulted. On-call physician (Dr. Burt Knack) feels that EKG changes are in early repolarization variant, that is normal for this patient. Agree with ER physician that this chest pain is either musculoskeletal in nature, or that it is related to flare up of his UC. Patient already with appropriate specialty followup. Norco for pain. Discussed alarm symptoms with patient, and went to proceed to the ER.  Rectal bleeding Mild. Has resolved. Patient with history of ulcerative colitis. Will obtain CBC and BMP. Patient has appointment with gastroenterology for further evaluation.  Routine screening for STI (sexually transmitted infection) We'll check HIV antibody. If negative, will refill her Truvada. Followup in 6 months.

## 2013-12-13 NOTE — Telephone Encounter (Signed)
LMOM to reassure patient the hospital records were received from [place].  Xray today looks good. He had a normal CTA at the hospital. No sign of heart failure, pneumonia.  Again he is to continue care as discussed at today's visit. He will be contacted by cardiology for evaluation and stress test.  I will call him once other labs have resulted.

## 2013-12-13 NOTE — Patient Instructions (Signed)
I will call you this weekend with your results.  The Cardiologist has looked at your EKG and thinks it is fine.  Go downstairs for your Chest x-ray.  Continue medications as directed.  Take Xanax 1 mg three times daily.  Take Norco as needed.  Rest.  No caffeine.  Follow-up with specialists as directed.  You will be contacted for evaluation by Cardiology.  If symptoms worsen, please proceed to the ER.

## 2013-12-14 ENCOUNTER — Telehealth: Payer: Self-pay | Admitting: Physician Assistant

## 2013-12-14 DIAGNOSIS — Z299 Encounter for prophylactic measures, unspecified: Secondary | ICD-10-CM

## 2013-12-14 MED ORDER — EMTRICITABINE-TENOFOVIR DF 200-300 MG PO TABS: 1.0000 | ORAL_TABLET | Freq: Every day | ORAL | Status: DC

## 2013-12-14 NOTE — Telephone Encounter (Signed)
All labs look good.  As stated on Friday when I called and left message on his answering machine, [facility].  Had CXR,CTA, EKG and labs that were unremarkable for acute cardiac cause of symptoms.  Repeat CXR in our office was good.  HE is to follow-up with Dr. Rhea BeltonPyrtle and Dr. Farrel ConnersBickman as scheduled.  Again, he will be contacted by Cardiology for further evaluation.  HIV Ab was negative so I have sent in a refill of his Truvada.

## 2013-12-15 ENCOUNTER — Other Ambulatory Visit: Payer: Self-pay | Admitting: Internal Medicine

## 2013-12-16 ENCOUNTER — Encounter: Payer: BC Managed Care – PPO | Admitting: Internal Medicine

## 2013-12-16 NOTE — Telephone Encounter (Signed)
LMOM with contact name and number [for return call, if needed] RE: Results and follow-up per provider instructions/SLS  [facility] Triage Call Report Triage Record Num: [id number] Operator: Donnella Sham Patient Name: Keith Walker Call Date & Time: 12/13/2013 9:25:40PM Patient Phone: [phone] PCP: Chrissie Noa 'Selena Batten' Daphine Deutscher Patient Gender: Male PCP Fax : [phone] Patient DOB: 03-01-1986 Practice Name: [facility] Reason for Call: Caller: [facility]; PCP: Piedad Climes"; CB#: [phone]; Call regarding CBC; drawn STAT 12/13/13 at 1409; no criticals on labs; per profile, office will f/u with pt 12/15/13 Protocol(s) Used: Office Note Recommended Outcome per Protocol: Information Noted and Sent to Office Reason for Outcome: Caller information to office Care Advice: ~ 12/13/2013 9:33:23PM Page 1 of 1 [email or web address]

## 2013-12-19 ENCOUNTER — Telehealth: Payer: Self-pay | Admitting: Internal Medicine

## 2013-12-20 NOTE — Telephone Encounter (Signed)
Pt calling to schedule appt with Dr. Rhea Belton. Do you want him worked in sooner than 1st available? Please advise.

## 2013-12-23 ENCOUNTER — Other Ambulatory Visit: Payer: Self-pay | Admitting: *Deleted

## 2013-12-23 DIAGNOSIS — Z299 Encounter for prophylactic measures, unspecified: Secondary | ICD-10-CM

## 2013-12-23 MED ORDER — EMTRICITABINE-TENOFOVIR DF 200-300 MG PO TABS: 1.0000 | ORAL_TABLET | Freq: Every day | ORAL | Status: DC

## 2013-12-23 NOTE — Telephone Encounter (Signed)
Would add on Sept 9 at 11:15 AM

## 2013-12-24 NOTE — Telephone Encounter (Signed)
Patient aware of the appointment.

## 2013-12-26 DIAGNOSIS — Z113 Encounter for screening for infections with a predominantly sexual mode of transmission: Secondary | ICD-10-CM | POA: Insufficient documentation

## 2013-12-26 DIAGNOSIS — R0789 Other chest pain: Secondary | ICD-10-CM | POA: Insufficient documentation

## 2013-12-26 DIAGNOSIS — K625 Hemorrhage of anus and rectum: Secondary | ICD-10-CM | POA: Insufficient documentation

## 2013-12-26 NOTE — Assessment & Plan Note (Signed)
Mild. Has resolved. Patient with history of ulcerative colitis. Will obtain CBC and BMP. Patient has appointment with gastroenterology for further evaluation.

## 2013-12-26 NOTE — Assessment & Plan Note (Signed)
Pretty significant workup as ordered and performed, ruling out acute coronary syndrome. Will order repeat chest x-ray as results were not included in records received. EKG in clinic reveals NSR with anterolateral ST elevation.  Cardiology consulted. On-call physician (Dr. Excell Seltzer) feels that EKG changes are in early repolarization variant, that is normal for this patient. Agree with ER physician that this chest pain is either musculoskeletal in nature, or that it is related to flare up of his UC. Patient already with appropriate specialty followup. Norco for pain. Discussed alarm symptoms with patient, and went to proceed to the ER.

## 2013-12-26 NOTE — Assessment & Plan Note (Signed)
We'll check HIV antibody. If negative, will refill her Truvada. Followup in 6 months.

## 2014-01-01 ENCOUNTER — Ambulatory Visit (INDEPENDENT_AMBULATORY_CARE_PROVIDER_SITE_OTHER): Payer: BC Managed Care – PPO | Admitting: Internal Medicine

## 2014-01-01 ENCOUNTER — Encounter: Payer: Self-pay | Admitting: Internal Medicine

## 2014-01-01 VITALS — BP 116/82 | HR 104 | Ht 66.0 in | Wt 201.5 lb

## 2014-01-01 DIAGNOSIS — K5190 Ulcerative colitis, unspecified, without complications: Secondary | ICD-10-CM

## 2014-01-01 DIAGNOSIS — M797 Fibromyalgia: Secondary | ICD-10-CM

## 2014-01-01 DIAGNOSIS — M352 Behcet's disease: Secondary | ICD-10-CM

## 2014-01-01 DIAGNOSIS — R079 Chest pain, unspecified: Secondary | ICD-10-CM

## 2014-01-01 DIAGNOSIS — IMO0001 Reserved for inherently not codable concepts without codable children: Secondary | ICD-10-CM

## 2014-01-01 DIAGNOSIS — R21 Rash and other nonspecific skin eruption: Secondary | ICD-10-CM

## 2014-01-01 NOTE — Progress Notes (Signed)
Subjective:    Patient ID: Keith Walker, male    DOB: 12/15/1985, 28 y.o.   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 I saw him last on 11:15 at which point he was having symptoms consistent with a flare of ulcerative colitis. He was scheduled for colonoscopy earlier in September but this was canceled after he became sick with chest pain and shortness of breath in the days before this exam. 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 Dierdre Forth and been started on Humira. Humira was started 40 mg 13 days ago and he has his second dose tomorrow. He reports from an abdominal standpoint he has less overall abdominal pain and significantly less rectal bleeding. Bowel movements have become less frequent but continued to be urgent and at times very difficult to control. He is having 1-2 soft bowel movements daily. Blood had gone away for several days but he had slight red blood with wiping this morning. No fevers or chills. He has developed a red rash under his left eye which initially was diagnosed as tinea. Lotrisone was started without much response about a month ago. He continues to wean down prednisone and is now at 20 mg daily. Sleep cycle has been disrupted. He is using Xanax at bedtime on occasion to help with sleep. Appetite has been somewhat decreased but he denies nausea or vomiting. He has noticed small ulcers around his lip and occasionally nodules at his fingertips. Joint pains tend to be worse with weather but overall improved significantly with the addition of Cymbalta and Lyrica. He has noticed increased somnolence with Lyrica which she plans to discuss with Dr. Dierdre Forth.  He was traveling in [place] and he developed fairly acute onset of left-sided chest pain radiating down his left arm  associated with shortness of breath. Reportedly CT scan was performed there negative for PE but raise the question of possible mild pulmonary edema. He reports that the chest soreness lasted for several days worse with moving and lying down. He has not been seen by cardiology. He does have a significant family history of CAD on both sides of his family.  He remains off methotrexate which was stopped in favor of Cymbalta and Lyrica as discussed above.  He wishes to establish care with a primary doctor and specifically asks for Dr. Yetta Barre. He has been seeing a Advice worker with [facility], which he likes, but would also like to have an established MD.  He was previously seen by Dr. Ty Hilts before his medical leave.    Review of Systems As per history of present illness, otherwise negative  Current Medications, Allergies, Past Medical History, Past Surgical History, Family History and Social History were reviewed in [employer] record.     Objective:   Physical Exam BP 116/82  Pulse 104  Ht  (1.676 m)  Wt 201 lb 8 oz (91.4 kg)  BMI 32.54 kg/m2 Constitutional: Well-developed and well-nourished. No distress. HEENT: Normocephalic and atraumatic. Oropharynx is clear and moist. No oropharyngeal exudate. Conjunctivae are normal.  No scleral icterus. Healing red spot lower lip vermilion in the center Neck: Neck supple. Trachea midline. Cardiovascular: Normal rate, regular rhythm and intact distal pulses. No M/R/G Pulmonary/chest: Effort normal and breath sounds normal. No wheezing, rales or rhonchi. Abdominal: Soft, diffusely tender without rebound  or guarding, nondistended. Bowel sounds active throughout. Extremities: no clubbing, cyanosis, or edema Lymphadenopathy: No cervical adenopathy noted. Neurological: Alert and oriented to person place and time. Skin: Skin is warm and dry. Slightly raised linear in half-moon shaped red rash under her left eye over maxillary  sinus, nontender Psychiatric: Normal mood and affect. Behavior is normal.  CBC    Component Value Date/Time   WBC 9.3 12/13/2013 1409   RBC 4.73 12/13/2013 1409   HGB 15.4 12/13/2013 1409   HCT 42.4 12/13/2013 1409   PLT 332 12/13/2013 1409   MCV 89.6 12/13/2013 1409   MCH 32.6 12/13/2013 1409   MCHC 36.3* 12/13/2013 1409   RDW 12.9 12/13/2013 1409   LYMPHSABS 1.3 12/13/2013 1409   MONOABS 0.4 12/13/2013 1409   EOSABS 0.0 12/13/2013 1409   BASOSABS 0.0 12/13/2013 1409    CMP     Component Value Date/Time   NA 136 12/13/2013 1409   K 4.5 12/13/2013 1409   CL 100 12/13/2013 1409   CO2 26 12/13/2013 1409   GLUCOSE 95 12/13/2013 1409   BUN 14 12/13/2013 1409   CREATININE 0.75 12/13/2013 1409   CREATININE 0.92 11/30/2013 1234   CALCIUM 9.6 12/13/2013 1409   PROT 8.6* 11/30/2013 1234   ALBUMIN 3.7 11/30/2013 1234   AST 32 11/30/2013 1234   ALT 32 11/30/2013 1234   ALKPHOS 69 11/30/2013 1234   BILITOT 0.6 11/30/2013 1234   GFRNONAA >90 11/30/2013 1234   GFRAA >90 11/30/2013 1234   CLINICAL DATA:  Cough and chest pain.   EXAM: CHEST  2 VIEW   COMPARISON:  Abdominal radiographs 11/30/2013   FINDINGS: Heart and mediastinum are within normal limits. The trachea is midline. There are linear densities at the lung bases that are most compatible with atelectasis. No evidence for airspace disease or pleural effusions. No acute bone abnormalities.   IMPRESSION: Basilar atelectasis.  No evidence for airspace disease.     Electronically Signed   By: Richarda Overlie M.D.   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  1. UC with inflammatory arthropathy/Behcet's -- his colitis symptoms have improved though he is still having tenesmus and urgency. I expect he still has some degree of inflammation. I would like to repeat the colonoscopy at this time to  assess the activity and extent of his disease. He will continue Lialda 4.8 g daily and continue the prednisone taper by decreasing by 5 mg every 7 days. He is now on Humira after having Remicade antibody so hopefully this will be more effective for him. Colonoscopy again discussed including the risks and benefits and he is agreeable to proceed.  2. Chest pain -- unclear etiology, given his IBD/inflammatory arthropathy, the question of pericarditis is raised by me. I would like for him to be seen by cardiology for more thorough evaluation and he is in complete agreement. He will be referred to cardiology.  3. fibromyalgia/inflammatory arthropathy -- he will continue to follow with Dr. Dierdre Forth and continue medications as prescribed by Dr. Dierdre Forth. He will discuss somnolence as it relates to Cymbalta and Lyrica, but certainly some of his fatigue could be related to ongoing UC flare  4.  Facial rash -- this looks less like tinea to me and more like a rash associated with his autoimmune conditions, question malar rash. It has  not responded to Lotrisone as one would expect of tinea. Dr. Dierdre Forth was planning to refer him to [facility] dermatology regarding the nodules on his fingers, and they can also address this new facial rash. I agree with this referral  Return to clinic based on findings at colonoscopy.

## 2014-01-01 NOTE — Patient Instructions (Addendum)
Continue to wean Prednisone as directed.  Continue your Humira and Lialda  You have been scheduled for a colonoscopy. Please follow written instructions given to you at your visit today.  Please pick up your prep kit at the pharmacy within the next 1-3 days. If you use inhalers (even only as needed), please bring them with you on the day of your procedure. Your physician has requested that you go to www.startemmi.com and enter the access code given to you at your visit today. This web site gives a general overview about your procedure. However, you should still follow specific instructions given to you by our office regarding your preparation for the procedure.   You have been scheduled for a consultation with Dr. Mare Ferrari at [facility] on 01/24/2014 at 11:00am.  Please arrive 15 minutes early

## 2014-01-13 ENCOUNTER — Telehealth: Payer: Self-pay | Admitting: Internal Medicine

## 2014-01-13 NOTE — Telephone Encounter (Signed)
Pt states the rash on his face has gotten worse. Pt is asking about a dermatology referral. Call placed to Dr. Shawnee Knapp office to check on the status of derm referral from their office.

## 2014-01-13 NOTE — Telephone Encounter (Signed)
Spoke with Dr. Shawnee Knapp nurse and they are referring the pt to a dermatolgist at [facility] Pt aware.

## 2014-01-15 ENCOUNTER — Telehealth: Payer: Self-pay | Admitting: Internal Medicine

## 2014-01-15 NOTE — Telephone Encounter (Signed)
This is okay and happens some time with rapid transit He has a colonoscopy upcoming I believe to check status of his IBD Dr. Yetta Barre downstairs agreed to take him as a new pt, so hopefully he has heard from them re: PCP appt

## 2014-01-15 NOTE — Telephone Encounter (Signed)
Pt states that for the past couple of days he has been seeing bits of his lialda in his stool. States the am the whole lialda was passed through his stool. States his stools are loose. Please advise.

## 2014-01-15 NOTE — Telephone Encounter (Signed)
Left message for pt to call back.  Spoke with pt and he is aware. 

## 2014-01-20 ENCOUNTER — Ambulatory Visit: Payer: BC Managed Care – PPO | Admitting: Internal Medicine

## 2014-01-24 ENCOUNTER — Ambulatory Visit (INDEPENDENT_AMBULATORY_CARE_PROVIDER_SITE_OTHER): Payer: BC Managed Care – PPO | Admitting: Cardiology

## 2014-01-24 ENCOUNTER — Encounter: Payer: Self-pay | Admitting: Cardiology

## 2014-01-24 VITALS — BP 122/86 | HR 92 | Ht 66.0 in | Wt 200.0 lb

## 2014-01-24 DIAGNOSIS — R0789 Other chest pain: Secondary | ICD-10-CM

## 2014-01-24 NOTE — Patient Instructions (Addendum)
Your physician has requested that you have an exercise tolerance test. For further information please visit www.cardiosmart.org. Please also follow instruction sheet, as given.  Your physician recommends that you continue on your current medications as directed. Please refer to the Current Medication list given to you today.  Follow up as needed  

## 2014-01-24 NOTE — Progress Notes (Signed)
Keith Walker Date of Birth:  26-May-1985 [place] [address] [place], [place]  [address] [phone]        Fax   [phone]   History of Present Illness: This pleasant 28 year old young man is seen by me for the first time today.  He is seen at the request of Dr.Pyrtle.  He is seen for evaluation of chest pain.  The patient does not have any history of known heart problems.  In mid August 2015 wall traveling to [facility] the patient experienced some left-sided chest discomfort and went to the emergency room.  His electrocardiogram showed evidence of benign early repolarization.  He reports that he also had a CT scan to rule out pulmonary emboli.  Since returning to [facility] the patient has continued to have some mild left-sided discomfort.  It is worse with movement such as going from a lying to a sitting position.  It is not clearly worse with exercise such as brisk walking.  The patient has had occasional palpitations.  He has not had any syncope.  He is not having any symptoms of congestive heart failure. His family history reveals that his parents are living and to not have heart problems although he is grandparents and uncles and aunts do have a history of premature coronary disease.  The patient himself is not diabetic and his cholesterol has been normal.  He does smoke less than half a pack of cigarettes a day.  He drinks occasional alcohol.  He works as a Gaffer.  He does not get any regular interventional exercise.  His weight has been stable. His past history reveals a history of ulcerative colitis and a history of questionable Behcet's disease.  Dr.Pyrtle follows him for his ulcerative colitis and Dr. Amil Amen is his rheumatologist.  Current Outpatient Prescriptions  Medication Sig Dispense Refill  . Adalimumab (HUMIRA PEN) 40 MG/0.8ML PNKT Inject 40 mg into the muscle every 21 ( twenty-one) days. Patient adminsters IM himself       . ALPRAZolam (XANAX) 1 MG tablet Take 1 tablet (1 mg total) by mouth 3 (three) times daily as needed for anxiety or sleep.  60 tablet  0  . carisoprodol (SOMA) 350 MG tablet Take 350 mg by mouth 4 (four) times daily as needed for muscle spasms.      . clotrimazole-betamethasone (LOTRISONE) cream Apply 1 application topically 2 (two) times daily.  45 g  0  . cyclobenzaprine (FLEXERIL) 10 MG tablet Take 10 mg by mouth 2 (two) times daily as needed for muscle spasms.      . DULoxetine (CYMBALTA) 60 MG capsule Take 60 mg by mouth daily.      Marland Kitchen emtricitabine-tenofovir (TRUVADA) 200-300 MG per tablet Take 1 tablet by mouth daily.  30 tablet  0  . HYDROcodone-acetaminophen (NORCO/VICODIN) 5-325 MG per tablet Take 1 tablet by mouth every 6 (six) hours as needed for moderate pain.  30 tablet  0  . hydrocortisone-pramoxine (ANALPRAM-HC) 2.5-1 % rectal cream Place 1 application rectally 3 (three) times daily.  30 g  0  . mesalamine (LIALDA) 1.2 G EC tablet Take 4.8 g by mouth daily with breakfast.      . MOVIPREP 100 G SOLR Take 1 kit (200 g total) by mouth once.  1 kit  0  . oxyCODONE-acetaminophen (PERCOCET) 10-325 MG per tablet Take 1 tablet by mouth every 4 (four) hours as needed for pain.      . predniSONE (  DELTASONE) 10 MG tablet TAKE 4 TABLETS BY MOUTH EVERY DAY  60 tablet  0  . pregabalin (LYRICA) 75 MG capsule Take 75 mg by mouth 2 (two) times daily.      . RESTASIS 0.05 % ophthalmic emulsion       . polycarbophil (FIBERCON) 625 MG tablet Take 625 mg by mouth daily.      . polyethylene glycol (MIRALAX / GLYCOLAX) packet Take 17 g by mouth daily.       No current facility-administered medications for this visit.    No Known Allergies  Patient Active Problem List   Diagnosis Date Noted  . Atypical chest pain 12/26/2013  . Rectal bleeding 12/26/2013  . Routine screening for STI (sexually transmitted infection) 12/26/2013  . Asthma due to seasonal allergies 08/07/2013  . Sinusitis 08/07/2013    . Prophylactic measure 06/20/2013  . Need for prophylactic measure 03/27/2013  . OSA (obstructive sleep apnea) 04/03/2012  . Inguinal hernia 02/22/2012  . Annual physical exam 02/22/2012  . Anxiety 09/26/2010  . Ulcerative colitis 08/22/2010    History  Smoking status  . Former Smoker -- 1.00 packs/day for 8 years  . Types: Cigarettes  . Quit date: 01/27/2011  Smokeless tobacco  . Former Systems developer  . Types: Snuff  . Quit date: 01/27/2011    Comment: forme user of1/2 can daily- 1/2 ppd- Quit 01/27/2011    History  Alcohol Use  . 0.6 oz/week  . 1 Glasses of wine per week    Comment: occasionally     Family History  Problem Relation Age of Onset  . Breast cancer Mother   . Hyperlipidemia Maternal Grandmother   . Heart disease Maternal Grandmother   . Lung cancer Maternal Grandfather   . Heart disease Paternal Grandfather   . Heart disease Paternal Grandmother   . Diverticulitis Maternal Grandmother   . Diabetes Maternal Aunt   . Heart disease Paternal Uncle   . Healthy Brother     x3  . Healthy Sister     x2    Review of Systems: Constitutional: no fever chills diaphoresis or fatigue or change in weight.  Head and neck: no hearing loss, no epistaxis, no photophobia or visual disturbance. Respiratory: No cough, shortness of breath or wheezing. Cardiovascular: No  peripheral edema, palpitations.  Positive for left-sided anterior chest pain Gastrointestinal: No abdominal distention, no abdominal pain, no change in bowel habits hematochezia or melena. Genitourinary: No dysuria, no frequency, no urgency, no nocturia. Musculoskeletal:No arthralgias, no back pain, no gait disturbance or myalgias. Neurological: No dizziness, no headaches, no numbness, no seizures, no syncope, no weakness, no tremors. Hematologic: No lymphadenopathy, no easy bruising. Psychiatric: No confusion, no hallucinations, no sleep disturbance.    Physical Exam: Filed Vitals:   01/24/14 0906  BP:  122/86  Pulse: 92  The patient appears to be in no distress.  Head and neck exam reveals that the pupils are equal and reactive.  The extraocular movements are full.  There is no scleral icterus.  Mouth and pharynx are benign.  No lymphadenopathy.  No carotid bruits.  The jugular venous pressure is normal.  Thyroid is not enlarged or tender.  Chest is clear to percussion and auscultation.  No rales or rhonchi.  Expansion of the chest is symmetrical.  Heart reveals no abnormal lift or heave.  First and second heart sounds are normal.  There is no murmur gallop rub or click.  The abdomen is soft and nontender.  Bowel sounds are  normoactive.  There is no hepatosplenomegaly or mass.  There are no abdominal bruits.  Extremities reveal no phlebitis or edema.  Pedal pulses are good.  There is no cyanosis or clubbing.  Neurologic exam is normal strength and no lateralizing weakness.  No sensory deficits.  Integument reveals no rash  EKG shows normal sinus rhythm and is within normal limits.  He does have mild benign early repolarization unchanged from 12/13/13  Assessment / Plan: 1. atypical chest pain, likely noncardiac 2. family history of premature coronary disease 3. history of ulcerative colitis 4. history of possible Behcet's syndrome  Disposition: We will have the patient return for a treadmill stress test.  No new prescriptions were started today. Many thanks for the opportunity to see this pleasant gentleman with you.

## 2014-01-29 ENCOUNTER — Other Ambulatory Visit: Payer: Self-pay | Admitting: *Deleted

## 2014-01-29 MED ORDER — MESALAMINE 1.2 G PO TBEC: 4.8000 g | DELAYED_RELEASE_TABLET | Freq: Every day | ORAL | Status: DC

## 2014-02-05 ENCOUNTER — Ambulatory Visit (INDEPENDENT_AMBULATORY_CARE_PROVIDER_SITE_OTHER): Payer: BC Managed Care – PPO | Admitting: Internal Medicine

## 2014-02-05 ENCOUNTER — Encounter: Payer: Self-pay | Admitting: Internal Medicine

## 2014-02-05 VITALS — BP 118/72 | HR 80 | Temp 98.2°F | Resp 16 | Ht 66.0 in | Wt 210.0 lb

## 2014-02-05 DIAGNOSIS — J019 Acute sinusitis, unspecified: Secondary | ICD-10-CM

## 2014-02-05 DIAGNOSIS — Z23 Encounter for immunization: Secondary | ICD-10-CM

## 2014-02-05 DIAGNOSIS — R21 Rash and other nonspecific skin eruption: Secondary | ICD-10-CM

## 2014-02-05 DIAGNOSIS — Z418 Encounter for other procedures for purposes other than remedying health state: Secondary | ICD-10-CM

## 2014-02-05 DIAGNOSIS — Z299 Encounter for prophylactic measures, unspecified: Secondary | ICD-10-CM

## 2014-02-05 DIAGNOSIS — B9689 Other specified bacterial agents as the cause of diseases classified elsewhere: Secondary | ICD-10-CM

## 2014-02-05 DIAGNOSIS — B358 Other dermatophytoses: Secondary | ICD-10-CM | POA: Insufficient documentation

## 2014-02-05 MED ORDER — EMTRICITABINE-TENOFOVIR DF 200-300 MG PO TABS: 1.0000 | ORAL_TABLET | Freq: Every day | ORAL | Status: DC

## 2014-02-05 MED ORDER — DESONIDE 0.05 % EX LOTN: TOPICAL_LOTION | Freq: Two times a day (BID) | CUTANEOUS | Status: DC

## 2014-02-05 MED ORDER — AMOXICILLIN 875 MG PO TABS: 875.0000 mg | ORAL_TABLET | Freq: Two times a day (BID) | ORAL | Status: DC

## 2014-02-05 NOTE — Progress Notes (Signed)
Subjective:    Patient ID: Keith LeventhalNickolas Walker, male    DOB: 09/30/1985, 28 y.o.   MRN: [id number]  Rash This is a recurrent problem. The current episode started more than 1 month ago. The problem is unchanged. The affected locations include the face. The rash is characterized by redness, scaling, swelling, itchiness and dryness. Associated with: neosporin. Associated symptoms include congestion and rhinorrhea. Pertinent negatives include no anorexia, cough, diarrhea, eye pain, facial edema, fatigue, fever, joint pain, nail changes, shortness of breath, sore throat or vomiting. Past treatments include topical steroids (lotrisone). The treatment provided mild relief. His past medical history is significant for allergies and asthma. There is no history of eczema or varicella.      Review of Systems  Constitutional: Negative.  Negative for fever, chills, diaphoresis, appetite change and fatigue.  HENT: Positive for congestion, postnasal drip, rhinorrhea, sinus pressure and sneezing. Negative for ear pain, facial swelling, mouth sores, nosebleeds, sore throat, trouble swallowing and voice change.   Eyes: Negative.  Negative for pain.  Respiratory: Negative.  Negative for apnea, cough, choking, chest tightness, shortness of breath, wheezing and stridor.   Cardiovascular: Negative.  Negative for chest pain, palpitations and leg swelling.  Gastrointestinal: Negative.  Negative for vomiting, abdominal pain, diarrhea and anorexia.  Endocrine: Negative.   Genitourinary: Negative.   Musculoskeletal: Negative.  Negative for arthralgias, back pain, gait problem, joint pain, joint swelling, myalgias, neck pain and neck stiffness.  Skin: Positive for color change and rash. Negative for nail changes, pallor and wound.  Allergic/Immunologic: Negative.   Neurological: Negative.  Negative for facial asymmetry, speech difficulty, weakness, light-headedness, numbness and headaches.  Hematological: Negative.   Negative for adenopathy. Does not bruise/bleed easily.  Psychiatric/Behavioral: Positive for sleep disturbance and dysphoric mood. Negative for suicidal ideas, hallucinations, behavioral problems, confusion, self-injury, decreased concentration and agitation. The patient is nervous/anxious. The patient is not hyperactive.        Objective:   Physical Exam  Vitals reviewed. Constitutional: He is oriented to person, place, and time. He appears well-developed and well-nourished.  Non-toxic appearance. He does not have a sickly appearance. He does not appear ill. No distress.  HENT:  Head: Normocephalic and atraumatic.    Right Ear: Hearing, tympanic membrane, external ear and ear canal normal.  Left Ear: Hearing, tympanic membrane, external ear and ear canal normal.  Nose: Mucosal edema and rhinorrhea present. No nose lacerations, sinus tenderness, nasal deformity, septal deviation or nasal septal hematoma. No epistaxis.  No foreign bodies. Right sinus exhibits maxillary sinus tenderness. Right sinus exhibits no frontal sinus tenderness. Left sinus exhibits no maxillary sinus tenderness and no frontal sinus tenderness.  Mouth/Throat: Oropharynx is clear and moist and mucous membranes are normal. Mucous membranes are not pale, not dry and not cyanotic. No oropharyngeal exudate, posterior oropharyngeal edema, posterior oropharyngeal erythema or tonsillar abscesses.  Eyes: Conjunctivae are normal. Right eye exhibits no discharge. Left eye exhibits no discharge. No scleral icterus.  Neck: Normal range of motion. Neck supple. No JVD present. No tracheal deviation present. No thyromegaly present.  Cardiovascular: Normal rate, regular rhythm, normal heart sounds and intact distal pulses.  Exam reveals no gallop and no friction rub.   No murmur heard. Pulmonary/Chest: Effort normal and breath sounds normal. No stridor. No respiratory distress. He has no wheezes. He has no rales. He exhibits no tenderness.    Abdominal: Soft. Bowel sounds are normal. He exhibits no distension and no mass. There is no tenderness. There is no rebound  and no guarding.  Musculoskeletal: Normal range of motion. He exhibits no edema and no tenderness.  Lymphadenopathy:    He has no cervical adenopathy.  Neurological: He is oriented to person, place, and time.  Skin: Skin is warm and dry. Rash noted. No abrasion, no bruising, no burn, no ecchymosis, no laceration, no lesion, no petechiae and no purpura noted. Rash is papular. Rash is not macular, not maculopapular, not nodular, not pustular, not vesicular and not urticarial. He is not diaphoretic. There is erythema. No cyanosis. No pallor. Nails show no clubbing.  Left malar surface was cleaned with betadine, then prepped and draped in sterile fashion. Local anesthesia was obtained with 2% lido with epi. Next a 4 mm punch incision was made and biopsy specimen was collected and sent to pathology. 1 simple suture was place with 6.0 nylon and there was good closure. Hemostasis was obtained and he tolerated the procedure well. Triple antibiotic ointment and a dressing were applied.   Psychiatric: He has a normal mood and affect. His behavior is normal. Judgment and thought content normal.     Lab Results  Component Value Date   WBC 9.3 12/13/2013   HGB 15.4 12/13/2013   HCT 42.4 12/13/2013   PLT 332 12/13/2013   GLUCOSE 95 12/13/2013   CHOL 167 02/20/2012   TRIG 163* 02/20/2012   HDL 43 02/20/2012   LDLCALC 91 02/20/2012   ALT 32 11/30/2013   AST 32 11/30/2013   NA 136 12/13/2013   K 4.5 12/13/2013   CL 100 12/13/2013   CREATININE 0.75 12/13/2013   BUN 14 12/13/2013   CO2 26 12/13/2013   TSH 1.493 03/27/2013       Assessment & Plan:

## 2014-02-05 NOTE — Patient Instructions (Signed)

## 2014-02-05 NOTE — Progress Notes (Signed)
Pre visit review using our clinic review tool, if applicable. No additional management support is needed unless otherwise documented below in the visit note. 

## 2014-02-07 ENCOUNTER — Telehealth: Payer: Self-pay | Admitting: Internal Medicine

## 2014-02-07 NOTE — Telephone Encounter (Signed)
Returned patient's call and he lost his instructions for his procedure.  His appointment was changed so I went over the instructions in detail with patient and he understood.  He will be here at 1:00 pm for a 2:00 pm procedure on Monday.

## 2014-02-07 NOTE — Assessment & Plan Note (Signed)
Will treat the infection with amoxil 

## 2014-02-07 NOTE — Assessment & Plan Note (Signed)
Biopsy done to better identify the cause of the rash. I am concerned that he may have a contact dermatitis from neosporin and have asked him to stop that. Will look for evidence of tinea on the biopsy specimen. For now will treat with desowen lotion.

## 2014-02-07 NOTE — Assessment & Plan Note (Signed)
Will cont truvada

## 2014-02-10 ENCOUNTER — Encounter: Payer: Self-pay | Admitting: Internal Medicine

## 2014-02-10 ENCOUNTER — Ambulatory Visit (AMBULATORY_SURGERY_CENTER): Payer: BC Managed Care – PPO | Admitting: Internal Medicine

## 2014-02-10 VITALS — BP 130/80 | HR 77 | Temp 96.9°F | Resp 17 | Ht 66.0 in | Wt 201.0 lb

## 2014-02-10 DIAGNOSIS — K6289 Other specified diseases of anus and rectum: Secondary | ICD-10-CM

## 2014-02-10 DIAGNOSIS — K5190 Ulcerative colitis, unspecified, without complications: Secondary | ICD-10-CM

## 2014-02-10 MED ORDER — SODIUM CHLORIDE 0.9 % IV SOLN: 500.0000 mL | INTRAVENOUS | Status: DC

## 2014-02-10 NOTE — Op Note (Signed)
[facility] [address] [place], [id number]   COLONOSCOPY PROCEDURE REPORT  PATIENT: Keith Walker, [name]  MR#: [id number] BIRTHDATE: Apr 21, 1986 , 28  yrs. old GENDER: male ENDOSCOPIST: Beverley FiedlerJay M Pyrtle, MD PROCEDURE DATE:  02/10/2014 PROCEDURE:   Colonoscopy with biopsy First Screening Colonoscopy - Avg.  risk and is 50 yrs.  old or older - No.  Prior Negative Screening - Now for repeat screening. N/A  History of Adenoma - Now for follow-up colonoscopy & has been > or = to 3 yrs.  N/A  Polyps Removed Today? No.  Polyps Removed Today? No.  Recommend repeat exam, <10 yrs? Polyps Removed Today? No.  Recommend repeat exam, <10 yrs? Yes.  Polyps Removed Today? No.  Recommend repeat exam, <10 yrs? Yes.  No reason given. ASA CLASS:   Class II INDICATIONS:follow up for previously diagnosed left sided ulcerative colitis. MEDICATIONS: Propofol 270 mg IV and Monitored anesthesia care  DESCRIPTION OF PROCEDURE:   After the risks benefits and alternatives of the procedure were thoroughly explained, informed consent was obtained.  The digital rectal exam revealed no rectal mass.   The LB [id number]  endoscope was introduced through the anus and advanced to the terminal ileum which was intubated for a short distance. No adverse events experienced.   The quality of the prep was excellent, using MoviPrep  The instrument was then slowly withdrawn as the colon was fully examined.      COLON FINDINGS: The examined terminal ileum appeared to be normal. The colonic mucosa appeared normal in the cecum, ascending, transverse, descending colon, and sigmoid colon.  Multiple biopsies were performed using cold forceps were taken from the right and left colon.   Mild proctitis was found in the distal rectum.  The mucosa was erythematous and had an area of ulceration. 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 Multiple biopsies of the area were performed using cold  forceps. Retroflexed views revealed no other abnormalities. The time to cecum=4 minutes 45 seconds.  Withdrawal time=9 minutes 08 seconds. The scope was withdrawn and the procedure completed.  COMPLICATIONS: There were no immediate complications.  ENDOSCOPIC IMPRESSION: 1.   The examined terminal ileum appeared to be normal 2.  The colonic mucosa appeared normal in the entire colon from cecum to proximal rectum; multiple biopsies were performed using cold forceps 3.   Distal proctitis with area of ulceration; multiple biopsies of the area were performed using cold forceps  RECOMMENDATIONS: 1.  Await biopsy results 2.  Avoid all NSAIDs 3.  Continue current medications 4.  Office follow-up in 3 months 5.  You will receive a letter within 1-2 weeks with the results of your biopsy as well as any additional recommendations.  Please call my office if you have not received a letter after 3 weeks.  eSigned:  Beverley FiedlerJay M Pyrtle, MD 02/10/2014 2:04 PM   cc: The Patient, Etta Grandchildhomas L Jones, MD, and Alben DeedsBeekman, James MD   PATIENT NAME:  Keith Walker, Mehtab MR#: [id number]

## 2014-02-10 NOTE — Progress Notes (Signed)
Called to room to assist during endoscopic procedure.  Patient ID and intended procedure confirmed with present staff. Received instructions for my participation in the procedure from the performing physician.  

## 2014-02-10 NOTE — Patient Instructions (Signed)
Discharge instructions given with verbal understanding. Biopsies taken. Avoid all NSAIDS. Resume previous medications. YOU HAD AN ENDOSCOPIC PROCEDURE TODAY AT THE [facility]: Refer to the procedure report that was given to you for any specific questions about what was found during the examination.  If the procedure report does not answer your questions, please call your gastroenterologist to clarify.  If you requested that your care partner not be given the details of your procedure findings, then the procedure report has been included in a sealed envelope for you to review at your convenience later.  YOU SHOULD EXPECT: Some feelings of bloating in the abdomen. Passage of more gas than usual.  Walking can help get rid of the air that was put into your GI tract during the procedure and reduce the bloating. If you had a lower endoscopy (such as a colonoscopy or flexible sigmoidoscopy) you may notice spotting of blood in your stool or on the toilet paper. If you underwent a bowel prep for your procedure, then you may not have a normal bowel movement for a few days.  DIET: Your first meal following the procedure should be a light meal and then it is ok to progress to your normal diet.  A half-sandwich or bowl of soup is an example of a good first meal.  Heavy or fried foods are harder to digest and may make you feel nauseous or bloated.  Likewise meals heavy in dairy and vegetables can cause extra gas to form and this can also increase the bloating.  Drink plenty of fluids but you should avoid alcoholic beverages for 24 hours.  ACTIVITY: Your care partner should take you home directly after the procedure.  You should plan to take it easy, moving slowly for the rest of the day.  You can resume normal activity the day after the procedure however you should NOT DRIVE or use heavy machinery for 24 hours (because of the sedation medicines used during the test).    SYMPTOMS TO REPORT IMMEDIATELY: A  gastroenterologist can be reached at any hour.  During normal business hours, 8:30 AM to 5:00 PM Monday through Friday, call [phone].  After hours and on weekends, please call the GI answering service at [phone] who will take a message and have the physician on call contact you.   Following lower endoscopy (colonoscopy or flexible sigmoidoscopy):  Excessive amounts of blood in the stool  Significant tenderness or worsening of abdominal pains  Swelling of the abdomen that is new, acute  Fever of 100F or higher FOLLOW UP: If any biopsies were taken you will be contacted by phone or by letter within the next 1-3 weeks.  Call your gastroenterologist if you have not heard about the biopsies in 3 weeks.  Our staff will call the home number listed on your records the next business day following your procedure to check on you and address any questions or concerns that you may have at that time regarding the information given to you following your procedure. This is a courtesy call and so if there is no answer at the home number and we have not heard from you through the emergency physician on call, we will assume that you have returned to your regular daily activities without incident.  SIGNATURES/CONFIDENTIALITY: You and/or your care partner have signed paperwork which will be entered into your electronic medical record.  These signatures attest to the fact that that the information above on your After Visit Summary has  been reviewed and is understood.  Full responsibility of the confidentiality of this discharge information lies with you and/or your care-partner.

## 2014-02-10 NOTE — Progress Notes (Signed)
Report to PACU, RN, vss, BBS= Clear.  

## 2014-02-11 ENCOUNTER — Telehealth: Payer: Self-pay

## 2014-02-11 NOTE — Telephone Encounter (Signed)
Patient has a rash on face spreading to the corner of eye and on his lips, it's a burning sensation, and get worse at night. Please advise

## 2014-02-11 NOTE — Telephone Encounter (Signed)
  Follow up Call-  Call back number 02/10/2014 05/23/2011  Post procedure Call Back phone  # [phone]  Permission to leave phone message Yes Yes     Patient questions:  Do you have a fever, pain , or abdominal swelling? No. Pain Score  0 *  Have you tolerated food without any problems? Yes.    Have you been able to return to your normal activities? Yes.    Do you have any questions about your discharge instructions: Diet   No. Medications  No. Follow up visit  No.  Do you have questions or concerns about your Care? No.  Actions: * If pain score is 4 or above: No action needed, pain <4.

## 2014-02-12 ENCOUNTER — Encounter: Payer: Self-pay | Admitting: Internal Medicine

## 2014-02-12 ENCOUNTER — Encounter: Payer: BC Managed Care – PPO | Admitting: Nurse Practitioner

## 2014-02-12 ENCOUNTER — Ambulatory Visit (INDEPENDENT_AMBULATORY_CARE_PROVIDER_SITE_OTHER): Payer: BC Managed Care – PPO | Admitting: Internal Medicine

## 2014-02-12 VITALS — BP 116/78 | HR 66 | Temp 98.1°F | Resp 16 | Ht 66.0 in | Wt 205.0 lb

## 2014-02-12 DIAGNOSIS — J019 Acute sinusitis, unspecified: Secondary | ICD-10-CM

## 2014-02-12 DIAGNOSIS — B9689 Other specified bacterial agents as the cause of diseases classified elsewhere: Secondary | ICD-10-CM

## 2014-02-12 DIAGNOSIS — B3742 Candidal balanitis: Secondary | ICD-10-CM

## 2014-02-12 DIAGNOSIS — B358 Other dermatophytoses: Secondary | ICD-10-CM

## 2014-02-12 DIAGNOSIS — B86 Scabies: Secondary | ICD-10-CM

## 2014-02-12 MED ORDER — KETOCONAZOLE 2 % EX CREA: 1.0000 "application " | TOPICAL_CREAM | Freq: Two times a day (BID) | CUTANEOUS | Status: DC

## 2014-02-12 MED ORDER — IVERMECTIN 3 MG PO TABS: 200.0000 ug/kg | ORAL_TABLET | Freq: Once | ORAL | Status: DC

## 2014-02-12 MED ORDER — MOXIFLOXACIN HCL 400 MG PO TABS: 400.0000 mg | ORAL_TABLET | Freq: Every day | ORAL | Status: DC

## 2014-02-12 NOTE — Assessment & Plan Note (Addendum)
I was initially concerned about scabies and sent in an Rx for ivermectin but then the pathologist called and she told me that there weren't a lot of eosinophils that you would expect with scabies but she will look for further, she did say that there were a few yeast and she is calling this tinea. For now, I have asked him to hold on the ivermectin. Will treat with ketoconazole and terbinafine.

## 2014-02-12 NOTE — Assessment & Plan Note (Signed)
He has not had a very good response to amoxil Will change to avelox

## 2014-02-12 NOTE — Progress Notes (Signed)
Subjective:    Patient ID: Keith Walker, male    DOB: 03/30/1986, 28 y.o.   MRN: [id number]  Rash This is a recurrent problem. The current episode started more than 1 month ago. The problem is unchanged. The affected locations include the face. The rash is characterized by scaling, redness, swelling and itchiness. He was exposed to an ill contact (scabies). Associated symptoms include congestion, coughing (still coughing up thick green phelgm) and rhinorrhea. Pertinent negatives include no anorexia, diarrhea, eye pain, facial edema, fatigue, fever, joint pain, nail changes, shortness of breath, sore throat or vomiting. Past treatments include antibiotics, topical steroids, anti-itch cream and antibiotic cream. The treatment provided no relief. There is no history of allergies, asthma, eczema or varicella.      Review of Systems  Constitutional: Negative.  Negative for fever, chills, diaphoresis, appetite change and fatigue.  HENT: Positive for congestion, rhinorrhea, sinus pressure and sneezing. Negative for sore throat, trouble swallowing and voice change.   Eyes: Negative.  Negative for pain.  Respiratory: Positive for cough (still coughing up thick green phelgm). Negative for apnea, choking, chest tightness, shortness of breath, wheezing and stridor.   Cardiovascular: Negative.  Negative for palpitations and leg swelling.  Gastrointestinal: Negative.  Negative for vomiting, abdominal pain, diarrhea, constipation, blood in stool and anorexia.  Endocrine: Negative.   Genitourinary: Negative.  Negative for dysuria, urgency, frequency, hematuria, flank pain, decreased urine volume, discharge, penile swelling, scrotal swelling, enuresis, difficulty urinating, genital sores, penile pain and testicular pain.       He complains that there is an exudate under his foreskin  Musculoskeletal: Positive for arthralgias. Negative for joint pain.  Skin: Positive for rash. Negative for nail changes,  color change, pallor and wound.  Allergic/Immunologic: Negative.   Neurological: Negative.   Hematological: Negative.  Negative for adenopathy. Does not bruise/bleed easily.  Psychiatric/Behavioral: Negative.        Objective:   Physical Exam  Vitals reviewed. Constitutional: He is oriented to person, place, and time. He appears well-developed and well-nourished.  Non-toxic appearance. He does not have a sickly appearance. He does not appear ill. No distress.  HENT:  Head: Normocephalic and atraumatic.    Mouth/Throat: Oropharynx is clear and moist. No oropharyngeal exudate.  Eyes: Conjunctivae are normal. Right eye exhibits no discharge. Left eye exhibits no discharge. No scleral icterus.  Neck: Normal range of motion. Neck supple. No JVD present. No tracheal deviation present. No thyromegaly present.  Cardiovascular: Normal rate, regular rhythm, normal heart sounds and intact distal pulses.  Exam reveals no gallop and no friction rub.   No murmur heard. Pulmonary/Chest: Effort normal and breath sounds normal. No stridor. No respiratory distress. He has no wheezes. He has no rales. He exhibits no tenderness.  Abdominal: Soft. Bowel sounds are normal. He exhibits no distension and no mass. There is no tenderness. There is no rebound and no guarding.  Musculoskeletal: Normal range of motion. He exhibits no edema and no tenderness.  Lymphadenopathy:    He has no cervical adenopathy.  Neurological: He is oriented to person, place, and time.  Skin: Skin is warm and dry. Rash noted. He is not diaphoretic. No erythema. No pallor.  Psychiatric: He has a normal mood and affect. His behavior is normal. Judgment and thought content normal.    Lab Results  Component Value Date   WBC 9.3 12/13/2013   HGB 15.4 12/13/2013   HCT 42.4 12/13/2013   PLT 332 12/13/2013   GLUCOSE 95 12/13/2013  CHOL 167 02/20/2012   TRIG 163* 02/20/2012   HDL 43 02/20/2012   LDLCALC 91 02/20/2012   ALT 32 11/30/2013     AST 32 11/30/2013   NA 136 12/13/2013   K 4.5 12/13/2013   CL 100 12/13/2013   CREATININE 0.75 12/13/2013   BUN 14 12/13/2013   CO2 26 12/13/2013   TSH 1.493 03/27/2013        Assessment & Plan:

## 2014-02-12 NOTE — Patient Instructions (Signed)

## 2014-02-12 NOTE — Progress Notes (Signed)
Pre visit review using our clinic review tool, if applicable. No additional management support is needed unless otherwise documented below in the visit note. 

## 2014-02-12 NOTE — Assessment & Plan Note (Signed)
Will treat with ketoconazole 

## 2014-02-13 ENCOUNTER — Telehealth: Payer: Self-pay | Admitting: Internal Medicine

## 2014-02-13 MED ORDER — TERBINAFINE HCL 250 MG PO TABS: 250.0000 mg | ORAL_TABLET | Freq: Every day | ORAL | Status: DC

## 2014-02-13 NOTE — Addendum Note (Signed)
Addended by: Etta GrandchildJONES, Duanne Duchesne L on: 02/13/2014 10:23 AM   Modules accepted: Orders

## 2014-02-13 NOTE — Telephone Encounter (Signed)
emmi emailed °

## 2014-02-14 ENCOUNTER — Encounter: Payer: Self-pay | Admitting: Internal Medicine

## 2014-02-18 ENCOUNTER — Encounter: Payer: Self-pay | Admitting: Internal Medicine

## 2014-03-24 ENCOUNTER — Encounter: Payer: Self-pay | Admitting: Internal Medicine

## 2014-04-02 ENCOUNTER — Encounter: Payer: BC Managed Care – PPO | Admitting: Physician Assistant

## 2014-04-04 ENCOUNTER — Telehealth: Payer: Self-pay | Admitting: *Deleted

## 2014-04-04 NOTE — Telephone Encounter (Signed)
Patient has cancelled the last 3 GXT's

## 2014-04-04 NOTE — Telephone Encounter (Signed)
-----   [facility] sent at 01/24/2014  9:52 AM EDT ----- Regarding: treadmill Treadmill scheduled or 02/12/2014  BLUE CROSS BLUE SHIELD/[employer] [place] PPO

## 2014-05-13 ENCOUNTER — Other Ambulatory Visit: Payer: Self-pay | Admitting: Physician Assistant

## 2014-05-13 ENCOUNTER — Encounter: Payer: Self-pay | Admitting: Internal Medicine

## 2014-05-14 ENCOUNTER — Other Ambulatory Visit: Payer: Self-pay | Admitting: Internal Medicine

## 2014-05-14 DIAGNOSIS — B3742 Candidal balanitis: Secondary | ICD-10-CM

## 2014-05-14 MED ORDER — ALPRAZOLAM 1 MG PO TABS: 1.0000 mg | ORAL_TABLET | Freq: Three times a day (TID) | ORAL | Status: DC | PRN

## 2014-05-14 MED ORDER — KETOCONAZOLE 2 % EX CREA: 1.0000 "application " | TOPICAL_CREAM | Freq: Two times a day (BID) | CUTANEOUS | Status: DC

## 2014-05-15 ENCOUNTER — Other Ambulatory Visit: Payer: Self-pay | Admitting: Internal Medicine

## 2014-05-21 ENCOUNTER — Other Ambulatory Visit: Payer: Self-pay | Admitting: Internal Medicine

## 2014-05-21 ENCOUNTER — Encounter: Payer: Self-pay | Admitting: Internal Medicine

## 2014-05-21 DIAGNOSIS — Z299 Encounter for prophylactic measures, unspecified: Secondary | ICD-10-CM

## 2014-05-21 MED ORDER — EMTRICITABINE-TENOFOVIR DF 200-300 MG PO TABS: 1.0000 | ORAL_TABLET | Freq: Every day | ORAL | Status: DC

## 2014-06-12 ENCOUNTER — Other Ambulatory Visit (INDEPENDENT_AMBULATORY_CARE_PROVIDER_SITE_OTHER): Payer: Managed Care, Other (non HMO)

## 2014-06-12 ENCOUNTER — Encounter: Payer: Self-pay | Admitting: Internal Medicine

## 2014-06-12 ENCOUNTER — Ambulatory Visit (INDEPENDENT_AMBULATORY_CARE_PROVIDER_SITE_OTHER): Payer: Managed Care, Other (non HMO) | Admitting: Internal Medicine

## 2014-06-12 VITALS — BP 118/74 | HR 80 | Temp 98.2°F | Resp 16 | Ht 66.0 in | Wt 210.0 lb

## 2014-06-12 DIAGNOSIS — B9689 Other specified bacterial agents as the cause of diseases classified elsewhere: Secondary | ICD-10-CM

## 2014-06-12 DIAGNOSIS — R5382 Chronic fatigue, unspecified: Secondary | ICD-10-CM

## 2014-06-12 DIAGNOSIS — F418 Other specified anxiety disorders: Secondary | ICD-10-CM

## 2014-06-12 DIAGNOSIS — F900 Attention-deficit hyperactivity disorder, predominantly inattentive type: Secondary | ICD-10-CM

## 2014-06-12 DIAGNOSIS — J019 Acute sinusitis, unspecified: Secondary | ICD-10-CM

## 2014-06-12 LAB — COMPREHENSIVE METABOLIC PANEL
ALT: 24 U/L (ref 0–53)
AST: 22 U/L (ref 0–37)
Albumin: 4.1 g/dL (ref 3.5–5.2)
Alkaline Phosphatase: 63 U/L (ref 39–117)
BUN: 16 mg/dL (ref 6–23)
CHLORIDE: 103 meq/L (ref 96–112)
CO2: 29 meq/L (ref 19–32)
CREATININE: 0.9 mg/dL (ref 0.40–1.50)
Calcium: 9.3 mg/dL (ref 8.4–10.5)
GFR: 107.68 mL/min (ref >60.00)
Glucose, Bld: 76 mg/dL (ref 70–99)
Potassium: 4.1 mEq/L (ref 3.5–5.1)
Sodium: 137 mEq/L (ref 135–145)
Total Bilirubin: 0.5 mg/dL (ref 0.2–1.2)
Total Protein: 7.3 g/dL (ref 6.0–8.3)

## 2014-06-12 LAB — CBC WITH DIFFERENTIAL/PLATELET
BASOS ABS: 0 10*3/uL (ref 0.0–0.1)
Basophils Relative: 0.2 % (ref 0.0–3.0)
Eosinophils Absolute: 0.2 10*3/uL (ref 0.0–0.7)
Eosinophils Relative: 2.4 % (ref 0.0–5.0)
HCT: 45 % (ref 39.0–52.0)
Hemoglobin: 15.7 g/dL (ref 13.0–17.0)
LYMPHS ABS: 3.1 10*3/uL (ref 0.7–4.0)
Lymphocytes Relative: 42.1 % (ref 12.0–46.0)
MCHC: 34.8 g/dL (ref 30.0–36.0)
MCV: 93.9 fl (ref 78.0–100.0)
Monocytes Absolute: 0.8 10*3/uL (ref 0.1–1.0)
Monocytes Relative: 10.1 % (ref 3.0–12.0)
Neutro Abs: 3.4 10*3/uL (ref 1.4–7.7)
Neutrophils Relative %: 45.2 % (ref 43.0–77.0)
PLATELETS: 208 10*3/uL (ref 150.0–400.0)
RBC: 4.79 Mil/uL (ref 4.22–5.81)
RDW: 13.1 % (ref 11.5–15.5)
WBC: 7.5 10*3/uL (ref 4.0–10.5)

## 2014-06-12 LAB — T3, FREE: T3, Free: 3.4 pg/mL (ref 2.3–4.2)

## 2014-06-12 LAB — TSH: TSH: 1.5 u[IU]/mL (ref 0.35–4.50)

## 2014-06-12 MED ORDER — MOXIFLOXACIN HCL 400 MG PO TABS: 400.0000 mg | ORAL_TABLET | Freq: Every day | ORAL | Status: DC

## 2014-06-12 MED ORDER — TRAZODONE HCL 100 MG PO TABS
100.0000 mg | ORAL_TABLET | Freq: Every day | ORAL | Status: DC
Start: 2014-06-12 — End: 2014-08-25

## 2014-06-12 MED ORDER — ATOMOXETINE HCL 25 MG PO CAPS: 25.0000 mg | ORAL_CAPSULE | Freq: Every day | ORAL | Status: DC

## 2014-06-12 MED ORDER — DULOXETINE HCL 30 MG PO CPEP: 30.0000 mg | ORAL_CAPSULE | Freq: Every day | ORAL | Status: DC

## 2014-06-12 NOTE — Progress Notes (Signed)
Pre visit review using our clinic review tool, if applicable. No additional management support is needed unless otherwise documented below in the visit note. 

## 2014-06-12 NOTE — Progress Notes (Signed)
   Subjective:    Patient ID: Keith LeventhalNickolas Walker, male    DOB: 01/05/1986, 29 y.o.   MRN: [id number]  Sinusitis      Review of Systems     Objective:   Physical Exam   Lab Results  Component Value Date   WBC 9.3 12/13/2013   HGB 15.4 12/13/2013   HCT 42.4 12/13/2013   PLT 332 12/13/2013   GLUCOSE 95 12/13/2013   CHOL 167 02/20/2012   TRIG 163* 02/20/2012   HDL 43 02/20/2012   LDLCALC 91 02/20/2012   ALT 32 11/30/2013   AST 32 11/30/2013   NA 136 12/13/2013   K 4.5 12/13/2013   CL 100 12/13/2013   CREATININE 0.75 12/13/2013   BUN 14 12/13/2013   CO2 26 12/13/2013   TSH 1.493 03/27/2013       Assessment & Plan:

## 2014-06-12 NOTE — Patient Instructions (Signed)

## 2014-06-13 ENCOUNTER — Encounter: Payer: Self-pay | Admitting: Internal Medicine

## 2014-06-13 LAB — TESTOSTERONE, FREE, TOTAL, SHBG
Sex Hormone Binding: 14 nmol/L (ref 10–50)
TESTOSTERONE FREE: 67.5 pg/mL (ref 47.0–244.0)
TESTOSTERONE-% FREE: 2.9 % (ref 1.6–2.9)
TESTOSTERONE: 235 ng/dL — AB (ref 300–890)

## 2014-06-13 LAB — T4: T4 TOTAL: 6.2 ug/dL (ref 4.5–12.0)

## 2014-06-13 NOTE — Assessment & Plan Note (Signed)
I think cymbalta may be too stimulating for him so I have asked him to start lowering the dose from 60 mg per day to 30 mg per day, may eventually d;c this I think trazodone would be a good option for his mood disorder Will start with 100 mg qHS and I anticipate will increase the dose over time

## 2014-06-13 NOTE — Assessment & Plan Note (Signed)
Will treat with avelox 

## 2014-06-13 NOTE — Assessment & Plan Note (Signed)
Labs ordered and reviewed This appears to be related to low T He will let me know if he wants to treat this

## 2014-06-13 NOTE — Progress Notes (Signed)
Subjective:    Patient ID: Keith Walker, male    DOB: Oct 09, 1985, 29 y.o.   MRN: [id number]  Sinusitis This is a recurrent problem. The current episode started 1 to 4 weeks ago. The problem has been gradually worsening since onset. He is experiencing no pain. Associated symptoms include chills, congestion, sinus pressure and a sore throat. Pertinent negatives include no coughing, diaphoresis, ear pain, headaches, hoarse voice, neck pain, shortness of breath, sneezing or swollen glands. Past treatments include spray decongestants. The treatment provided mild relief.      Review of Systems  Constitutional: Positive for chills and fatigue. Negative for fever, diaphoresis, activity change, appetite change and unexpected weight change.  HENT: Positive for congestion, sinus pressure and sore throat. Negative for ear pain, hoarse voice, sneezing, trouble swallowing and voice change.   Eyes: Negative.   Respiratory: Negative.  Negative for cough, choking, chest tightness, shortness of breath and stridor.   Cardiovascular: Negative.  Negative for chest pain, palpitations and leg swelling.  Gastrointestinal: Negative.  Negative for vomiting, abdominal pain, diarrhea, constipation and blood in stool.  Endocrine: Negative.   Genitourinary: Negative.        He complains of low libido and ED.  Musculoskeletal: Positive for myalgias. Negative for neck pain.  Skin: Negative.  Negative for rash.  Allergic/Immunologic: Negative.   Neurological: Negative.  Negative for headaches.  Hematological: Negative.  Negative for adenopathy. Does not bruise/bleed easily.  Psychiatric/Behavioral: Positive for sleep disturbance, dysphoric mood, decreased concentration and agitation. Negative for suicidal ideas, hallucinations, behavioral problems, confusion and self-injury. The patient is nervous/anxious and is hyperactive.        He tells me that he is not doing well at work due to a lack of focus and attention  to detail, he was previously treated with adderall for ADHD but it caused "rage." He also complains that his mood is not good (anxious, irritable, angry, disturbed sleep.) He has been taking cymbalta for FMG but it has not helped that much and has not helped his mood.       Objective:   Physical Exam  Constitutional: He is oriented to person, place, and time. He appears well-developed and well-nourished.  Non-toxic appearance. He does not have a sickly appearance. He does not appear ill. No distress.  HENT:  Head: Normocephalic and atraumatic.  Right Ear: Hearing, tympanic membrane, external ear and ear canal normal.  Left Ear: Hearing, tympanic membrane, external ear and ear canal normal.  Nose: Mucosal edema and rhinorrhea present. Right sinus exhibits maxillary sinus tenderness. Right sinus exhibits no frontal sinus tenderness. Left sinus exhibits no maxillary sinus tenderness.  Mouth/Throat: Oropharynx is clear and moist and mucous membranes are normal. Mucous membranes are not pale, not dry and not cyanotic. No oral lesions. No trismus in the jaw. No uvula swelling. No oropharyngeal exudate, posterior oropharyngeal edema, posterior oropharyngeal erythema or tonsillar abscesses.  Eyes: Conjunctivae are normal. Right eye exhibits no discharge. Left eye exhibits no discharge. No scleral icterus.  Neck: Normal range of motion. Neck supple. No JVD present. No tracheal deviation present. No thyromegaly present.  Cardiovascular: Normal rate, regular rhythm, normal heart sounds and intact distal pulses.  Exam reveals no gallop and no friction rub.   No murmur heard. Pulmonary/Chest: Effort normal and breath sounds normal. No stridor. No respiratory distress. He has no wheezes. He has no rales. He exhibits no tenderness.  Abdominal: Soft. Bowel sounds are normal. He exhibits no distension and no mass. There is  no tenderness. There is no rebound and no guarding.  Musculoskeletal: Normal range of  motion. He exhibits no edema or tenderness.  Lymphadenopathy:    He has no cervical adenopathy.  Neurological: He is oriented to person, place, and time.  Skin: Skin is warm and dry. No rash noted. He is not diaphoretic. No erythema. No pallor.  Psychiatric: He has a normal mood and affect. His behavior is normal. Judgment and thought content normal.  Vitals reviewed.    Lab Results  Component Value Date   WBC 7.5 06/12/2014   HGB 15.7 06/12/2014   HCT 45.0 06/12/2014   PLT 208.0 06/12/2014   GLUCOSE 76 06/12/2014   CHOL 167 02/20/2012   TRIG 163* 02/20/2012   HDL 43 02/20/2012   LDLCALC 91 02/20/2012   ALT 24 06/12/2014   AST 22 06/12/2014   NA 137 06/12/2014   K 4.1 06/12/2014   CL 103 06/12/2014   CREATININE 0.90 06/12/2014   BUN 16 06/12/2014   CO2 29 06/12/2014   TSH 1.50 06/12/2014       Assessment & Plan:

## 2014-06-13 NOTE — Assessment & Plan Note (Signed)
Will start strattera - he was given the sample starter pak and will let me know of his response

## 2014-06-16 ENCOUNTER — Other Ambulatory Visit: Payer: Self-pay | Admitting: Internal Medicine

## 2014-06-16 ENCOUNTER — Other Ambulatory Visit (INDEPENDENT_AMBULATORY_CARE_PROVIDER_SITE_OTHER): Payer: Managed Care, Other (non HMO)

## 2014-06-16 DIAGNOSIS — E291 Testicular hypofunction: Secondary | ICD-10-CM

## 2014-06-16 LAB — FOLLICLE STIMULATING HORMONE: FSH: 2.2 m[IU]/mL (ref 1.4–18.1)

## 2014-06-16 LAB — LUTEINIZING HORMONE: LH: 3.7 m[IU]/mL (ref 1.50–9.30)

## 2014-06-17 ENCOUNTER — Other Ambulatory Visit: Payer: Self-pay | Admitting: Internal Medicine

## 2014-06-17 ENCOUNTER — Encounter: Payer: Self-pay | Admitting: Internal Medicine

## 2014-06-17 DIAGNOSIS — E291 Testicular hypofunction: Secondary | ICD-10-CM

## 2014-06-17 LAB — TESTOSTERONE, FREE, TOTAL, SHBG
Sex Hormone Binding: 15 nmol/L (ref 10–50)
Testosterone, Free: 89.1 pg/mL (ref 47.0–244.0)
Testosterone-% Free: 2.9 % (ref 1.6–2.9)
Testosterone: 311 ng/dL (ref 300–890)

## 2014-06-17 LAB — PROLACTIN: PROLACTIN: 5.9 ng/mL (ref 2.1–17.1)

## 2014-06-27 ENCOUNTER — Encounter: Payer: Self-pay | Admitting: Endocrinology

## 2014-06-27 ENCOUNTER — Ambulatory Visit (INDEPENDENT_AMBULATORY_CARE_PROVIDER_SITE_OTHER): Payer: Managed Care, Other (non HMO) | Admitting: Endocrinology

## 2014-06-27 VITALS — BP 128/90 | HR 113 | Temp 98.2°F | Ht 66.0 in | Wt 221.0 lb

## 2014-06-27 DIAGNOSIS — R200 Anesthesia of skin: Secondary | ICD-10-CM

## 2014-06-27 LAB — VITAMIN B12: Vitamin B-12: 744 pg/mL (ref 211–911)

## 2014-06-27 MED ORDER — VARDENAFIL HCL 10 MG PO TABS: 10.0000 mg | ORAL_TABLET | Freq: Every day | ORAL | Status: DC | PRN

## 2014-06-27 NOTE — Progress Notes (Signed)
   Subjective:    Patient ID: Keith Walker, male    DOB: 01/29/1986, 29 y.o.   MRN: [id number]  HPI Pt reports he had puberty at the normal age.  He has no biological children.  He says he has never taken illicit androgens.  He has never been on any prescribed medication for hypogonadism.  He denies any h/o infertility.  He has never had surgery, or a serious injury to the head or genital area.  He has few years of moderate loss of hair on the head, and assoc fatigue.  He also reports mood swings and being prone to anger.      Review of Systems denies depression, decreased urinary stream, gynecomastia, muscle weakness, fever, headache, easy bruising, sob, rash, blurry vision, chest pain.  He has rhinorrhea, acral numbness, ED, and weight gain.  Depression is well-controlled.      Objective:   Physical Exam VS: see vs page GEN: no distress HEAD: head: no deformity eyes: no periorbital swelling, no proptosis external nose and ears are normal mouth: no lesion seen NECK: supple, thyroid is not enlarged CHEST WALL: no deformity BREASTS:  No gynecomastia GENITALIA:  Normal male.   MUSCULOSKELETAL: muscle bulk and strength are grossly normal.  no obvious joint swelling.  gait is normal and steady EXTEMITIES: no deformity.  no edema.  NEURO:  cn 2-12 grossly intact.   readily moves all 4's.  sensation is intact to touch on all 4's. SKIN:  Normal texture and temperature.  No rash or suspicious lesion is visible.  Normal hair distribution.  No vitiligo.   NODES:  None palpable at the neck PSYCH: alert, well-oriented.  Does not appear anxious nor depressed.   Lab Results  Component Value Date   TESTOSTERONE 311 06/16/2014   B-12=normal    Assessment & Plan:  Low total testosterone, prob due to obesity.  Free is normal, so no rx is needed. 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  We'll let you know about the results. i have sent a prescription to your pharmacy, for the ED symptoms. Weight loss helps raise the testosterone.  However, your free testosterone is good, so no medication is needed for this.

## 2014-06-27 NOTE — Patient Instructions (Addendum)
blood tests are being requested for you today.  We'll let you know about the results. i have sent a prescription to your pharmacy, for the ED symptoms. Weight loss helps raise the testosterone.  However, your free testosterone is good, so no medication is needed for this.

## 2014-07-07 ENCOUNTER — Encounter: Payer: Self-pay | Admitting: Internal Medicine

## 2014-07-07 ENCOUNTER — Other Ambulatory Visit: Payer: Self-pay | Admitting: Internal Medicine

## 2014-07-07 DIAGNOSIS — E291 Testicular hypofunction: Secondary | ICD-10-CM

## 2014-07-08 ENCOUNTER — Other Ambulatory Visit: Payer: Self-pay | Admitting: Internal Medicine

## 2014-07-08 MED ORDER — VARENICLINE TARTRATE 0.5 MG X 11 & 1 MG X 42 PO MISC: ORAL | Status: DC

## 2014-07-08 MED ORDER — VARENICLINE TARTRATE 1 MG PO TABS: 1.0000 mg | ORAL_TABLET | Freq: Two times a day (BID) | ORAL | Status: DC

## 2014-07-14 ENCOUNTER — Encounter: Payer: Self-pay | Admitting: Internal Medicine

## 2014-07-15 ENCOUNTER — Other Ambulatory Visit: Payer: Self-pay | Admitting: Internal Medicine

## 2014-07-15 DIAGNOSIS — F900 Attention-deficit hyperactivity disorder, predominantly inattentive type: Secondary | ICD-10-CM

## 2014-07-15 MED ORDER — ATOMOXETINE HCL 80 MG PO CAPS: 80.0000 mg | ORAL_CAPSULE | Freq: Every day | ORAL | Status: DC

## 2014-07-24 ENCOUNTER — Other Ambulatory Visit: Payer: Self-pay | Admitting: Internal Medicine

## 2014-07-25 ENCOUNTER — Encounter: Payer: Self-pay | Admitting: Internal Medicine

## 2014-07-25 ENCOUNTER — Other Ambulatory Visit: Payer: Self-pay | Admitting: Internal Medicine

## 2014-07-25 ENCOUNTER — Ambulatory Visit (INDEPENDENT_AMBULATORY_CARE_PROVIDER_SITE_OTHER): Payer: Managed Care, Other (non HMO) | Admitting: Internal Medicine

## 2014-07-25 VITALS — BP 130/80 | HR 105 | Wt 221.0 lb

## 2014-07-25 DIAGNOSIS — K610 Anal abscess: Secondary | ICD-10-CM | POA: Diagnosis not present

## 2014-07-25 MED ORDER — OXYCODONE-ACETAMINOPHEN 7.5-325 MG PO TABS: 1.0000 | ORAL_TABLET | ORAL | Status: DC | PRN

## 2014-07-25 NOTE — Progress Notes (Signed)
Pre visit review using our clinic review tool, if applicable. No additional management support is needed unless otherwise documented below in the visit note. 

## 2014-07-27 ENCOUNTER — Encounter: Payer: Self-pay | Admitting: Internal Medicine

## 2014-07-27 NOTE — Assessment & Plan Note (Signed)
He will see general surgery today to consider an incision and drainage For now, will use percocet for pain

## 2014-07-27 NOTE — Progress Notes (Signed)
   Subjective:    Patient ID: Keith Walker, male    DOB: 01/30/1986, 29 y.o.   MRN: [id number]  HPI Comments: He complains of a 1 week history of a red, painful, swollen area around his left buttocks area.     Review of Systems  Constitutional: Negative.  Negative for fever, chills, diaphoresis, appetite change and fatigue.  HENT: Negative.   Eyes: Negative.   Respiratory: Negative.  Negative for cough, choking, chest tightness, shortness of breath and stridor.   Cardiovascular: Negative.  Negative for chest pain, palpitations and leg swelling.  Gastrointestinal: Positive for rectal pain. Negative for nausea, vomiting, abdominal pain, diarrhea, constipation, blood in stool and anal bleeding.  Endocrine: Negative.   Genitourinary: Negative.   Musculoskeletal: Negative.   Skin: Negative.  Negative for rash.  Allergic/Immunologic: Negative.   Neurological: Negative.   Hematological: Negative.  Negative for adenopathy.  Psychiatric/Behavioral: Negative.        Objective:   Physical Exam  Constitutional: He is oriented to person, place, and time. He appears well-developed and well-nourished.  Non-toxic appearance. He does not have a sickly appearance. He does not appear ill. No distress.  HENT:  Head: Normocephalic and atraumatic.  Mouth/Throat: Oropharynx is clear and moist. No oropharyngeal exudate.  Eyes: Conjunctivae are normal. Right eye exhibits no discharge. Left eye exhibits no discharge. No scleral icterus.  Neck: Normal range of motion. Neck supple. No JVD present. No tracheal deviation present. No thyromegaly present.  Cardiovascular: Normal rate, regular rhythm, normal heart sounds and intact distal pulses.  Exam reveals no gallop and no friction rub.   No murmur heard. Pulmonary/Chest: Effort normal and breath sounds normal. No stridor. No respiratory distress. He has no wheezes. He has no rales. He exhibits no tenderness.  Abdominal: Soft. Bowel sounds are normal.  He exhibits no distension and no mass. There is no tenderness. There is no rebound and no guarding.  Genitourinary: Prostate normal. Rectal exam shows no external hemorrhoid, no internal hemorrhoid, no fissure, no mass, no tenderness and anal tone normal. Guaiac negative stool. Prostate is not enlarged and not tender.  Over the posterior midline area there is a 3 cm raised area with erythema, tenderness, swelling, and fluctuance.  Musculoskeletal: Normal range of motion. He exhibits no edema or tenderness.  Lymphadenopathy:    He has no cervical adenopathy.  Neurological: He is oriented to person, place, and time.  Skin: Skin is warm and dry. No rash noted. He is not diaphoretic. No erythema. No pallor.  Vitals reviewed.         Assessment & Plan:

## 2014-07-29 ENCOUNTER — Telehealth: Payer: Self-pay | Admitting: Internal Medicine

## 2014-07-29 MED ORDER — MESALAMINE 1.2 G PO TBEC: 4.8000 g | DELAYED_RELEASE_TABLET | Freq: Every day | ORAL | Status: DC

## 2014-07-29 NOTE — Telephone Encounter (Signed)
Patient appears to have lowered dosage of Lialda from 4.8 g to 2.4 g on his own. I have asked that he take rx as prescribed. He has been given refill until his office visit 09/05/14.

## 2014-08-23 ENCOUNTER — Other Ambulatory Visit: Payer: Self-pay | Admitting: Internal Medicine

## 2014-08-25 ENCOUNTER — Other Ambulatory Visit: Payer: Self-pay | Admitting: Internal Medicine

## 2014-08-25 ENCOUNTER — Encounter: Payer: Self-pay | Admitting: Internal Medicine

## 2014-08-25 DIAGNOSIS — E291 Testicular hypofunction: Secondary | ICD-10-CM

## 2014-08-25 DIAGNOSIS — F418 Other specified anxiety disorders: Secondary | ICD-10-CM

## 2014-08-25 MED ORDER — TESTOSTERONE 20.25 MG/ACT (1.62%) TD GEL: 2.0000 | Freq: Every day | TRANSDERMAL | Status: DC

## 2014-08-25 MED ORDER — TRAZODONE HCL 100 MG PO TABS: 300.0000 mg | ORAL_TABLET | Freq: Every day | ORAL | Status: DC

## 2014-08-26 ENCOUNTER — Encounter: Payer: Self-pay | Admitting: *Deleted

## 2014-09-03 ENCOUNTER — Encounter: Payer: Self-pay | Admitting: Internal Medicine

## 2014-09-10 ENCOUNTER — Encounter: Payer: Self-pay | Admitting: Internal Medicine

## 2014-09-10 ENCOUNTER — Ambulatory Visit (INDEPENDENT_AMBULATORY_CARE_PROVIDER_SITE_OTHER): Payer: Managed Care, Other (non HMO) | Admitting: Internal Medicine

## 2014-09-10 ENCOUNTER — Other Ambulatory Visit (INDEPENDENT_AMBULATORY_CARE_PROVIDER_SITE_OTHER): Payer: Managed Care, Other (non HMO)

## 2014-09-10 VITALS — BP 110/82 | HR 94 | Temp 98.6°F | Resp 16 | Ht 66.0 in | Wt 219.0 lb

## 2014-09-10 DIAGNOSIS — L270 Generalized skin eruption due to drugs and medicaments taken internally: Secondary | ICD-10-CM | POA: Insufficient documentation

## 2014-09-10 DIAGNOSIS — R21 Rash and other nonspecific skin eruption: Secondary | ICD-10-CM

## 2014-09-10 DIAGNOSIS — R5382 Chronic fatigue, unspecified: Secondary | ICD-10-CM

## 2014-09-10 DIAGNOSIS — E291 Testicular hypofunction: Secondary | ICD-10-CM

## 2014-09-10 DIAGNOSIS — Z299 Encounter for prophylactic measures, unspecified: Secondary | ICD-10-CM

## 2014-09-10 DIAGNOSIS — L301 Dyshidrosis [pompholyx]: Secondary | ICD-10-CM | POA: Insufficient documentation

## 2014-09-10 DIAGNOSIS — Z418 Encounter for other procedures for purposes other than remedying health state: Secondary | ICD-10-CM

## 2014-09-10 DIAGNOSIS — M4690 Unspecified inflammatory spondylopathy, site unspecified: Secondary | ICD-10-CM | POA: Diagnosis not present

## 2014-09-10 DIAGNOSIS — K51913 Ulcerative colitis, unspecified with fistula: Secondary | ICD-10-CM | POA: Diagnosis not present

## 2014-09-10 DIAGNOSIS — T3795XA Adverse effect of unspecified systemic anti-infective and antiparasitic, initial encounter: Secondary | ICD-10-CM

## 2014-09-10 LAB — URINALYSIS, ROUTINE W REFLEX MICROSCOPIC
Bilirubin Urine: NEGATIVE
HGB URINE DIPSTICK: NEGATIVE
KETONES UR: NEGATIVE
Leukocytes, UA: NEGATIVE
Nitrite: NEGATIVE
Specific Gravity, Urine: 1.01 (ref 1.000–1.030)
Total Protein, Urine: NEGATIVE
UROBILINOGEN UA: 0.2 (ref 0.0–1.0)
Urine Glucose: NEGATIVE
WBC, UA: NONE SEEN (ref 0–?)
pH: 5.5 (ref 5.0–8.0)

## 2014-09-10 LAB — CBC WITH DIFFERENTIAL/PLATELET
BASOS ABS: 0 10*3/uL (ref 0.0–0.1)
BASOS PCT: 0.4 % (ref 0.0–3.0)
EOS ABS: 0.1 10*3/uL (ref 0.0–0.7)
Eosinophils Relative: 1.7 % (ref 0.0–5.0)
HCT: 46.5 % (ref 39.0–52.0)
Hemoglobin: 16.4 g/dL (ref 13.0–17.0)
LYMPHS PCT: 35.9 % (ref 12.0–46.0)
Lymphs Abs: 2.2 10*3/uL (ref 0.7–4.0)
MCHC: 35.3 g/dL (ref 30.0–36.0)
MCV: 92.9 fl (ref 78.0–100.0)
Monocytes Absolute: 0.6 10*3/uL (ref 0.1–1.0)
Monocytes Relative: 9.3 % (ref 3.0–12.0)
NEUTROS PCT: 52.7 % (ref 43.0–77.0)
Neutro Abs: 3.2 10*3/uL (ref 1.4–7.7)
PLATELETS: 240 10*3/uL (ref 150.0–400.0)
RBC: 5.01 Mil/uL (ref 4.22–5.81)
RDW: 12.9 % (ref 11.5–15.5)
WBC: 6.1 10*3/uL (ref 4.0–10.5)

## 2014-09-10 LAB — COMPREHENSIVE METABOLIC PANEL
ALBUMIN: 4.5 g/dL (ref 3.5–5.2)
ALK PHOS: 53 U/L (ref 39–117)
ALT: 20 U/L (ref 0–53)
AST: 20 U/L (ref 0–37)
BILIRUBIN TOTAL: 0.7 mg/dL (ref 0.2–1.2)
BUN: 12 mg/dL (ref 6–23)
CO2: 30 mEq/L (ref 19–32)
CREATININE: 0.92 mg/dL (ref 0.40–1.50)
Calcium: 9.6 mg/dL (ref 8.4–10.5)
Chloride: 100 mEq/L (ref 96–112)
GFR: 104.8 mL/min (ref >60.00)
Glucose, Bld: 82 mg/dL (ref 70–99)
POTASSIUM: 3.9 meq/L (ref 3.5–5.1)
Sodium: 135 mEq/L (ref 135–145)
Total Protein: 7.7 g/dL (ref 6.0–8.3)

## 2014-09-10 MED ORDER — TRIAMCINOLONE ACETONIDE 0.5 % EX CREA: 1.0000 "application " | TOPICAL_CREAM | Freq: Three times a day (TID) | CUTANEOUS | Status: DC

## 2014-09-10 NOTE — Patient Instructions (Signed)
Eczema Eczema, also called atopic dermatitis, is a skin disorder that causes inflammation of the skin. It causes a red rash and dry, scaly skin. The skin becomes very itchy. Eczema is generally worse during the cooler winter months and often improves with the warmth of summer. Eczema usually starts showing signs in infancy. Some children outgrow eczema, but it may last through adulthood.  CAUSES  The exact cause of eczema is not known, but it appears to run in families. People with eczema often have a family history of eczema, allergies, asthma, or hay fever. Eczema is not contagious. Flare-ups of the condition may be caused by:   Contact with something you are sensitive or allergic to.   Stress. SIGNS AND SYMPTOMS  Dry, scaly skin.   Red, itchy rash.   Itchiness. This may occur before the skin rash and may be very intense.  DIAGNOSIS  The diagnosis of eczema is usually made based on symptoms and medical history. TREATMENT  Eczema cannot be cured, but symptoms usually can be controlled with treatment and other strategies. A treatment plan might include:  Controlling the itching and scratching.   Use over-the-counter antihistamines as directed for itching. This is especially useful at night when the itching tends to be worse.   Use over-the-counter steroid creams as directed for itching.   Avoid scratching. Scratching makes the rash and itching worse. It may also result in a skin infection (impetigo) due to a break in the skin caused by scratching.   Keeping the skin well moisturized with creams every day. This will seal in moisture and help prevent dryness. Lotions that contain alcohol and water should be avoided because they can dry the skin.   Limiting exposure to things that you are sensitive or allergic to (allergens).   Recognizing situations that cause stress.   Developing a plan to manage stress.  HOME CARE INSTRUCTIONS   Only take over-the-counter or  prescription medicines as directed by your health care provider.   Do not use anything on the skin without checking with your health care provider.   Keep baths or showers short (5 minutes) in warm (not hot) water. Use mild cleansers for bathing. These should be unscented. You may add nonperfumed bath oil to the bath water. It is best to avoid soap and bubble bath.   Immediately after a bath or shower, when the skin is still damp, apply a moisturizing ointment to the entire body. This ointment should be a petroleum ointment. This will seal in moisture and help prevent dryness. The thicker the ointment, the better. These should be unscented.   Keep fingernails cut short. Children with eczema may need to wear soft gloves or mittens at night after applying an ointment.   Dress in clothes made of cotton or cotton blends. Dress lightly, because heat increases itching.   A child with eczema should stay away from anyone with fever blisters or cold sores. The virus that causes fever blisters (herpes simplex) can cause a serious skin infection in children with eczema. SEEK MEDICAL CARE IF:   Your itching interferes with sleep.   Your rash gets worse or is not better within 1 week after starting treatment.   You see pus or soft yellow scabs in the rash area.   You have a fever.   You have a rash flare-up after contact with someone who has fever blisters.  Document Released: 04/08/2000 Document Revised: 01/30/2013 Document Reviewed: 11/12/2012 [employer] Patient Information 2015 [employer]. This information   is not intended to replace advice given to you by your health care provider. Make sure you discuss any questions you have with your health care provider.  

## 2014-09-11 ENCOUNTER — Encounter: Payer: Self-pay | Admitting: Internal Medicine

## 2014-09-11 LAB — HIV ANTIBODY (ROUTINE TESTING W REFLEX): HIV 1&2 Ab, 4th Generation: NONREACTIVE

## 2014-09-11 LAB — RPR

## 2014-09-11 NOTE — Progress Notes (Signed)
Subjective:  Patient ID: Keith Walker, male    DOB: 06/27/1985  Age: 29 y.o. MRN: [id number]  CC: Rash   HPI Keith Walker presents for follow up and evaluation of an itchy rash on his arms and legs for several weeks - he has not treated it in any way.  Outpatient Prescriptions Prior to Visit  Medication Sig Dispense Refill  . Adalimumab (HUMIRA PEN) 40 MG/0.8ML PNKT Inject 40 mg into the muscle every 21 ( twenty-one) days. Patient adminsters IM himself    . ALPRAZolam (XANAX) 1 MG tablet Take 1 tablet (1 mg total) by mouth 3 (three) times daily as needed for anxiety or sleep. 60 tablet 1  . atomoxetine (STRATTERA) 80 MG capsule Take 1 capsule (80 mg total) by mouth daily. 30 capsule 11  . DULoxetine (CYMBALTA) 30 MG capsule Take 1 capsule (30 mg total) by mouth daily. 30 capsule 1  . emtricitabine-tenofovir (TRUVADA) 200-300 MG per tablet Take 1 tablet by mouth daily. 90 tablet 1  . ketoconazole (NIZORAL) 2 % cream Apply 1 application topically 2 (two) times daily. 60 g 11  . mesalamine (LIALDA) 1.2 G EC tablet Take 4 tablets (4.8 g total) by mouth daily with breakfast. 120 tablet 1  . pregabalin (LYRICA) 75 MG capsule Take 75 mg by mouth 2 (two) times daily.    . Testosterone (ANDROGEL PUMP) 20.25 MG/ACT (1.62%) GEL Place 2 Act onto the skin daily. 75 g 5  . traZODone (DESYREL) 100 MG tablet Take 3 tablets (300 mg total) by mouth at bedtime. 90 tablet 5  . vardenafil (LEVITRA) 10 MG tablet Take 1 tablet (10 mg total) by mouth daily as needed for erectile dysfunction. 10 tablet 11  . varenicline (CHANTIX CONTINUING MONTH PAK) 1 MG tablet Take 1 tablet (1 mg total) by mouth 2 (two) times daily. 60 tablet 3  . varenicline (CHANTIX STARTING MONTH PAK) 0.5 MG X 11 & 1 MG X 42 tablet Take one 0.5 mg tablet by mouth once daily for 3 days, then increase to one 0.5 mg tablet twice daily for 4 days, then increase to one 1 mg tablet twice daily. 53 tablet 0  . oxyCODONE-acetaminophen  (PERCOCET) 7.5-325 MG per tablet Take 1 tablet by mouth every 4 (four) hours as needed for pain. 25 tablet 0   No facility-administered medications prior to visit.    ROS Review of Systems  Constitutional: Negative.  Negative for fever, chills, diaphoresis, appetite change and fatigue.  HENT: Negative.  Negative for facial swelling, trouble swallowing and voice change.   Eyes: Negative.   Respiratory: Negative.  Negative for cough, choking, chest tightness, shortness of breath and stridor.   Cardiovascular: Negative.  Negative for chest pain, palpitations and leg swelling.  Gastrointestinal: Negative.  Negative for nausea, vomiting, abdominal pain, diarrhea, constipation, blood in stool and rectal pain.  Endocrine: Negative.   Genitourinary: Negative.  Negative for urgency, frequency, hematuria, flank pain, decreased urine volume, discharge, penile swelling, scrotal swelling, difficulty urinating, genital sores, penile pain and testicular pain.  Musculoskeletal: Positive for back pain and arthralgias. Negative for myalgias, gait problem, neck pain and neck stiffness.  Skin: Positive for rash.  Allergic/Immunologic: Negative.   Neurological: Negative.   Hematological: Negative.  Negative for adenopathy. Does not bruise/bleed easily.  Psychiatric/Behavioral: Negative.     Objective:  BP 110/82 mmHg  Pulse 94  Temp(Src) 98.6 F (37 C) (Oral)  Resp 16  Ht 5\' 6"  (1.676 m)  Wt 219 lb (99.338 kg)  BMI 35.36 kg/m2  SpO2 98%  BP Readings from Last 3 Encounters:  09/10/14 110/82  07/25/14 130/80  06/27/14 128/90    Wt Readings from Last 3 Encounters:  09/10/14 219 lb (99.338 kg)  07/25/14 221 lb (100.245 kg)  06/27/14 221 lb (100.245 kg)    Physical Exam  Constitutional: He is oriented to person, place, and time. He appears well-developed and well-nourished.  Non-toxic appearance. He does not have a sickly appearance. He does not appear ill. No distress.  HENT:  Head:  Normocephalic and atraumatic.  Mouth/Throat: Oropharynx is clear and moist. No oropharyngeal exudate.  Eyes: Conjunctivae are normal. Right eye exhibits no discharge. Left eye exhibits no discharge. No scleral icterus.  Neck: Normal range of motion. Neck supple. No JVD present. No tracheal deviation present. No thyromegaly present.  Cardiovascular: Normal rate, regular rhythm, normal heart sounds and intact distal pulses.  Exam reveals no gallop and no friction rub.   No murmur heard. Pulmonary/Chest: Effort normal. No stridor. No respiratory distress. He has no wheezes. He has no rales. He exhibits no tenderness.  Abdominal: Soft. Bowel sounds are normal. He exhibits no distension and no mass. There is no tenderness. There is no rebound and no guarding.  Musculoskeletal: Normal range of motion. He exhibits no edema or tenderness.  Lymphadenopathy:    He has no cervical adenopathy.  Neurological: He is oriented to person, place, and time.  Skin: Skin is warm and dry. Rash noted. No purpura noted. Rash is papular. Rash is not macular, not maculopapular, not nodular, not pustular, not vesicular and not urticarial. He is not diaphoretic. No erythema. No pallor.  There are scattered, scaly/excoriated papules over the UE and LE, there are no macules and no lesions on the palms or soles.  Psychiatric: He has a normal mood and affect. His behavior is normal. Judgment and thought content normal.  Vitals reviewed.   Lab Results  Component Value Date   WBC 6.1 09/10/2014   HGB 16.4 09/10/2014   HCT 46.5 09/10/2014   PLT 240.0 09/10/2014   GLUCOSE 82 09/10/2014   CHOL 167 02/20/2012   TRIG 163* 02/20/2012   HDL 43 02/20/2012   LDLCALC 91 02/20/2012   ALT 20 09/10/2014   AST 20 09/10/2014   NA 135 09/10/2014   K 3.9 09/10/2014   CL 100 09/10/2014   CREATININE 0.92 09/10/2014   BUN 12 09/10/2014   CO2 30 09/10/2014   TSH 1.50 06/12/2014    Dg Chest 2 View  12/13/2013   CLINICAL DATA:   Cough and chest pain.  EXAM: CHEST  2 VIEW  COMPARISON:  Abdominal radiographs 11/30/2013  FINDINGS: Heart and mediastinum are within normal limits. The trachea is midline. There are linear densities at the lung bases that are most compatible with atelectasis. No evidence for airspace disease or pleural effusions. No acute bone abnormalities.  IMPRESSION: Basilar atelectasis.  No evidence for airspace disease.   Electronically Signed   By: Richarda OverlieAdam  Henn M.D.   On: 12/13/2013 15:30    Assessment & Plan:   Joni Reiningickolas was seen today for rash.  Diagnoses and all orders for this visit:  Chronic fatigue Orders: -     Comprehensive metabolic panel; Future -     CBC with Differential/Platelet; Future  Prophylactic measure  Ulcerative colitis, with fistula - there are no s/s related to this at this time and he is followed closely by GI and general surgery  Hypogonadism in male - he is  much improved on T replacement therapy Orders: -     Comprehensive metabolic panel; Future -     CBC with Differential/Platelet; Future  Inflammatory spondylopathy, unspecified spinal region Orders: -     Comprehensive metabolic panel; Future -     Urinalysis, Routine w reflex microscopic; Future  Rash and nonspecific skin eruption Orders: -     RPR; Future -     HIV antibody; Future  Eczema, dyshidrotic Orders: -     triamcinolone cream (KENALOG) 0.5 %; Apply 1 application topically 3 (three) times daily.   I have discontinued Mr. Geister oxyCODONE-acetaminophen. I am also having him start on triamcinolone cream. Additionally, I am having him maintain his pregabalin, Adalimumab, ketoconazole, ALPRAZolam, emtricitabine-tenofovir, DULoxetine, vardenafil, varenicline, varenicline, atomoxetine, mesalamine, traZODone, and Testosterone.  Meds ordered this encounter  Medications  . triamcinolone cream (KENALOG) 0.5 %    Sig: Apply 1 application topically 3 (three) times daily.    Dispense:  30 g    Refill:   2     Follow-up: Return in about 2 months (around 11/10/2014).  Sanda Linger, MD

## 2014-09-23 ENCOUNTER — Encounter: Payer: Self-pay | Admitting: Internal Medicine

## 2014-09-23 ENCOUNTER — Ambulatory Visit (INDEPENDENT_AMBULATORY_CARE_PROVIDER_SITE_OTHER): Payer: Managed Care, Other (non HMO) | Admitting: Internal Medicine

## 2014-09-23 VITALS — BP 118/64 | HR 88 | Ht 66.0 in | Wt 218.4 lb

## 2014-09-23 DIAGNOSIS — R6881 Early satiety: Secondary | ICD-10-CM

## 2014-09-23 DIAGNOSIS — K6389 Other specified diseases of intestine: Secondary | ICD-10-CM

## 2014-09-23 DIAGNOSIS — K219 Gastro-esophageal reflux disease without esophagitis: Secondary | ICD-10-CM

## 2014-09-23 DIAGNOSIS — K610 Anal abscess: Secondary | ICD-10-CM | POA: Diagnosis not present

## 2014-09-23 DIAGNOSIS — M1990 Unspecified osteoarthritis, unspecified site: Secondary | ICD-10-CM

## 2014-09-23 DIAGNOSIS — F17201 Nicotine dependence, unspecified, in remission: Secondary | ICD-10-CM

## 2014-09-23 DIAGNOSIS — K529 Noninfective gastroenteritis and colitis, unspecified: Secondary | ICD-10-CM

## 2014-09-23 DIAGNOSIS — Z720 Tobacco use: Secondary | ICD-10-CM

## 2014-09-23 DIAGNOSIS — M064 Inflammatory polyarthropathy: Secondary | ICD-10-CM

## 2014-09-23 MED ORDER — MESALAMINE 1.2 G PO TBEC: 4.8000 g | DELAYED_RELEASE_TABLET | Freq: Every day | ORAL | Status: DC

## 2014-09-23 MED ORDER — PANTOPRAZOLE SODIUM 40 MG PO TBEC: 40.0000 mg | DELAYED_RELEASE_TABLET | Freq: Every day | ORAL | Status: DC

## 2014-09-23 NOTE — Patient Instructions (Signed)
  We have sent the following medications to your pharmacy for you to pick up at your convenience:  Lialda, Pantoprazole  Continue your Humira  Please follow up with Dr. Rhea BeltonPyrtle in 6 months   Make sure you to keep your appointment with Dr. Maisie Fushomas on 11/10/2014

## 2014-09-23 NOTE — Progress Notes (Signed)
Subjective:    Patient ID: Keith Walker, male    DOB: [birth year], 29 y.o.   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 I last saw him on 01/01/2014. After this he came for colonoscopy which was performed on 02/10/2014. This revealed normal terminal ileum. The colonic mucosa appeared normal and the entire colon from the cecum to the proximal rectum. There was distal proctitis and an area of ulceration in the extreme distal rectum. Multiple biopsies were. The right colon was benign colonic mucosa. The left colon was benign with focal lymphoid aggregates. The rectal biopsy showed prolapse type changes and inflammatory exudate. He reports he has been doing very well recently with several exceptions. He is on Humira 40 mg every 14 days. No colitis symptoms. No joint symptoms. No issues with urethritis. Recently he started testosterone replacement and is noted dramatic increase in energy. He is using trazodone at night which helps tremendously with sleep. He is also on Chantix and has not smoked since just before Easter.  Since I last saw him he has had 3 episodes of perianal abscess requiring incision and drainage. He's been referred to Dr. Maisie Fus of colorectal surgery and has an appointment in a few weeks. These are quite painful and they occur and are treated with antibody. Her current perianal pain, bleeding or leakage/seepage. He also has noticed some early satiety and increase in reflux GERD symptoms. Previously he took PPI which work well but weaned off and was not having recurrent issue with heartburn until somewhat recently. Again he has recently been on Chantix and also testosterone  He has established primary care with Dr. Yetta Barre  Review of Systems As per history of present illness,  otherwise negative  Current Medications, Allergies, Past Medical History, Past Surgical History, Family History and Social History were reviewed in [facility] electronic medical record.     Objective:   Physical Exam BP 118/64 mmHg  Pulse 88  Ht  (1.676 m)  Wt 218 lb 6 oz (99.054 kg)  BMI 35.26 kg/m2 Constitutional: Well-developed and well-nourished. No distress. HEENT: Normocephalic and atraumatic. Oropharynx is clear and moist. No oropharyngeal exudate. Conjunctivae are normal.  No scleral icterus. Neck: Neck supple. Trachea midline. Cardiovascular: Normal rate, regular rhythm and intact distal pulses. No M/R/G Pulmonary/chest: Effort normal and breath sounds normal. No wheezing, rales or rhonchi. Abdominal: Soft, nontender, nondistended. Bowel sounds active throughout. There are no masses palpable. No hepatosplenomegaly. Extremities: no clubbing, cyanosis, or edema Lymphadenopathy: No cervical adenopathy noted. Neurological: Alert and oriented to person place and time. Skin: Skin is warm and dry. No rashes noted. Psychiatric: Normal mood and affect. Behavior is normal.  CBC    Component Value Date/Time   WBC 6.1 09/10/2014 1618   RBC 5.01 09/10/2014 1618   HGB 16.4 09/10/2014 1618   HCT 46.5 09/10/2014 1618   PLT 240.0 09/10/2014 1618   MCV 92.9 09/10/2014 1618   MCH 32.6 12/13/2013 1409   MCHC 35.3 09/10/2014 1618   RDW 12.9 09/10/2014 1618   LYMPHSABS 2.2 09/10/2014 1618   MONOABS 0.6 09/10/2014 1618   EOSABS 0.1 09/10/2014 1618   BASOSABS 0.0 09/10/2014 1618    CMP     Component Value Date/Time   NA 135 09/10/2014 1618   K 3.9 09/10/2014 1618   CL 100 09/10/2014 1618   CO2  30 09/10/2014 1618   GLUCOSE 82 09/10/2014 1618   BUN 12 09/10/2014 1618   CREATININE 0.92 09/10/2014 1618   CREATININE 0.75 12/13/2013 1409   CALCIUM 9.6 09/10/2014 1618   PROT 7.7 09/10/2014 1618   ALBUMIN 4.5 09/10/2014 1618   AST 20 09/10/2014 1618   ALT 20 09/10/2014  1618   ALKPHOS 53 09/10/2014 1618   BILITOT 0.7 09/10/2014 1618   GFRNONAA >90 11/30/2013 1234   GFRAA >90 11/30/2013 1234    HIV recently neg B12 normal RPR negative Thyroid testing normal Testosterone low but improved after replacement     Assessment & Plan:  29 year old male with past medical history of 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  1. IBD with inflammatory arthropathy/Behcet's -- he has carried a diagnosis of left-sided UC but there has not been significant mucosal inflammation in the left colon for some time. He did have some distal rectal inflammation and given his recurrent perianal abscesses, this is strongly suggestive of Crohn's disease with perianal involvement.  Crohn's is also associated with inflammatory arthropathy that he has been dealing with. This has improved dramatically with Humira. Continue Humira 40 mg every 14 days. Continue Lialda 2.8 g daily. I agree with surgical consultation with Dr. Maisie Fushomas as there may be a surgical procedure to prevent recurrent perianal abscesses such as fistulotomy. He continues to follow with Dr. Dierdre ForthBeekman of rheumatology  2. GERD -- resume pantoprazole 40 mg daily, 30 meds before breakfast  3. Early satiety -- may be somewhat related to reflux disease but more likely related to medication either Chantix or even testosterone replacement. We will see if this improves with PPI as discussed in #2. We will also see if it improves once Chantix is stopped. If it continues or worsens upper endoscopy will be recommended  4. Tobacco use -- off now with the help of Chantix. He is congratulated and this should improve IBD as well  6 month followup

## 2014-10-07 ENCOUNTER — Other Ambulatory Visit: Payer: Self-pay | Admitting: General Surgery

## 2014-10-07 NOTE — H&P (Signed)
Keith Walker 10/07/2014 8:42 AM Location: [facility] Patient #: [id number] DOB: 02-27-1986 Single / Language: Keith Walker / Race: White Male History of Present Illness Keith Ruff MD; 5/00/3704 9:18 AM) The patient is a 29 year old male who presents with anal fistula. The patient is a 29 year old male with inflammatory bowel disease. He was originally diagnosed with ulcerative colitis at age 89. He has been on lialda and Humira for the past 2 years with very few problems. He reports 1-2 loose bowel movements a day. He denies any blood in his stools. He has had approximately 8-9 flareups of his perirectal abscess over the past 6 months. He continues to have perirectal drainage and bleeding. 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 Sit in warm water tub bath with Epson salt at least twice a day and after BMs. Use Percocet as needed for pain control.  Other Problems Keith Ruff, MD; 4/50/3888 9:18 AM) Anxiety Disorder Arthritis Crohn's Disease Gastroesophageal Reflux Disease Other disease, cancer, significant illness Sleep Apnea Ulcerative Colitis Gastric Ulcer Hemorrhoids  Past Surgical History [name], [place]; 10/07/2014 8:43 AM) Oral Surgery  Diagnostic Studies History [name], [name]; 10/07/2014 8:43 AM) Colonoscopy within last year  Allergies [name], [name]; 10/07/2014 8:43 AM) No Known Drug Allergies 07/25/2014  Medication History [name], [name]; 10/07/2014 8:43 AM) Augmentin (875-125MG Tablet, 1 (one) Tablet Oral two times daily, Taken starting 07/25/2014) Active. ALPRAZolam (1MG Tablet, Oral) Active. Lyrica (75MG Capsule, Oral) Active. Chantix (1MG Tablet, Oral) Active. Chantix Starting Month Pak (0.5 MG X 11 &1 MG X 42 Tablet, Oral) Active. Desonide (0.05% Lotion, External) Active. DULoxetine HCl  (30MG Capsule DR Part, Oral) Active. DULoxetine HCl (60MG Capsule DR Part, Oral) Active. Humira Pen (40MG/0.8ML Pen-inj Kit, Subcutaneous) Active. Ketoconazole (2% Cream, External) Active. Levitra (10MG Tablet, Oral) Active. Lialda (1.2GM Tablet DR, Oral) Active. Moxifloxacin HCl (400MG Tablet, Oral) Active. Restasis (0.05% Emulsion, Ophthalmic) Active. Strattera (80MG Capsule, Oral) Active. Terbinafine HCl (250MG Tablet, Oral) Active. TraZODone HCl (100MG Tablet, Oral) Active. Truvada (200-300MG Tablet, Oral) Active. Medications Reconciled  Social History Keith Ruff, MD; 2/80/0349 9:18 AM) Tobacco use Former smoker. Alcohol use Moderate alcohol use. Caffeine use Coffee. No drug use  Family History Keith Ruff, MD; 1/79/1505 9:18 AM) Migraine Headache Mother. Breast Cancer Mother. Heart disease in male family member before age 63 Heart disease in male family member before age 69 Alcohol Abuse Brother. Anesthetic complications Mother.  Note:3 times a day     Review of Systems [name] [name]; 10/07/2014 8:43 AM) General Present- Appetite Loss. Not Present- Chills, Fatigue, Fever, Night Sweats, Weight Gain and Weight Loss. Skin Present- Dryness. Not Present- Change in Wart/Mole, Hives, Jaundice, New Lesions, Non-Healing Wounds, Rash and Ulcer. HEENT Present- Seasonal Allergies. Not Present- Earache, Hearing Loss, Hoarseness, Nose Bleed, Oral Ulcers, Ringing in the Ears, Sinus Pain, Sore Throat, Visual Disturbances, Wears glasses/contact lenses and Yellow Eyes. Respiratory Not Present- Bloody sputum, Chronic Cough, Difficulty Breathing, Snoring and Wheezing. Breast Not Present- Breast Mass, Breast Pain, Nipple Discharge and Skin Changes. Cardiovascular Not Present- Chest Pain, Difficulty Breathing Lying Down, Leg Cramps, Palpitations, Rapid Heart Rate, Shortness of Breath and Swelling of Extremities. Gastrointestinal Present- Abdominal Pain, Gets  full quickly at meals, Indigestion and Rectal Pain. Not Present- Bloating, Bloody Stool, Change in Bowel Habits, Chronic diarrhea, Constipation, Difficulty Swallowing, Excessive gas, Hemorrhoids, Nausea and Vomiting. Male Genitourinary Not Present- Blood  in Urine, Change in Urinary Stream, Frequency, Impotence, Nocturia, Painful Urination, Urgency and Urine Leakage. Musculoskeletal Present- Back Pain. Not Present- Joint Pain, Joint Stiffness, Muscle Pain, Muscle Weakness and Swelling of Extremities. Neurological Not Present- Decreased Memory, Fainting, Headaches, Numbness, Seizures, Tingling, Tremor, Trouble walking and Weakness. Psychiatric Present- Anxiety. Not Present- Bipolar, Change in Sleep Pattern, Depression, Fearful and Frequent crying. Endocrine Not Present- Cold Intolerance, Excessive Hunger, Hair Changes, Heat Intolerance, Hot flashes and New Diabetes. Hematology Not Present- Easy Bruising, Excessive bleeding, Gland problems, HIV and Persistent Infections.  Vitals [name] [name]; 10/07/2014 8:44 AM) 10/07/2014 8:44 AM Weight: 216 lb Height: 66in Body Surface Area: 2.14 m Body Mass Index: 34.86 kg/m Temp.: 98.15F(Oral)  Pulse: 89 (Regular)  Resp.: 18 (Unlabored)  BP: 130/70 (Sitting, Left Arm, Standard)     Physical Exam Keith Ruff MD; 6/94/8546 9:19 AM)  General Mental Status-Alert. General Appearance-Consistent with stated age. Hydration-Well hydrated. Voice-Normal.  Head and Neck Head-normocephalic, atraumatic with no lesions or palpable masses. Trachea-midline. Thyroid Gland Characteristics - normal size and consistency.  Eye Eyeball - Bilateral-Extraocular movements intact. Sclera/Conjunctiva - Bilateral-No scleral icterus.  Chest and Lung Exam Chest and lung exam reveals -quiet, even and easy respiratory effort with no use of accessory muscles and on auscultation, normal breath sounds, no adventitious sounds and normal vocal  resonance. Inspection Chest Wall - Normal. Back - normal.  Cardiovascular Cardiovascular examination reveals -normal heart sounds, regular rate and rhythm with no murmurs and normal pedal pulses bilaterally.  Abdomen Inspection Inspection of the abdomen reveals - No Hernias. Palpation/Percussion Palpation and Percussion of the abdomen reveal - Soft, Non Tender, No Rebound tenderness, No Rigidity (guarding) and No hepatosplenomegaly. Auscultation Auscultation of the abdomen reveals - Bowel sounds normal.  Rectal Anorectal Exam External - Note: Small punctate lesion draining purulence and blood at posterior midline. Internal - Note: Normal sphincter tone, no signs of internal opening palpated.  Neurologic Neurologic evaluation reveals -alert and oriented x 3 with no impairment of recent or remote memory. Mental Status-Normal.  Musculoskeletal Global Assessment -Note:no gross deformities.  Normal Exam - Left-Upper Extremity Strength Normal and Lower Extremity Strength Normal. Normal Exam - Right-Upper Extremity Strength Normal and Lower Extremity Strength Normal.    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 I have recommended seton placement to allow for the area to heal and scar. I think that most likely we will be able to remove this in the office after several months. Risk of surgery were discussed with patient. These are mostly bleeding, pain and recurrence.

## 2014-10-08 ENCOUNTER — Other Ambulatory Visit (HOSPITAL_COMMUNITY): Payer: Self-pay | Admitting: *Deleted

## 2014-10-08 NOTE — Patient Instructions (Addendum)
Keith Walker  10/08/2014   Your procedure is scheduled on: 10-10-14  Report to [facility] Main  Entrance and follow signs to               Short Stay Center at 530.  Call this number if you have problems the morning of surgery [phone]   Remember: ONLY 1 PERSON MAY GO WITH YOU TO SHORT STAY TO GET  READY MORNING OF YOUR SURGERY.  Do not eat food or drink liquids :After Midnight.     Take these medicines the morning of surgery with A SIP OF WATER: Truvada, Pantaprazole, Lyrica                               You may not have any metal on your body including hair pins and              piercings  Do not wear jewelry, make-up, lotions, powders or perfumes, deodorant             Do not wear nail polish.  Do not shave  48 hours prior to surgery.              Men may shave face and neck.   Do not bring valuables to the hospital. [place] IS NOT             RESPONSIBLE   FOR VALUABLES.  Contacts, dentures or bridgework may not be worn into surgery.  Leave suitcase in the car. After surgery it may be brought to your room.     Patients discharged the day of surgery will not be allowed to drive home.  Name and phone number of your driver: Ton Summersett cell [phone]  Special Instructions: N/A              Please read over the following fact sheets you were given: _____________________________________________________________________             [facility] - Preparing for Surgery Before surgery, you can play an important role.  Because skin is not sterile, your skin needs to be as free of germs as possible.  You can reduce the number of germs on your skin by washing with CHG (chlorahexidine gluconate) soap before surgery.  CHG is an antiseptic cleaner which kills germs and bonds with the skin to continue killing germs even after washing. Please DO NOT use if you have an allergy to CHG or antibacterial soaps.  If your skin becomes reddened/irritated  stop using the CHG and inform your nurse when you arrive at Short Stay. Do not shave (including legs and underarms) for at least 48 hours prior to the first CHG shower.  You may shave your face/neck. Please follow these instructions carefully:  1.  Shower with CHG Soap the night before surgery and the  morning of Surgery.  2.  If you choose to wash your hair, wash your hair first as usual with your  normal  shampoo.  3.  After you shampoo, rinse your hair and body thoroughly to remove the  shampoo.                           4.  Use CHG as you would any other liquid soap.  You can apply chg directly  to the skin and wash  Gently with a scrungie or clean washcloth.  5.  Apply the CHG Soap to your body ONLY FROM THE NECK DOWN.   Do not use on face/ open                           Wound or open sores. Avoid contact with eyes, ears mouth and genitals (private parts).                       Wash face,  Genitals (private parts) with your normal soap.             6.  Wash thoroughly, paying special attention to the area where your surgery  will be performed.  7.  Thoroughly rinse your body with warm water from the neck down.  8.  DO NOT shower/wash with your normal soap after using and rinsing off  the CHG Soap.                9.  Pat yourself dry with a clean towel.            10.  Wear clean pajamas.            11.  Place clean sheets on your bed the night of your first shower and do not  sleep with pets. Day of Surgery : Do not apply any lotions/deodorants the morning of surgery.  Please wear clean clothes to the hospital/surgery center.  FAILURE TO FOLLOW THESE INSTRUCTIONS MAY RESULT IN THE CANCELLATION OF YOUR SURGERY PATIENT SIGNATURE_________________________________  NURSE SIGNATURE__________________________________  ________________________________________________________________________

## 2014-10-08 NOTE — Progress Notes (Signed)
Chest xray 12-13-13 epic Sleep study 05-15-12 epic ekg 01-24-14 epic

## 2014-10-09 ENCOUNTER — Encounter (HOSPITAL_COMMUNITY): Payer: Self-pay

## 2014-10-09 ENCOUNTER — Encounter (HOSPITAL_COMMUNITY)
Admission: RE | Admit: 2014-10-09 | Discharge: 2014-10-09 | Disposition: A | Discharge status: Home / Self Care | Payer: PRIVATE HEALTH INSURANCE | Source: Ambulatory Visit | Attending: General Surgery | Admitting: General Surgery

## 2014-10-09 DIAGNOSIS — M1990 Unspecified osteoarthritis, unspecified site: Secondary | ICD-10-CM | POA: Diagnosis not present

## 2014-10-09 DIAGNOSIS — Z6834 Body mass index (BMI) 34.0-34.9, adult: Secondary | ICD-10-CM | POA: Diagnosis not present

## 2014-10-09 DIAGNOSIS — Z7952 Long term (current) use of systemic steroids: Secondary | ICD-10-CM | POA: Diagnosis not present

## 2014-10-09 DIAGNOSIS — Z79899 Other long term (current) drug therapy: Secondary | ICD-10-CM | POA: Diagnosis not present

## 2014-10-09 DIAGNOSIS — F419 Anxiety disorder, unspecified: Secondary | ICD-10-CM | POA: Diagnosis not present

## 2014-10-09 DIAGNOSIS — K603 Anal fistula: Secondary | ICD-10-CM | POA: Diagnosis not present

## 2014-10-09 DIAGNOSIS — G4730 Sleep apnea, unspecified: Secondary | ICD-10-CM | POA: Diagnosis not present

## 2014-10-09 DIAGNOSIS — K219 Gastro-esophageal reflux disease without esophagitis: Secondary | ICD-10-CM | POA: Diagnosis not present

## 2014-10-09 DIAGNOSIS — Z792 Long term (current) use of antibiotics: Secondary | ICD-10-CM | POA: Diagnosis not present

## 2014-10-09 DIAGNOSIS — K649 Unspecified hemorrhoids: Secondary | ICD-10-CM | POA: Diagnosis not present

## 2014-10-09 DIAGNOSIS — K5190 Ulcerative colitis, unspecified, without complications: Secondary | ICD-10-CM | POA: Diagnosis not present

## 2014-10-09 DIAGNOSIS — F909 Attention-deficit hyperactivity disorder, unspecified type: Secondary | ICD-10-CM | POA: Diagnosis not present

## 2014-10-09 DIAGNOSIS — Z87891 Personal history of nicotine dependence: Secondary | ICD-10-CM | POA: Diagnosis not present

## 2014-10-09 HISTORY — DX: Sleep apnea, unspecified: G47.30

## 2014-10-09 LAB — BASIC METABOLIC PANEL
Anion gap: 10 (ref 5–15)
BUN: 13 mg/dL (ref 6–20)
CO2: 26 mmol/L (ref 22–32)
CREATININE: 0.97 mg/dL (ref 0.61–1.24)
Calcium: 9.9 mg/dL (ref 8.9–10.3)
Chloride: 100 mmol/L — ABNORMAL LOW (ref 101–111)
Glucose, Bld: 88 mg/dL (ref 65–99)
Potassium: 4 mmol/L (ref 3.5–5.1)
Sodium: 136 mmol/L (ref 135–145)

## 2014-10-09 LAB — CBC
HEMATOCRIT: 49.3 % (ref 39.0–52.0)
Hemoglobin: 17.2 g/dL — ABNORMAL HIGH (ref 13.0–17.0)
MCH: 33.1 pg (ref 26.0–34.0)
MCHC: 34.9 g/dL (ref 30.0–36.0)
MCV: 95 fL (ref 78.0–100.0)
Platelets: 205 10*3/uL (ref 150–400)
RBC: 5.19 MIL/uL (ref 4.22–5.81)
RDW: 12.4 % (ref 11.5–15.5)
WBC: 6.1 10*3/uL (ref 4.0–10.5)

## 2014-10-10 ENCOUNTER — Ambulatory Visit (HOSPITAL_COMMUNITY)
Admission: RE | Admit: 2014-10-10 | Discharge: 2014-10-10 | Disposition: A | Discharge status: Home / Self Care | Payer: PRIVATE HEALTH INSURANCE | Source: Ambulatory Visit | Attending: General Surgery | Admitting: General Surgery

## 2014-10-10 ENCOUNTER — Ambulatory Visit (HOSPITAL_COMMUNITY): Payer: PRIVATE HEALTH INSURANCE | Admitting: Anesthesiology

## 2014-10-10 ENCOUNTER — Encounter (HOSPITAL_COMMUNITY): Payer: Self-pay

## 2014-10-10 ENCOUNTER — Encounter (HOSPITAL_COMMUNITY)
Admission: RE | Disposition: A | Discharge status: Home / Self Care | Payer: Self-pay | Source: Ambulatory Visit | Attending: General Surgery

## 2014-10-10 DIAGNOSIS — Z7952 Long term (current) use of systemic steroids: Secondary | ICD-10-CM | POA: Insufficient documentation

## 2014-10-10 DIAGNOSIS — G4730 Sleep apnea, unspecified: Secondary | ICD-10-CM | POA: Insufficient documentation

## 2014-10-10 DIAGNOSIS — Z792 Long term (current) use of antibiotics: Secondary | ICD-10-CM | POA: Insufficient documentation

## 2014-10-10 DIAGNOSIS — F419 Anxiety disorder, unspecified: Secondary | ICD-10-CM | POA: Insufficient documentation

## 2014-10-10 DIAGNOSIS — F909 Attention-deficit hyperactivity disorder, unspecified type: Secondary | ICD-10-CM | POA: Insufficient documentation

## 2014-10-10 DIAGNOSIS — K649 Unspecified hemorrhoids: Secondary | ICD-10-CM | POA: Insufficient documentation

## 2014-10-10 DIAGNOSIS — K603 Anal fistula: Secondary | ICD-10-CM | POA: Insufficient documentation

## 2014-10-10 DIAGNOSIS — K5190 Ulcerative colitis, unspecified, without complications: Secondary | ICD-10-CM | POA: Insufficient documentation

## 2014-10-10 DIAGNOSIS — Z87891 Personal history of nicotine dependence: Secondary | ICD-10-CM | POA: Insufficient documentation

## 2014-10-10 DIAGNOSIS — M1990 Unspecified osteoarthritis, unspecified site: Secondary | ICD-10-CM | POA: Insufficient documentation

## 2014-10-10 DIAGNOSIS — Z79899 Other long term (current) drug therapy: Secondary | ICD-10-CM | POA: Insufficient documentation

## 2014-10-10 DIAGNOSIS — Z6834 Body mass index (BMI) 34.0-34.9, adult: Secondary | ICD-10-CM | POA: Insufficient documentation

## 2014-10-10 DIAGNOSIS — K219 Gastro-esophageal reflux disease without esophagitis: Secondary | ICD-10-CM | POA: Insufficient documentation

## 2014-10-10 HISTORY — PX: PLACEMENT OF SETON: SHX6029

## 2014-10-10 HISTORY — PX: RECTAL EXAM UNDER ANESTHESIA: SHX6399

## 2014-10-10 SURGERY — EXAM UNDER ANESTHESIA, RECTUM
Anesthesia: Monitor Anesthesia Care

## 2014-10-10 MED ORDER — BUPIVACAINE HCL (PF) 0.25 % IJ SOLN
INTRAMUSCULAR | Status: AC
  Filled 2014-10-10: qty 30

## 2014-10-10 MED ORDER — PROPOFOL 10 MG/ML IV BOLUS
INTRAVENOUS | Status: AC
  Filled 2014-10-10: qty 20

## 2014-10-10 MED ORDER — BUPIVACAINE LIPOSOME 1.3 % IJ SUSP
20.0000 mL | Freq: Once | INTRAMUSCULAR | Status: DC
  Filled 2014-10-10: qty 20

## 2014-10-10 MED ORDER — 0.9 % SODIUM CHLORIDE (POUR BTL) OPTIME
TOPICAL | Status: DC | PRN
  Administered 2014-10-10: 1000 mL

## 2014-10-10 MED ORDER — MIDAZOLAM HCL 2 MG/2ML IJ SOLN
INTRAMUSCULAR | Status: AC
  Filled 2014-10-10: qty 2

## 2014-10-10 MED ORDER — DIBUCAINE 1 % RE OINT
TOPICAL_OINTMENT | RECTAL | Status: AC
  Filled 2014-10-10: qty 28

## 2014-10-10 MED ORDER — MEPERIDINE HCL 50 MG/ML IJ SOLN: 6.2500 mg | INTRAMUSCULAR | Status: DC | PRN

## 2014-10-10 MED ORDER — PROMETHAZINE HCL 25 MG/ML IJ SOLN
6.2500 mg | INTRAMUSCULAR | Status: DC | PRN
Start: 2014-10-10 — End: 2014-10-10

## 2014-10-10 MED ORDER — LACTATED RINGERS IV SOLN
INTRAVENOUS | Status: DC
  Administered 2014-10-10: 08:00:00 via INTRAVENOUS

## 2014-10-10 MED ORDER — MIDAZOLAM HCL 2 MG/2ML IJ SOLN: 0.5000 mg | Freq: Once | INTRAMUSCULAR | Status: DC | PRN

## 2014-10-10 MED ORDER — SODIUM CHLORIDE 0.9 % IV SOLN: 250.0000 mL | INTRAVENOUS | Status: DC | PRN

## 2014-10-10 MED ORDER — LIDOCAINE 5 % EX OINT: 1.0000 "application " | TOPICAL_OINTMENT | CUTANEOUS | Status: DC | PRN

## 2014-10-10 MED ORDER — SODIUM CHLORIDE 0.9 % IJ SOLN: 3.0000 mL | INTRAMUSCULAR | Status: DC | PRN

## 2014-10-10 MED ORDER — LACTATED RINGERS IV SOLN
INTRAVENOUS | Status: DC | PRN
  Administered 2014-10-10: 07:00:00 via INTRAVENOUS

## 2014-10-10 MED ORDER — ACETAMINOPHEN 325 MG PO TABS: 650.0000 mg | ORAL_TABLET | ORAL | Status: DC | PRN

## 2014-10-10 MED ORDER — HYDROMORPHONE HCL 1 MG/ML IJ SOLN: 0.2500 mg | INTRAMUSCULAR | Status: DC | PRN

## 2014-10-10 MED ORDER — OXYCODONE HCL 5 MG PO TABS
5.0000 mg | ORAL_TABLET | ORAL | Status: DC | PRN
  Administered 2014-10-10: 5 mg via ORAL
  Filled 2014-10-10: qty 1

## 2014-10-10 MED ORDER — ACETAMINOPHEN 650 MG RE SUPP
650.0000 mg | RECTAL | Status: DC | PRN
  Filled 2014-10-10: qty 1

## 2014-10-10 MED ORDER — BUPIVACAINE HCL (PF) 0.25 % IJ SOLN
INTRAMUSCULAR | Status: DC | PRN
  Administered 2014-10-10: 30 mL

## 2014-10-10 MED ORDER — SODIUM CHLORIDE 0.9 % IJ SOLN: 3.0000 mL | Freq: Two times a day (BID) | INTRAMUSCULAR | Status: DC

## 2014-10-10 MED ORDER — PROPOFOL 10 MG/ML IV BOLUS
INTRAVENOUS | Status: DC | PRN
  Administered 2014-10-10: 150 mg via INTRAVENOUS
  Administered 2014-10-10: 100 mg via INTRAVENOUS
  Administered 2014-10-10 (×3): 50 mg via INTRAVENOUS

## 2014-10-10 MED ORDER — FENTANYL CITRATE (PF) 100 MCG/2ML IJ SOLN
INTRAMUSCULAR | Status: AC
  Filled 2014-10-10: qty 2

## 2014-10-10 SURGICAL SUPPLY — 32 items
BLADE SURG 15 STRL LF DISP TIS (BLADE) ×1 IMPLANT
BLADE SURG 15 STRL SS (BLADE) ×1
BLADE SURG SZ10 CARB STEEL (BLADE) IMPLANT
BNDG GAUZE ELAST 4 BULKY (GAUZE/BANDAGES/DRESSINGS) ×2 IMPLANT
BRIEF STRETCH FOR OB PAD LRG (UNDERPADS AND DIAPERS) ×2 IMPLANT
COVER SURGICAL LIGHT HANDLE (MISCELLANEOUS) ×2 IMPLANT
DECANTER SPIKE VIAL GLASS SM (MISCELLANEOUS) ×2 IMPLANT
DRAPE LAPAROTOMY T 102X78X121 (DRAPES) ×2 IMPLANT
DRAPE SHEET LG 3/4 BI-LAMINATE (DRAPES) IMPLANT
ELECT REM PT RETURN 9FT ADLT (ELECTROSURGICAL) ×2
ELECTRODE REM PT RTRN 9FT ADLT (ELECTROSURGICAL) ×1 IMPLANT
GAUZE SPONGE 4X4 12PLY STRL (GAUZE/BANDAGES/DRESSINGS) IMPLANT
GAUZE SPONGE 4X4 16PLY XRAY LF (GAUZE/BANDAGES/DRESSINGS) ×2 IMPLANT
GLOVE BIOGEL PI IND STRL 7.0 (GLOVE) ×1 IMPLANT
GLOVE BIOGEL PI INDICATOR 7.0 (GLOVE) ×1
IV SET HUBERPLUS 22X1 SAFETY (NEEDLE) ×2 IMPLANT
KIT BASIN OR (CUSTOM PROCEDURE TRAY) ×2 IMPLANT
LUBRICANT JELLY K Y 4OZ (MISCELLANEOUS) ×2 IMPLANT
NDL SAFETY ECLIPSE 18X1.5 (NEEDLE) IMPLANT
NEEDLE HYPO 18GX1.5 SHARP (NEEDLE)
NEEDLE HYPO 25X1 1.5 SAFETY (NEEDLE) ×2 IMPLANT
NS IRRIG 1000ML POUR BTL (IV SOLUTION) ×2 IMPLANT
PACK BASIC VI WITH GOWN DISP (CUSTOM PROCEDURE TRAY) ×2 IMPLANT
PENCIL BUTTON HOLSTER BLD 10FT (ELECTRODE) ×2 IMPLANT
SHEARS HARMONIC 9CM CVD (BLADE) IMPLANT
SPONGE SURGIFOAM ABS GEL 100 (HEMOSTASIS) IMPLANT
SUT CHROMIC 2 0 SH (SUTURE) IMPLANT
SUT CHROMIC 3 0 SH 27 (SUTURE) IMPLANT
SUT ETHIBOND 3-0 V-5 (SUTURE) ×2 IMPLANT
SYR CONTROL 10ML LL (SYRINGE) ×6 IMPLANT
TOWEL OR 17X26 10 PK STRL BLUE (TOWEL DISPOSABLE) ×2 IMPLANT
YANKAUER SUCT BULB TIP 10FT TU (MISCELLANEOUS) ×2 IMPLANT

## 2014-10-10 NOTE — Discharge Instructions (Signed)
Beginning the day after surgery:  You may sit in a tub of warm water 2-3 times a day to relieve discomfort.  Eat a regular diet high in fiber.  Avoid foods that give you constipation or diarrhea.  Avoid foods that are difficult to digest, such as seeds, nuts, corn or popcorn.  Do not go any longer than 2 days without a bowel movement.  You may take a dose of Milk of Magnesia if you become constipated.    Drink 6-8 glasses of water daily.  Walking is encouraged.  Avoid strenuous activity and heavy lifting for one month after surgery.   Keep area around seton clean after bowel movements.   Call the office if you have any questions or concerns.  Call immediately if you develop:   Excessive rectal bleeding (more than a cup or passing large clots)  Increased discomfort  Fever greater than 100 F  Difficulty urinating

## 2014-10-10 NOTE — H&P (View-Only) (Signed)
Keith Walker 10/07/2014 8:42 AM Location: [facility] Patient #: [id number] DOB: 02-27-1986 Single / Language: Keith Walker / Race: White Male History of Present Illness Keith Ruff MD; 5/00/3704 9:18 AM) The patient is a 29 year old male who presents with anal fistula. The patient is a 29 year old male with inflammatory bowel disease. He was originally diagnosed with ulcerative colitis at age 89. He has been on lialda and Humira for the past 2 years with very few problems. He reports 1-2 loose bowel movements a day. He denies any blood in his stools. He has had approximately 8-9 flareups of his perirectal abscess over the past 6 months. He continues to have perirectal drainage and bleeding. 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 Sit in warm water tub bath with Epson salt at least twice a day and after BMs. Use Percocet as needed for pain control.  Other Problems Keith Ruff, MD; 4/50/3888 9:18 AM) Anxiety Disorder Arthritis Crohn's Disease Gastroesophageal Reflux Disease Other disease, cancer, significant illness Sleep Apnea Ulcerative Colitis Gastric Ulcer Hemorrhoids  Past Surgical History Keith Walker, [place]; 10/07/2014 8:43 AM) Oral Surgery  Diagnostic Studies History Keith Walker, [name]; 10/07/2014 8:43 AM) Colonoscopy within last year  Allergies Keith Walker, [name]; 10/07/2014 8:43 AM) No Known Drug Allergies 07/25/2014  Medication History Keith Walker, [name]; 10/07/2014 8:43 AM) Augmentin (875-125MG Tablet, 1 (one) Tablet Oral two times daily, Taken starting 07/25/2014) Active. ALPRAZolam (1MG Tablet, Oral) Active. Lyrica (75MG Capsule, Oral) Active. Chantix (1MG Tablet, Oral) Active. Chantix Starting Month Pak (0.5 MG X 11 &1 MG X 42 Tablet, Oral) Active. Desonide (0.05% Lotion, External) Active. DULoxetine HCl  (30MG Capsule DR Part, Oral) Active. DULoxetine HCl (60MG Capsule DR Part, Oral) Active. Humira Pen (40MG/0.8ML Pen-inj Kit, Subcutaneous) Active. Ketoconazole (2% Cream, External) Active. Levitra (10MG Tablet, Oral) Active. Lialda (1.2GM Tablet DR, Oral) Active. Moxifloxacin HCl (400MG Tablet, Oral) Active. Restasis (0.05% Emulsion, Ophthalmic) Active. Strattera (80MG Capsule, Oral) Active. Terbinafine HCl (250MG Tablet, Oral) Active. TraZODone HCl (100MG Tablet, Oral) Active. Truvada (200-300MG Tablet, Oral) Active. Medications Reconciled  Social History Keith Ruff, MD; 2/80/0349 9:18 AM) Tobacco use Former smoker. Alcohol use Moderate alcohol use. Caffeine use Coffee. No drug use  Family History Keith Ruff, MD; 1/79/1505 9:18 AM) Migraine Headache Mother. Breast Cancer Mother. Heart disease in male family member before age 63 Heart disease in male family member before age 69 Alcohol Abuse Brother. Anesthetic complications Mother.  Note:3 times a day     Review of Systems Keith Walker [name]; 10/07/2014 8:43 AM) General Present- Appetite Loss. Not Present- Chills, Fatigue, Fever, Night Sweats, Weight Gain and Weight Loss. Skin Present- Dryness. Not Present- Change in Wart/Mole, Hives, Jaundice, New Lesions, Non-Healing Wounds, Rash and Ulcer. HEENT Present- Seasonal Allergies. Not Present- Earache, Hearing Loss, Hoarseness, Nose Bleed, Oral Ulcers, Ringing in the Ears, Sinus Pain, Sore Throat, Visual Disturbances, Wears glasses/contact lenses and Yellow Eyes. Respiratory Not Present- Bloody sputum, Chronic Cough, Difficulty Breathing, Snoring and Wheezing. Breast Not Present- Breast Mass, Breast Pain, Nipple Discharge and Skin Changes. Cardiovascular Not Present- Chest Pain, Difficulty Breathing Lying Down, Leg Cramps, Palpitations, Rapid Heart Rate, Shortness of Breath and Swelling of Extremities. Gastrointestinal Present- Abdominal Pain, Gets  full quickly at meals, Indigestion and Rectal Pain. Not Present- Bloating, Bloody Stool, Change in Bowel Habits, Chronic diarrhea, Constipation, Difficulty Swallowing, Excessive gas, Hemorrhoids, Nausea and Vomiting. Male Genitourinary Not Present- Blood  in Urine, Change in Urinary Stream, Frequency, Impotence, Nocturia, Painful Urination, Urgency and Urine Leakage. Musculoskeletal Present- Back Pain. Not Present- Joint Pain, Joint Stiffness, Muscle Pain, Muscle Weakness and Swelling of Extremities. Neurological Not Present- Decreased Memory, Fainting, Headaches, Numbness, Seizures, Tingling, Tremor, Trouble walking and Weakness. Psychiatric Present- Anxiety. Not Present- Bipolar, Change in Sleep Pattern, Depression, Fearful and Frequent crying. Endocrine Not Present- Cold Intolerance, Excessive Hunger, Hair Changes, Heat Intolerance, Hot flashes and New Diabetes. Hematology Not Present- Easy Bruising, Excessive bleeding, Gland problems, HIV and Persistent Infections.  Vitals Keith Walker [name]; 10/07/2014 8:44 AM) 10/07/2014 8:44 AM Weight: 216 lb Height: 66in Body Surface Area: 2.14 m Body Mass Index: 34.86 kg/m Temp.: 98.15F(Oral)  Pulse: 89 (Regular)  Resp.: 18 (Unlabored)  BP: 130/70 (Sitting, Left Arm, Standard)     Physical Exam Keith Ruff MD; 6/94/8546 9:19 AM)  General Mental Status-Alert. General Appearance-Consistent with stated age. Hydration-Well hydrated. Voice-Normal.  Head and Neck Head-normocephalic, atraumatic with no lesions or palpable masses. Trachea-midline. Thyroid Gland Characteristics - normal size and consistency.  Eye Eyeball - Bilateral-Extraocular movements intact. Sclera/Conjunctiva - Bilateral-No scleral icterus.  Chest and Lung Exam Chest and lung exam reveals -quiet, even and easy respiratory effort with no use of accessory muscles and on auscultation, normal breath sounds, no adventitious sounds and normal vocal  resonance. Inspection Chest Wall - Normal. Back - normal.  Cardiovascular Cardiovascular examination reveals -normal heart sounds, regular rate and rhythm with no murmurs and normal pedal pulses bilaterally.  Abdomen Inspection Inspection of the abdomen reveals - No Hernias. Palpation/Percussion Palpation and Percussion of the abdomen reveal - Soft, Non Tender, No Rebound tenderness, No Rigidity (guarding) and No hepatosplenomegaly. Auscultation Auscultation of the abdomen reveals - Bowel sounds normal.  Rectal Anorectal Exam External - Note: Small punctate lesion draining purulence and blood at posterior midline. Internal - Note: Normal sphincter tone, no signs of internal opening palpated.  Neurologic Neurologic evaluation reveals -alert and oriented x 3 with no impairment of recent or remote memory. Mental Status-Normal.  Musculoskeletal Global Assessment -Note:no gross deformities.  Normal Exam - Left-Upper Extremity Strength Normal and Lower Extremity Strength Normal. Normal Exam - Right-Upper Extremity Strength Normal and Lower Extremity Strength Normal.    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 I have recommended seton placement to allow for the area to heal and scar. I think that most likely we will be able to remove this in the office after several months. Risk of surgery were discussed with patient. These are mostly bleeding, pain and recurrence.

## 2014-10-10 NOTE — Interval H&P Note (Signed)
History and Physical Interval Note:  10/10/2014 7:16 AM  Keith Walker  has presented today for surgery, with the diagnosis of anal fistula  The various methods of treatment have been discussed with the patient and family. After consideration of risks, benefits and other options for treatment, the patient has consented to  Procedure(s): RECTAL EXAM UNDER ANESTHESIA (N/A) PLACEMENT OF SETON (N/A) as a surgical intervention .  The patient's history has been reviewed, patient examined, no change in status, stable for surgery.  I have reviewed the patient's chart and labs.  Questions were answered to the patient's satisfaction.     Vanita Panda, MD  Colorectal and General Surgery [facility]

## 2014-10-10 NOTE — Anesthesia Postprocedure Evaluation (Signed)
  Anesthesia Post-op Note  Patient: Engineer, maintenance (IT)  Procedure(s) Performed: Procedure(s): RECTAL EXAM UNDER ANESTHESIA (N/A) PLACEMENT OF SETON (N/A)  Patient Location: PACU  Anesthesia Type:MAC  Level of Consciousness: awake, alert , oriented and patient cooperative  Airway and Oxygen Therapy: Patient Spontanous Breathing  Post-op Pain: mild  Post-op Assessment: Post-op Vital signs reviewed, Patient's Cardiovascular Status Stable, Respiratory Function Stable, Patent Airway, No signs of Nausea or vomiting and Pain level controlled              Post-op Vital Signs: Reviewed and stable  Last Vitals:  Filed Vitals:   10/10/14 0842  BP: 120/74  Pulse: 79  Temp: 36.5 C  Resp: 16    Complications: No apparent anesthesia complications

## 2014-10-10 NOTE — Transfer of Care (Signed)
Immediate Anesthesia Transfer of Care Note  Patient: Engineer, maintenance (IT)  Procedure(s) Performed: Procedure(s): RECTAL EXAM UNDER ANESTHESIA (N/A) PLACEMENT OF SETON (N/A)  Patient Location: PACU  Anesthesia Type:MAC  Level of Consciousness: awake, sedated and patient cooperative  Airway & Oxygen Therapy: Patient Spontanous Breathing and Patient connected to nasal cannula oxygen  Post-op Assessment: Report given to RN and Post -op Vital signs reviewed and stable  Post vital signs: Reviewed and stable  Last Vitals:  Filed Vitals:   10/10/14 0535  BP: 130/80  Pulse: 90  Temp: 36.5 C  Resp: 16    Complications: No apparent anesthesia complications

## 2014-10-10 NOTE — Anesthesia Preprocedure Evaluation (Addendum)
Anesthesia Evaluation  Patient identified by MRN, date of birth, ID band Patient awake    Reviewed: Allergy & Precautions, NPO status , Patient's Chart, lab work & pertinent test results  History of Anesthesia Complications Negative for: history of anesthetic complications  Airway Mallampati: I  TM Distance: >3 FB Neck ROM: Full    Dental  (+) Dental Advisory Given   Pulmonary sleep apnea (no CPAP) , former smoker,  breath sounds clear to auscultation        Cardiovascular negative cardio ROS  Rhythm:Regular Rate:Normal     Neuro/Psych PSYCHIATRIC DISORDERS (ADHD) negative neurological ROS     GI/Hepatic Neg liver ROS, GERD-  Medicated and Controlled,  Endo/Other  Morbid obesity  Renal/GU negative Renal ROS     Musculoskeletal  (+) Arthritis -,   Abdominal (+) + obese,   Peds  Hematology negative hematology ROS (+)   Anesthesia Other Findings   Reproductive/Obstetrics                            Anesthesia Physical Anesthesia Plan  ASA: II  Anesthesia Plan: MAC   Post-op Pain Management:    Induction:   Airway Management Planned: Natural Airway and Simple Face Mask  Additional Equipment:   Intra-op Plan:   Post-operative Plan:   Informed Consent: I have reviewed the patients History and Physical, chart, labs and discussed the procedure including the risks, benefits and alternatives for the proposed anesthesia with the patient or authorized representative who has indicated his/her understanding and acceptance.   Dental advisory given  Plan Discussed with: CRNA and Surgeon  Anesthesia Plan Comments: (Plan routine monitors, MAC)        Anesthesia Quick Evaluation

## 2014-10-10 NOTE — Op Note (Signed)
10/10/2014  8:08 AM  PATIENT:  Keith Walker  29 y.o. male  Patient Care Team: Etta Grandchild, MD as PCP - General (Internal Medicine)  PRE-OPERATIVE DIAGNOSIS:  anal fistula, Inflammatory bowel disease  POST-OPERATIVE DIAGNOSIS:  anal fistula, Inflammatory bowel disease  PROCEDURE: RECTAL EXAM UNDER ANESTHESIA PLACEMENT OF SETON   Surgeon(s): Romie Levee, MD  ASSISTANT: none   ANESTHESIA:   local and MAC  SPECIMEN:  No Specimen  DISPOSITION OF SPECIMEN:  N/A  COUNTS:  YES  PLAN OF CARE: Discharge to home after PACU  PATIENT DISPOSITION:  PACU - hemodynamically stable.  INDICATION: 29 y.o. 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  MAC anesthesia was induced without difficulty. The patient was then positioned in prone jackknife position with buttocks gently taped apart.  The patient was then prepped and draped in usual sterile fashion.  SCDs were noted to be in place prior to the initiation of anesthesia. A surgical timeout was performed indicating the correct patient, procedure, positioning and need for preoperative antibiotics.  A rectal block was performed using Marcaine with epinephrine.    I began with a digital rectal exam.  No abnormalities were noted.  I then placed a Hill-Ferguson anoscope into the anal canal and evaluated this completely.  There was minimal inflammation in the anal canal.  I placed an S shaped fistula probe through the posterior midline external opening.  This easily passed into the anal canal distal to the dentate line.  A vessel loop was brought through the fistula and tied together using a 3-0 ethibond suture.  He tolerated the procedure well  All counts were correct.  He was sent to the PACU in stable condition.

## 2014-10-13 ENCOUNTER — Encounter (HOSPITAL_COMMUNITY): Payer: Self-pay | Admitting: General Surgery

## 2014-10-20 ENCOUNTER — Encounter: Payer: Self-pay | Admitting: Internal Medicine

## 2014-10-21 NOTE — Telephone Encounter (Signed)
I answered the first part of the message via my chart. . I am leaving the 2nd part to your advisement. Below is the copied quote from [facility].   "Also I have been paranoid about this rash cause it goes away in one spot and pops up in another? Any idea what could be causing it? Also did it look like a rash from HIV, I have been worrying myself to death about it."

## 2014-11-10 ENCOUNTER — Ambulatory Visit (INDEPENDENT_AMBULATORY_CARE_PROVIDER_SITE_OTHER): Payer: PRIVATE HEALTH INSURANCE | Admitting: Internal Medicine

## 2014-11-10 VITALS — BP 110/72 | HR 95 | Temp 98.5°F | Resp 16 | Ht 66.0 in | Wt 211.0 lb

## 2014-11-10 DIAGNOSIS — B3742 Candidal balanitis: Secondary | ICD-10-CM

## 2014-11-10 DIAGNOSIS — L301 Dyshidrosis [pompholyx]: Secondary | ICD-10-CM

## 2014-11-10 MED ORDER — TRIAMCINOLONE ACETONIDE 0.5 % EX CREA: 1.0000 "application " | TOPICAL_CREAM | Freq: Three times a day (TID) | CUTANEOUS | Status: DC

## 2014-11-10 NOTE — Patient Instructions (Signed)
Eczema Eczema, also called atopic dermatitis, is a skin disorder that causes inflammation of the skin. It causes a red rash and dry, scaly skin. The skin becomes very itchy. Eczema is generally worse during the cooler winter months and often improves with the warmth of summer. Eczema usually starts showing signs in infancy. Some children outgrow eczema, but it may last through adulthood.  CAUSES  The exact cause of eczema is not known, but it appears to run in families. People with eczema often have a family history of eczema, allergies, asthma, or hay fever. Eczema is not contagious. Flare-ups of the condition may be caused by:   Contact with something you are sensitive or allergic to.   Stress. SIGNS AND SYMPTOMS  Dry, scaly skin.   Red, itchy rash.   Itchiness. This may occur before the skin rash and may be very intense.  DIAGNOSIS  The diagnosis of eczema is usually made based on symptoms and medical history. TREATMENT  Eczema cannot be cured, but symptoms usually can be controlled with treatment and other strategies. A treatment plan might include:  Controlling the itching and scratching.   Use over-the-counter antihistamines as directed for itching. This is especially useful at night when the itching tends to be worse.   Use over-the-counter steroid creams as directed for itching.   Avoid scratching. Scratching makes the rash and itching worse. It may also result in a skin infection (impetigo) due to a break in the skin caused by scratching.   Keeping the skin well moisturized with creams every day. This will seal in moisture and help prevent dryness. Lotions that contain alcohol and water should be avoided because they can dry the skin.   Limiting exposure to things that you are sensitive or allergic to (allergens).   Recognizing situations that cause stress.   Developing a plan to manage stress.  HOME CARE INSTRUCTIONS   Only take over-the-counter or  prescription medicines as directed by your health care provider.   Do not use anything on the skin without checking with your health care provider.   Keep baths or showers short (5 minutes) in warm (not hot) water. Use mild cleansers for bathing. These should be unscented. You may add nonperfumed bath oil to the bath water. It is best to avoid soap and bubble bath.   Immediately after a bath or shower, when the skin is still damp, apply a moisturizing ointment to the entire body. This ointment should be a petroleum ointment. This will seal in moisture and help prevent dryness. The thicker the ointment, the better. These should be unscented.   Keep fingernails cut short. Children with eczema may need to wear soft gloves or mittens at night after applying an ointment.   Dress in clothes made of cotton or cotton blends. Dress lightly, because heat increases itching.   A child with eczema should stay away from anyone with fever blisters or cold sores. The virus that causes fever blisters (herpes simplex) can cause a serious skin infection in children with eczema. SEEK MEDICAL CARE IF:   Your itching interferes with sleep.   Your rash gets worse or is not better within 1 week after starting treatment.   You see pus or soft yellow scabs in the rash area.   You have a fever.   You have a rash flare-up after contact with someone who has fever blisters.  Document Released: 04/08/2000 Document Revised: 01/30/2013 Document Reviewed: 11/12/2012 [employer] Patient Information 2015 [employer]. This information   is not intended to replace advice given to you by your health care provider. Make sure you discuss any questions you have with your health care provider.  

## 2014-11-10 NOTE — Progress Notes (Signed)
Subjective:  Patient ID: Keith LeventhalNickolas Walker, male    DOB: 01/05/1986  Age: 29 y.o. MRN: [id number]  CC: Rash   HPI Keith Walker presents for a follow-up on his rash. He has been using triamcinolone and ketoconazole cream and says the rashes are resolving.  Outpatient Prescriptions Prior to Visit  Medication Sig Dispense Refill  . Adalimumab (HUMIRA PEN) 40 MG/0.8ML PNKT Inject 40 mg into the muscle every 14 (fourteen) days. Patient adminsters IM himself    . atomoxetine (STRATTERA) 80 MG capsule Take 1 capsule (80 mg total) by mouth daily. 30 capsule 11  . emtricitabine-tenofovir (TRUVADA) 200-300 MG per tablet Take 1 tablet by mouth daily. 90 tablet 1  . ketoconazole (NIZORAL) 2 % cream Apply 1 application topically 2 (two) times daily. 60 g 11  . LEVITRA 10 MG tablet Take 1 tablet by mouth as needed for erectile dysfunction.   11  . lidocaine (XYLOCAINE) 5 % ointment Apply 1 application topically as needed. 35.44 g 0  . mesalamine (LIALDA) 1.2 G EC tablet Take 4 tablets (4.8 g total) by mouth daily with breakfast. (Patient taking differently: Take 2.4 g by mouth daily with breakfast. ) 120 tablet 11  . naproxen sodium (ANAPROX) 220 MG tablet Take 440 mg by mouth 2 (two) times daily as needed (pain).    . pantoprazole (PROTONIX) 40 MG tablet Take 1 tablet (40 mg total) by mouth daily. 30 tablet 5  . pregabalin (LYRICA) 75 MG capsule Take 75 mg by mouth 2 (two) times daily.    . Testosterone (ANDROGEL PUMP) 20.25 MG/ACT (1.62%) GEL Place 2 Act onto the skin daily. (Patient taking differently: Place 2 Squirts onto the skin daily. ) 75 g 5  . traZODone (DESYREL) 100 MG tablet Take 3 tablets (300 mg total) by mouth at bedtime. 90 tablet 5  . ALPRAZolam (XANAX) 1 MG tablet Take 1 tablet (1 mg total) by mouth 3 (three) times daily as needed for anxiety or sleep. 60 tablet 1  . triamcinolone cream (KENALOG) 0.5 % Apply 1 application topically 3 (three) times daily. 30 g 2  . varenicline  (CHANTIX CONTINUING MONTH PAK) 1 MG tablet Take 1 tablet (1 mg total) by mouth 2 (two) times daily. (Patient not taking: Reported on 11/10/2014) 60 tablet 3   No facility-administered medications prior to visit.    ROS Review of Systems  Constitutional: Negative.  Negative for fever, chills, diaphoresis, appetite change and fatigue.  HENT: Negative.   Eyes: Negative.   Respiratory: Negative.  Negative for cough, choking, chest tightness, shortness of breath and stridor.   Cardiovascular: Negative.  Negative for chest pain, palpitations and leg swelling.  Gastrointestinal: Negative.  Negative for nausea, vomiting, abdominal pain, diarrhea, constipation and blood in stool.  Endocrine: Negative.   Genitourinary: Negative.   Musculoskeletal: Negative.  Negative for myalgias, back pain, joint swelling and arthralgias.  Skin: Positive for rash.  Allergic/Immunologic: Negative.   Neurological: Negative.   Hematological: Negative.  Negative for adenopathy. Does not bruise/bleed easily.  Psychiatric/Behavioral: Negative.     Objective:  BP 110/72 mmHg  Pulse 95  Temp(Src) 98.5 F (36.9 C) (Oral)  Resp 16  Ht 5\' 6"  (1.676 m)  Wt 211 lb (95.709 kg)  BMI 34.07 kg/m2  SpO2 98%  BP Readings from Last 3 Encounters:  11/10/14 110/72  10/10/14 132/78  10/09/14 110/90    Wt Readings from Last 3 Encounters:  11/10/14 211 lb (95.709 kg)  10/10/14 214 lb (97.07 kg)  10/09/14 214 lb (97.07 kg)    Physical Exam  Constitutional: He is oriented to person, place, and time. No distress.  HENT:  Mouth/Throat: Oropharynx is clear and moist. No oropharyngeal exudate.  Eyes: Conjunctivae are normal. Right eye exhibits no discharge. Left eye exhibits no discharge. No scleral icterus.  Neck: Normal range of motion. Neck supple. No JVD present. No tracheal deviation present. No thyromegaly present.  Cardiovascular: Normal rate, regular rhythm, normal heart sounds and intact distal pulses.  Exam  reveals no gallop and no friction rub.   No murmur heard. Pulmonary/Chest: Effort normal and breath sounds normal. No stridor. No respiratory distress. He has no wheezes. He has no rales. He exhibits no tenderness.  Abdominal: Soft. Bowel sounds are normal. He exhibits no distension and no mass. There is no tenderness. There is no rebound and no guarding.  Musculoskeletal: Normal range of motion. He exhibits no edema or tenderness.  Lymphadenopathy:    He has no cervical adenopathy.  Neurological: He is oriented to person, place, and time.  Skin: Skin is warm and dry. Rash noted. No purpura noted. Rash is not macular, not papular, not maculopapular, not nodular, not pustular, not vesicular and not urticarial. He is not diaphoretic. No erythema. No pallor.  There is faint peeling of the skin on the plantar side of his right foot. There is no erythema, scaling, papules or excoriations.  Psychiatric: He has a normal mood and affect. His behavior is normal. Judgment and thought content normal.    Lab Results  Component Value Date   WBC 6.1 10/09/2014   HGB 17.2* 10/09/2014   HCT 49.3 10/09/2014   PLT 205 10/09/2014   GLUCOSE 88 10/09/2014   CHOL 167 02/20/2012   TRIG 163* 02/20/2012   HDL 43 02/20/2012   LDLCALC 91 02/20/2012   ALT 20 09/10/2014   AST 20 09/10/2014   NA 136 10/09/2014   K 4.0 10/09/2014   CL 100* 10/09/2014   CREATININE 0.97 10/09/2014   BUN 13 10/09/2014   CO2 26 10/09/2014   TSH 1.50 06/12/2014    No results found.  Assessment & Plan:   Keith Walker was seen today for rash.  Diagnoses and all orders for this visit:  Eczema, dyshidrotic- improvement noted with triamcinolone cream. Orders: -     triamcinolone cream (KENALOG) 0.5 %; Apply 1 application topically 3 (three) times daily.  Candidal balanitis- he will continue using ketoconazole as needed.   I have discontinued Keith Walker's ALPRAZolam and varenicline. I am also having him maintain his  pregabalin, Adalimumab, ketoconazole, emtricitabine-tenofovir, atomoxetine, traZODone, Testosterone, mesalamine, pantoprazole, LEVITRA, naproxen sodium, lidocaine, and triamcinolone cream.  Meds ordered this encounter  Medications  . triamcinolone cream (KENALOG) 0.5 %    Sig: Apply 1 application topically 3 (three) times daily.    Dispense:  30 g    Refill:  1     Follow-up: Return in about 4 months (around 03/13/2015).  Sanda Linger, MD

## 2014-11-11 ENCOUNTER — Encounter: Payer: Self-pay | Admitting: Internal Medicine

## 2014-11-26 ENCOUNTER — Other Ambulatory Visit: Payer: Self-pay | Admitting: Internal Medicine

## 2014-11-26 ENCOUNTER — Encounter: Payer: Self-pay | Admitting: Internal Medicine

## 2014-11-26 DIAGNOSIS — J45909 Unspecified asthma, uncomplicated: Secondary | ICD-10-CM

## 2014-11-26 MED ORDER — ALBUTEROL SULFATE 108 (90 BASE) MCG/ACT IN AEPB: 1.0000 | INHALATION_SPRAY | Freq: Four times a day (QID) | RESPIRATORY_TRACT | Status: DC | PRN

## 2014-11-26 MED ORDER — AMOXICILLIN 875 MG PO TABS: 875.0000 mg | ORAL_TABLET | Freq: Two times a day (BID) | ORAL | Status: DC

## 2015-01-01 ENCOUNTER — Encounter: Payer: Self-pay | Admitting: Internal Medicine

## 2015-01-01 ENCOUNTER — Other Ambulatory Visit: Payer: Self-pay | Admitting: Internal Medicine

## 2015-01-01 DIAGNOSIS — J45909 Unspecified asthma, uncomplicated: Secondary | ICD-10-CM

## 2015-01-01 MED ORDER — BECLOMETHASONE DIPROPIONATE 40 MCG/ACT IN AERS
1.0000 | INHALATION_SPRAY | Freq: Two times a day (BID) | RESPIRATORY_TRACT | Status: DC
Start: 2015-01-01 — End: 2016-01-28

## 2015-01-23 ENCOUNTER — Other Ambulatory Visit: Payer: Self-pay | Admitting: Internal Medicine

## 2015-01-23 MED ORDER — ALPRAZOLAM 1 MG PO TABS: 1.0000 mg | ORAL_TABLET | Freq: Two times a day (BID) | ORAL | Status: DC | PRN

## 2015-01-23 NOTE — Telephone Encounter (Signed)
1 month supply of medication provided. Additional refills per PCP.
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
## 2015-01-23 NOTE — Telephone Encounter (Signed)
[facility] [address] Has been trying to [facility] a request in for ALPRAZolam Prudy Feeler) 1 MG tablet [[id number]] DISCONTINUED

## 2015-01-30 ENCOUNTER — Telehealth: Payer: Self-pay | Admitting: *Deleted

## 2015-01-30 DIAGNOSIS — Z299 Encounter for prophylactic measures, unspecified: Secondary | ICD-10-CM

## 2015-01-30 MED ORDER — EMTRICITABINE-TENOFOVIR DF 200-300 MG PO TABS: 1.0000 | ORAL_TABLET | Freq: Every day | ORAL | Status: DC
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
## 2015-02-25 ENCOUNTER — Encounter: Payer: Self-pay | Admitting: Internal Medicine

## 2015-02-25 ENCOUNTER — Ambulatory Visit (INDEPENDENT_AMBULATORY_CARE_PROVIDER_SITE_OTHER)
Admission: RE | Admit: 2015-02-25 | Discharge: 2015-02-25 | Disposition: A | Discharge status: Home / Self Care | Payer: PRIVATE HEALTH INSURANCE | Source: Ambulatory Visit | Attending: Internal Medicine | Admitting: Internal Medicine

## 2015-02-25 ENCOUNTER — Ambulatory Visit (INDEPENDENT_AMBULATORY_CARE_PROVIDER_SITE_OTHER): Payer: PRIVATE HEALTH INSURANCE | Admitting: Internal Medicine

## 2015-02-25 VITALS — BP 118/80 | HR 99 | Temp 98.4°F | Resp 16 | Ht 66.0 in | Wt 215.0 lb

## 2015-02-25 DIAGNOSIS — J45909 Unspecified asthma, uncomplicated: Secondary | ICD-10-CM | POA: Diagnosis not present

## 2015-02-25 DIAGNOSIS — R059 Cough, unspecified: Secondary | ICD-10-CM | POA: Insufficient documentation

## 2015-02-25 DIAGNOSIS — J209 Acute bronchitis, unspecified: Secondary | ICD-10-CM

## 2015-02-25 DIAGNOSIS — R05 Cough: Secondary | ICD-10-CM | POA: Diagnosis not present

## 2015-02-25 MED ORDER — MONTELUKAST SODIUM 10 MG PO TABS: 10.0000 mg | ORAL_TABLET | Freq: Every day | ORAL | Status: DC

## 2015-02-25 MED ORDER — ALPRAZOLAM 1 MG PO TABS: 1.0000 mg | ORAL_TABLET | Freq: Two times a day (BID) | ORAL | Status: DC | PRN

## 2015-02-25 MED ORDER — AMOXICILLIN-POT CLAVULANATE 875-125 MG PO TABS: 1.0000 | ORAL_TABLET | Freq: Two times a day (BID) | ORAL | Status: AC

## 2015-02-25 NOTE — Progress Notes (Signed)
Subjective:  Patient ID: Keith LeventhalNickolas Walker, male    DOB: 03/17/1986  Age: 29 y.o. MRN: [id number]  CC: Cough   HPI Keith Walker presents for a recurrent cough for several weeks. He also complains of worsening allergies with runny nose, sneezing, nasal congestion and wheezing. The cough is productive of yellow phlegm and he has had some chills but no fever or night sweats.  Outpatient Prescriptions Prior to Visit  Medication Sig Dispense Refill  . Adalimumab (HUMIRA PEN) 40 MG/0.8ML PNKT Inject 40 mg into the muscle every 14 (fourteen) days. Patient adminsters IM himself    . Albuterol Sulfate (PROAIR RESPICLICK) 108 (90 BASE) MCG/ACT AEPB Inhale 1 puff into the lungs 4 (four) times daily as needed. 1 each 11  . atomoxetine (STRATTERA) 80 MG capsule Take 1 capsule (80 mg total) by mouth daily. 30 capsule 11  . beclomethasone (QVAR) 40 MCG/ACT inhaler Inhale 1 puff into the lungs 2 (two) times daily. 1 Inhaler 12  . emtricitabine-tenofovir (TRUVADA) 200-300 MG tablet Take 1 tablet by mouth daily. 30 tablet 0  . ketoconazole (NIZORAL) 2 % cream Apply 1 application topically 2 (two) times daily. 60 g 11  . LEVITRA 10 MG tablet Take 1 tablet by mouth as needed for erectile dysfunction.   11  . lidocaine (XYLOCAINE) 5 % ointment Apply 1 application topically as needed. 35.44 g 0  . mesalamine (LIALDA) 1.2 G EC tablet Take 4 tablets (4.8 g total) by mouth daily with breakfast. (Patient taking differently: Take 2.4 g by mouth daily with breakfast. ) 120 tablet 11  . naproxen sodium (ANAPROX) 220 MG tablet Take 440 mg by mouth 2 (two) times daily as needed (pain).    . pantoprazole (PROTONIX) 40 MG tablet Take 1 tablet (40 mg total) by mouth daily. 30 tablet 5  . pregabalin (LYRICA) 75 MG capsule Take 75 mg by mouth 2 (two) times daily.    . Testosterone (ANDROGEL PUMP) 20.25 MG/ACT (1.62%) GEL Place 2 Act onto the skin daily. (Patient taking differently: Place 2 Squirts onto the skin daily.  ) 75 g 5  . traZODone (DESYREL) 100 MG tablet Take 3 tablets (300 mg total) by mouth at bedtime. 90 tablet 5  . triamcinolone cream (KENALOG) 0.5 % Apply 1 application topically 3 (three) times daily. 30 g 1  . ALPRAZolam (XANAX) 1 MG tablet Take 1 tablet (1 mg total) by mouth 2 (two) times daily as needed for anxiety or sleep. 60 tablet 0   No facility-administered medications prior to visit.    ROS Review of Systems  Constitutional: Positive for chills. Negative for fever, diaphoresis, activity change, appetite change, fatigue and unexpected weight change.  HENT: Positive for congestion, postnasal drip, rhinorrhea and sneezing. Negative for ear pain, facial swelling, nosebleeds, sinus pressure, sore throat and trouble swallowing.   Eyes: Negative.   Respiratory: Positive for cough. Negative for apnea, choking, chest tightness, shortness of breath, wheezing and stridor.   Cardiovascular: Negative.  Negative for chest pain, palpitations and leg swelling.  Gastrointestinal: Negative.  Negative for nausea, vomiting, abdominal pain, diarrhea, constipation and blood in stool.  Endocrine: Negative.   Genitourinary: Negative.   Musculoskeletal: Negative.  Negative for myalgias and arthralgias.  Skin: Negative.  Negative for color change and rash.  Allergic/Immunologic: Negative.   Neurological: Negative.   Hematological: Negative.  Negative for adenopathy. Does not bruise/bleed easily.  Psychiatric/Behavioral: Negative for suicidal ideas, behavioral problems, sleep disturbance and dysphoric mood. The patient is nervous/anxious.  Objective:  BP 118/80 mmHg  Pulse 99  Temp(Src) 98.4 F (36.9 C) (Oral)  Resp 16  Ht  (1.676 m)  Wt 215 lb (97.523 kg)  BMI 34.72 kg/m2  SpO2 98%  BP Readings from Last 3 Encounters:  02/25/15 118/80  11/10/14 110/72  10/10/14 132/78    Wt Readings from Last 3 Encounters:  02/25/15 215 lb (97.523 kg)  11/10/14 211 lb (95.709 kg)  10/10/14 214  lb (97.07 kg)    Physical Exam  Constitutional: He is oriented to person, place, and time. He appears well-developed and well-nourished. No distress.  HENT:  Head: Normocephalic and atraumatic.  Right Ear: Hearing, tympanic membrane, external ear and ear canal normal.  Left Ear: Hearing, tympanic membrane, external ear and ear canal normal.  Nose: Mucosal edema and rhinorrhea present. No sinus tenderness. No epistaxis. Right sinus exhibits no maxillary sinus tenderness and no frontal sinus tenderness. Left sinus exhibits no maxillary sinus tenderness and no frontal sinus tenderness.  Mouth/Throat: Oropharynx is clear and moist and mucous membranes are normal. Mucous membranes are not pale, not dry and not cyanotic. No oropharyngeal exudate, posterior oropharyngeal edema, posterior oropharyngeal erythema or tonsillar abscesses.  Eyes: Conjunctivae are normal. Right eye exhibits no discharge. Left eye exhibits no discharge. No scleral icterus.  Neck: Normal range of motion. Neck supple. No JVD present. No tracheal deviation present. No thyromegaly present.  Cardiovascular: Normal rate, regular rhythm, normal heart sounds and intact distal pulses.  Exam reveals no gallop and no friction rub.   No murmur heard. Pulmonary/Chest: Effort normal and breath sounds normal. No stridor. No respiratory distress. He has no wheezes. He has no rales. He exhibits no tenderness.  Abdominal: Soft. Bowel sounds are normal. He exhibits no distension and no mass. There is no tenderness. There is no rebound and no guarding.  Musculoskeletal: Normal range of motion. He exhibits no edema or tenderness.  Lymphadenopathy:    He has no cervical adenopathy.  Neurological: He is oriented to person, place, and time.  Skin: Skin is warm and dry. No rash noted. He is not diaphoretic. No erythema. No pallor.  Vitals reviewed.   Lab Results  Component Value Date   WBC 6.1 10/09/2014   HGB 17.2* 10/09/2014   HCT 49.3  10/09/2014   PLT 205 10/09/2014   GLUCOSE 88 10/09/2014   CHOL 167 02/20/2012   TRIG 163* 02/20/2012   HDL 43 02/20/2012   LDLCALC 91 02/20/2012   ALT 20 09/10/2014   AST 20 09/10/2014   NA 136 10/09/2014   K 4.0 10/09/2014   CL 100* 10/09/2014   CREATININE 0.97 10/09/2014   BUN 13 10/09/2014   CO2 26 10/09/2014   TSH 1.50 06/12/2014   Dg Chest 2 View  02/25/2015  CLINICAL DATA:  Cough. EXAM: CHEST  2 VIEW COMPARISON:  12/14/2014. FINDINGS: Mediastinum and hilar structures normal. Lungs are clear. Heart size normal. No pleural effusion or pneumothorax. No acute bony abnormality. IMPRESSION: No active cardiopulmonary disease. Electronically Signed   By: Maisie Fus  Register   On: 02/25/2015 17:01   No results found.  Assessment & Plan:   Javanni was seen today for cough.  Diagnoses and all orders for this visit:  Asthma due to seasonal allergies- he has persistent asthma symptoms, we'll continue the albuterol inhaler and inhaled steroids, will also add on Singulair for additional symptom relief. -     montelukast (SINGULAIR) 10 MG tablet; Take 1 tablet (10 mg total) by mouth at  bedtime.  Cough- his CXR is normal, will treat for asthma and infection -     DG Chest 2 View; Future  Acute bronchitis, unspecified organism- will treat the infection with augemntin -     amoxicillin-clavulanate (AUGMENTIN) 875-125 MG tablet; Take 1 tablet by mouth 2 (two) times daily.  Other orders -     ALPRAZolam (XANAX) 1 MG tablet; Take 1 tablet (1 mg total) by mouth 2 (two) times daily as needed for anxiety or sleep.   I am having Mr. Cheese start on montelukast and amoxicillin-clavulanate. I am also having him maintain his pregabalin, Adalimumab, ketoconazole, atomoxetine, traZODone, Testosterone, mesalamine, pantoprazole, LEVITRA, naproxen sodium, lidocaine, triamcinolone cream, Albuterol Sulfate, beclomethasone, emtricitabine-tenofovir, and ALPRAZolam.  Meds ordered this encounter    Medications  . montelukast (SINGULAIR) 10 MG tablet    Sig: Take 1 tablet (10 mg total) by mouth at bedtime.    Dispense:  90 tablet    Refill:  3  . ALPRAZolam (XANAX) 1 MG tablet    Sig: Take 1 tablet (1 mg total) by mouth 2 (two) times daily as needed for anxiety or sleep.    Dispense:  60 tablet    Refill:  3  . amoxicillin-clavulanate (AUGMENTIN) 875-125 MG tablet    Sig: Take 1 tablet by mouth 2 (two) times daily.    Dispense:  28 tablet    Refill:  1     Follow-up: Return in about 3 weeks (around 03/18/2015).  Sanda Linger, MD

## 2015-02-25 NOTE — Patient Instructions (Signed)

## 2015-02-25 NOTE — Progress Notes (Signed)
Pre visit review using our clinic review tool, if applicable. No additional management support is needed unless otherwise documented below in the visit note.,  

## 2015-04-28 ENCOUNTER — Other Ambulatory Visit: Payer: Self-pay

## 2015-04-28 DIAGNOSIS — F900 Attention-deficit hyperactivity disorder, predominantly inattentive type: Secondary | ICD-10-CM

## 2015-04-28 MED ORDER — ATOMOXETINE HCL 80 MG PO CAPS: 80.0000 mg | ORAL_CAPSULE | Freq: Every day | ORAL | Status: DC

## 2015-04-28 NOTE — Telephone Encounter (Signed)
[facility] Medication pended for your review.

## 2015-05-13 ENCOUNTER — Encounter: Payer: Self-pay | Admitting: Internal Medicine

## 2015-05-13 ENCOUNTER — Other Ambulatory Visit (INDEPENDENT_AMBULATORY_CARE_PROVIDER_SITE_OTHER): Payer: Managed Care, Other (non HMO)

## 2015-05-13 ENCOUNTER — Ambulatory Visit (INDEPENDENT_AMBULATORY_CARE_PROVIDER_SITE_OTHER): Payer: Managed Care, Other (non HMO) | Admitting: Internal Medicine

## 2015-05-13 ENCOUNTER — Ambulatory Visit (HOSPITAL_COMMUNITY)
Admission: RE | Admit: 2015-05-13 | Discharge: 2015-05-13 | Disposition: A | Discharge status: Home / Self Care | Payer: Managed Care, Other (non HMO) | Source: Ambulatory Visit | Attending: Internal Medicine | Admitting: Internal Medicine

## 2015-05-13 VITALS — BP 120/70 | HR 76 | Ht 66.0 in | Wt 207.0 lb

## 2015-05-13 DIAGNOSIS — R1011 Right upper quadrant pain: Secondary | ICD-10-CM

## 2015-05-13 DIAGNOSIS — K219 Gastro-esophageal reflux disease without esophagitis: Secondary | ICD-10-CM | POA: Diagnosis not present

## 2015-05-13 DIAGNOSIS — R110 Nausea: Secondary | ICD-10-CM

## 2015-05-13 DIAGNOSIS — K50119 Crohn's disease of large intestine with unspecified complications: Secondary | ICD-10-CM

## 2015-05-13 LAB — COMPREHENSIVE METABOLIC PANEL
ALT: 22 U/L (ref 0–53)
AST: 20 U/L (ref 0–37)
Albumin: 4.2 g/dL (ref 3.5–5.2)
Alkaline Phosphatase: 62 U/L (ref 39–117)
BUN: 11 mg/dL (ref 6–23)
CO2: 30 meq/L (ref 19–32)
Calcium: 9.3 mg/dL (ref 8.4–10.5)
Chloride: 103 mEq/L (ref 96–112)
Creatinine, Ser: 0.98 mg/dL (ref 0.40–1.50)
GFR: 96.94 mL/min (ref >60.00)
GLUCOSE: 88 mg/dL (ref 70–99)
POTASSIUM: 4.4 meq/L (ref 3.5–5.1)
SODIUM: 139 meq/L (ref 135–145)
Total Bilirubin: 0.7 mg/dL (ref 0.2–1.2)
Total Protein: 7 g/dL (ref 6.0–8.3)

## 2015-05-13 LAB — CBC WITH DIFFERENTIAL/PLATELET
BASOS PCT: 0.4 % (ref 0.0–3.0)
Basophils Absolute: 0 10*3/uL (ref 0.0–0.1)
EOS ABS: 0.1 10*3/uL (ref 0.0–0.7)
Eosinophils Relative: 2.4 % (ref 0.0–5.0)
HCT: 46.6 % (ref 39.0–52.0)
Hemoglobin: 15.9 g/dL (ref 13.0–17.0)
LYMPHS ABS: 1.6 10*3/uL (ref 0.7–4.0)
Lymphocytes Relative: 32.3 % (ref 12.0–46.0)
MCHC: 34 g/dL (ref 30.0–36.0)
MCV: 97.1 fl (ref 78.0–100.0)
MONO ABS: 0.4 10*3/uL (ref 0.1–1.0)
Monocytes Relative: 8.7 % (ref 3.0–12.0)
NEUTROS PCT: 56.2 % (ref 43.0–77.0)
Neutro Abs: 2.8 10*3/uL (ref 1.4–7.7)
PLATELETS: 213 10*3/uL (ref 150.0–400.0)
RBC: 4.8 Mil/uL (ref 4.22–5.81)
RDW: 12.7 % (ref 11.5–15.5)
WBC: 5 10*3/uL (ref 4.0–10.5)

## 2015-05-13 LAB — LIPASE: LIPASE: 13 U/L (ref 11.0–59.0)

## 2015-05-13 LAB — HIGH SENSITIVITY CRP: CRP, High Sensitivity: 5.93 mg/L — ABNORMAL HIGH (ref 0.000–5.000)

## 2015-05-13 MED ORDER — OXYCODONE-ACETAMINOPHEN 5-325 MG PO TABS: ORAL_TABLET | ORAL | Status: DC

## 2015-05-13 NOTE — Progress Notes (Signed)
Subjective:    Patient ID: Keith Walker, male    DOB: Apr 03, 1986, 30 y.o.   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 He was last seen in May 2016. He's been maintained on Humira for Crohn's and IBD arthropathy. He is worked in for urgent visit today.  He reports having developed right upper quadrant pain which radiates around to his back. This is associated with nausea and hot flashes. It's interfered with sleep. No vomiting. First noticed it after Thanksgiving in a milder fashion which resolved and then symptoms started back in the last 2-3 weeks. It has become more constant and limiting to activity. Doesn't seem to change with eating. Pain does fluctuate. He tried heating pad, a stimulation pad, Flexeril all with no help. Use tramadol which helped a little. No response to NSAIDs. Hurts to hit a bump in the car. Bowel movements occurring 3-5 times a day and are soft but mostly formed. Not bloody and not pure diarrhea. Doesn't feel exactly like colitis flare to him. His 14 days overdue on Humira due to an insurance change and Dr. Dierdre Forth is working on this for him. Joint pains overall have been dramatically better and no further oral or genital ulceration on Humira.   He is now single. Reports work is stressful.  He is taking mesalamine 2.4 g daily. He has mostly avoided NSAIDs except trying them for this pain. He is using Zantac 150 each morning controlling GERD better than previously prescribed pantoprazole. No dysphagia. No odynophagia.   Review of Systems As per history of present illness, otherwise negative  Current Medications, Allergies, Past Medical History, Past Surgical History, Family History and Social History were reviewed in [employer] record.     Objective:   Physical Exam BP  120/70 mmHg  Pulse 76  Ht  (1.676 m)  Wt 207 lb (93.895 kg)  BMI 33.43 kg/m2 Constitutional: Well-developed and well-nourished. No distress. HEENT: Normocephalic and atraumatic. Oropharynx is clear and moist. No oropharyngeal exudate. Conjunctivae are normal.  No scleral icterus. Neck: Neck supple. Trachea midline. Cardiovascular: Normal rate, regular rhythm and intact distal pulses. No M/R/G Pulmonary/chest: Effort normal and breath sounds normal. No wheezing, rales or rhonchi. Abdominal: Soft, moderate to severe point tenderness over the gallbladder and will right upper quadrant, there is no rebound or guarding, nondistended. Bowel sounds active throughout.  Extremities: no clubbing, cyanosis, or edema Neurological: Alert and oriented to person place and time. Skin: Skin is warm and dry. No rashes noted. Psychiatric: Normal mood and affect. Behavior is normal.  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  1. RUQ pain -- significant abdominal pain for him, unclear etiology. Cannot exclude gallbladder inflammation or Crohn's colitis flare in the right colon or hepatic flexure. I'm going to start with labs and an abdominal ultrasound to rule out cholecystitis/gallstones. CBC, CMP, lipase, CRP. He requested routine check for HIV and RPR. Prescription for oxycodone 5/325 one to 2 tablets every 6 hours as needed for severe pain. If this ultrasound is unrevealing will likely need cross-sectional imaging either with CT or MR enterography to evaluate for active Crohn's disease.  2. Crohn's colitis with inflammatory arthropathy --  Humira has been working well previously though he's been off for 14 days as insurance/financial issues are worked out. 2 sample pens of Humira 40 mg given today. He can resume the dose  immediately. Continue 40 mg every 14 days. Continue Lialda 2.4 g daily --He has follow-up with Dr. Dierdre Forth today  3. GERD -- responding better to ranitidine and pantoprazole. Discontinue pantoprazole. Continue ranitidine 150 mg once to twice daily  6 month follow-up for issue #2, sooner if necessary though we will continue to work through his current RUQ pain 25 minutes spent with the patient today. Greater than 50% was spent in counseling and coordination of care with the patient

## 2015-05-13 NOTE — Patient Instructions (Addendum)
You have been given a separate informational sheet regarding your tobacco use, the importance of quitting and local resources to help you quit.   Your physician has requested that you go to the basement for the following lab work before leaving today: CBC, CMP, Lipase, HIV, CRP, RPR  We have given you a prescription of oxycodone to take to your pharmacy.   You have been scheduled for an abdominal ultrasound at [facility] (1st floor of hospital) on TODAY at 2:00 pm. Please arrive 15 minutes prior to your appointment for registration. Make certain not to have anything to eat or drink 6 hours prior to your appointment. Should you need to reschedule your appointment, please contact radiology at [phone]. This test typically takes about 30 minutes to perform.

## 2015-05-14 ENCOUNTER — Other Ambulatory Visit: Payer: Self-pay

## 2015-05-14 DIAGNOSIS — R1011 Right upper quadrant pain: Secondary | ICD-10-CM

## 2015-05-14 DIAGNOSIS — K50919 Crohn's disease, unspecified, with unspecified complications: Secondary | ICD-10-CM

## 2015-05-14 LAB — HIV ANTIBODY (ROUTINE TESTING W REFLEX): HIV: NONREACTIVE

## 2015-05-14 LAB — RPR

## 2015-05-21 ENCOUNTER — Encounter: Payer: Self-pay | Admitting: Internal Medicine

## 2015-05-22 ENCOUNTER — Other Ambulatory Visit: Payer: Self-pay | Admitting: Internal Medicine

## 2015-05-22 DIAGNOSIS — Z299 Encounter for prophylactic measures, unspecified: Secondary | ICD-10-CM

## 2015-05-22 MED ORDER — EMTRICITABINE-TENOFOVIR DF 200-300 MG PO TABS: 1.0000 | ORAL_TABLET | Freq: Every day | ORAL | Status: DC

## 2015-05-29 ENCOUNTER — Ambulatory Visit (HOSPITAL_COMMUNITY): Payer: Managed Care, Other (non HMO)

## 2015-06-02 ENCOUNTER — Ambulatory Visit (HOSPITAL_COMMUNITY)
Admission: RE | Admit: 2015-06-02 | Discharge: 2015-06-02 | Disposition: A | Discharge status: Home / Self Care | Payer: Managed Care, Other (non HMO) | Source: Ambulatory Visit | Attending: Internal Medicine | Admitting: Internal Medicine

## 2015-06-02 ENCOUNTER — Other Ambulatory Visit: Payer: Self-pay | Admitting: Internal Medicine

## 2015-06-02 DIAGNOSIS — R1011 Right upper quadrant pain: Secondary | ICD-10-CM

## 2015-06-02 DIAGNOSIS — K50919 Crohn's disease, unspecified, with unspecified complications: Secondary | ICD-10-CM

## 2015-06-02 MED ORDER — GADOBENATE DIMEGLUMINE 529 MG/ML IV SOLN
20.0000 mL | Freq: Once | INTRAVENOUS | Status: AC | PRN
  Administered 2015-06-02: 20 mL via INTRAVENOUS

## 2015-09-29 ENCOUNTER — Telehealth: Payer: Self-pay | Admitting: Internal Medicine

## 2015-09-29 NOTE — Telephone Encounter (Signed)
yes

## 2015-09-29 NOTE — Telephone Encounter (Signed)
Patient states he is leaving for [place] on Friday.  He states there is a mumps outbreak.  He is not sure if he ever got MMR.  He would like to know if he would be able to get injection?

## 2015-09-30 NOTE — Telephone Encounter (Signed)
Patient will call back to schedule ?

## 2015-10-30 ENCOUNTER — Other Ambulatory Visit: Payer: Self-pay | Admitting: Internal Medicine

## 2015-11-03 ENCOUNTER — Encounter: Payer: Self-pay | Admitting: Internal Medicine

## 2015-11-05 MED ORDER — EMTRICITABINE-TENOFOVIR DF 200-300 MG PO TABS: 1.0000 | ORAL_TABLET | Freq: Every day | ORAL | Status: DC

## 2015-12-07 ENCOUNTER — Other Ambulatory Visit: Payer: Self-pay | Admitting: Internal Medicine

## 2015-12-07 ENCOUNTER — Encounter: Payer: Self-pay | Admitting: Internal Medicine

## 2015-12-07 MED ORDER — ALPRAZOLAM 1 MG PO TABS: 1.0000 mg | ORAL_TABLET | Freq: Two times a day (BID) | ORAL | 0 refills | Status: DC | PRN

## 2015-12-07 NOTE — Telephone Encounter (Signed)
[facility]...Shearon Stalls/lb

## 2016-01-28 ENCOUNTER — Other Ambulatory Visit (INDEPENDENT_AMBULATORY_CARE_PROVIDER_SITE_OTHER): Payer: 59

## 2016-01-28 ENCOUNTER — Ambulatory Visit (INDEPENDENT_AMBULATORY_CARE_PROVIDER_SITE_OTHER): Payer: 59 | Admitting: Internal Medicine

## 2016-01-28 ENCOUNTER — Encounter: Payer: Self-pay | Admitting: Internal Medicine

## 2016-01-28 ENCOUNTER — Other Ambulatory Visit: Payer: Self-pay | Admitting: Internal Medicine

## 2016-01-28 VITALS — BP 128/86 | HR 90 | Temp 98.8°F | Resp 16 | Ht 66.0 in | Wt 195.8 lb

## 2016-01-28 DIAGNOSIS — Z113 Encounter for screening for infections with a predominantly sexual mode of transmission: Secondary | ICD-10-CM | POA: Diagnosis not present

## 2016-01-28 DIAGNOSIS — Z202 Contact with and (suspected) exposure to infections with a predominantly sexual mode of transmission: Secondary | ICD-10-CM

## 2016-01-28 DIAGNOSIS — Z7251 High risk heterosexual behavior: Secondary | ICD-10-CM

## 2016-01-28 DIAGNOSIS — Z23 Encounter for immunization: Secondary | ICD-10-CM | POA: Diagnosis not present

## 2016-01-28 DIAGNOSIS — F900 Attention-deficit hyperactivity disorder, predominantly inattentive type: Secondary | ICD-10-CM

## 2016-01-28 DIAGNOSIS — F418 Other specified anxiety disorders: Secondary | ICD-10-CM

## 2016-01-28 DIAGNOSIS — A510 Primary genital syphilis: Secondary | ICD-10-CM

## 2016-01-28 DIAGNOSIS — Z299 Encounter for prophylactic measures, unspecified: Secondary | ICD-10-CM

## 2016-01-28 LAB — URINALYSIS, ROUTINE W REFLEX MICROSCOPIC
Bilirubin Urine: NEGATIVE
Hgb urine dipstick: NEGATIVE
KETONES UR: NEGATIVE
Leukocytes, UA: NEGATIVE
Nitrite: NEGATIVE
PH: 7 (ref 5.0–8.0)
RBC / HPF: NONE SEEN (ref 0–?)
Total Protein, Urine: NEGATIVE
URINE GLUCOSE: NEGATIVE
UROBILINOGEN UA: 0.2 (ref 0.0–1.0)
WBC, UA: NONE SEEN (ref 0–?)

## 2016-01-28 LAB — CBC WITH DIFFERENTIAL/PLATELET
BASOS ABS: 0 10*3/uL (ref 0.0–0.1)
Basophils Relative: 0.3 % (ref 0.0–3.0)
Eosinophils Absolute: 0.1 10*3/uL (ref 0.0–0.7)
Eosinophils Relative: 1.1 % (ref 0.0–5.0)
HEMATOCRIT: 44.1 % (ref 39.0–52.0)
Hemoglobin: 15.4 g/dL (ref 13.0–17.0)
LYMPHS PCT: 30.1 % (ref 12.0–46.0)
Lymphs Abs: 1.8 10*3/uL (ref 0.7–4.0)
MCHC: 34.9 g/dL (ref 30.0–36.0)
MCV: 91.9 fl (ref 78.0–100.0)
MONOS PCT: 12.8 % — AB (ref 3.0–12.0)
Monocytes Absolute: 0.8 10*3/uL (ref 0.1–1.0)
Neutro Abs: 3.3 10*3/uL (ref 1.4–7.7)
Neutrophils Relative %: 55.7 % (ref 43.0–77.0)
Platelets: 293 10*3/uL (ref 150.0–400.0)
RBC: 4.8 Mil/uL (ref 4.22–5.81)
RDW: 13.1 % (ref 11.5–15.5)
WBC: 5.9 10*3/uL (ref 4.0–10.5)

## 2016-01-28 MED ORDER — AMPHETAMINE-DEXTROAMPHETAMINE 20 MG PO TABS
20.0000 mg | ORAL_TABLET | Freq: Every day | ORAL | 0 refills | Status: DC
Start: 2016-01-28 — End: 2016-01-28

## 2016-01-28 MED ORDER — AMPHETAMINE-DEXTROAMPHETAMINE 20 MG PO TABS: 20.0000 mg | ORAL_TABLET | Freq: Every day | ORAL | 0 refills | Status: DC

## 2016-01-28 MED ORDER — SERTRALINE HCL 50 MG PO TABS: 50.0000 mg | ORAL_TABLET | Freq: Every day | ORAL | 5 refills | Status: DC

## 2016-01-28 MED ORDER — ALPRAZOLAM 1 MG PO TABS: 1.0000 mg | ORAL_TABLET | Freq: Two times a day (BID) | ORAL | 3 refills | Status: DC | PRN

## 2016-01-28 NOTE — Progress Notes (Signed)
Subjective:  Patient ID: Keith Walker, male    DOB: [birth year]  Age: 30 y.o. MRN: [id number]  CC: ADHD   HPI Keith Walker presents for follow-up on ADHD. He complains of a lack of focus, poor attention, and difficulty completing tasks. He says his symptoms are so severe that it makes him feel nervous and anxious. He tells me the Xanax helps with anxiety and panic but does not help with the ADHD. I previously prescribed Strattera but he told me it's too expensive for him to take. He said he has tried a friend's supply of Adderall and said it helped him quite a bit, he says it made him feel more alert and focused and productive and did not cause any side effects.  He is also due for follow-up on high risk sexual behavior. He continues to have multiple sexual partners and practices unsafe sex. He denies any recent episodes of penile lesions or ulcers, lymphadenopathy, rash, sore throat, fever, chills, anorectal pain, abdominal pain, or diarrhea. He tells me that one of his recent sexual partners told him that he had genital herpes. The patient wants to be tested for genital herpes.  Outpatient Medications Prior to Visit  Medication Sig Dispense Refill  . Adalimumab (HUMIRA PEN) 40 MG/0.8ML PNKT Inject 40 mg into the muscle every 14 (fourteen) days. Patient adminsters IM himself    . emtricitabine-tenofovir (TRUVADA) 200-300 MG tablet Take 1 tablet by mouth daily. 90 tablet 1  . LEVITRA 10 MG tablet Take 1 tablet by mouth as needed for erectile dysfunction.   11  . mesalamine (LIALDA) 1.2 G EC tablet Take 4 tablets (4.8 g total) by mouth daily with breakfast. (Patient taking differently: Take 2.4 g by mouth daily with breakfast. ) 120 tablet 11  . naproxen sodium (ANAPROX) 220 MG tablet Take 440 mg by mouth 2 (two) times daily as needed (pain).    Marland Kitchen ALPRAZolam (XANAX) 1 MG tablet Take 1 tablet (1 mg total) by mouth 2 (two) times daily as needed for anxiety or sleep. 60 tablet 0  .  Albuterol Sulfate (PROAIR RESPICLICK) 108 (90 BASE) MCG/ACT AEPB Inhale 1 puff into the lungs 4 (four) times daily as needed. 1 each 11  . atomoxetine (STRATTERA) 80 MG capsule Take 1 capsule (80 mg total) by mouth daily. 90 capsule 3  . beclomethasone (QVAR) 40 MCG/ACT inhaler Inhale 1 puff into the lungs 2 (two) times daily. 1 Inhaler 12  . ketoconazole (NIZORAL) 2 % cream Apply 1 application topically 2 (two) times daily. 60 g 11  . lidocaine (XYLOCAINE) 5 % ointment Apply 1 application topically as needed. 35.44 g 0  . montelukast (SINGULAIR) 10 MG tablet Take 1 tablet (10 mg total) by mouth at bedtime. 90 tablet 3  . oxyCODONE-acetaminophen (PERCOCET/ROXICET) 5-325 MG tablet Take 1-2 tablets by mouth every 6 hours as needed. 30 tablet 0  . pantoprazole (PROTONIX) 40 MG tablet Take 1 tablet (40 mg total) by mouth daily. 30 tablet 5  . Testosterone (ANDROGEL PUMP) 20.25 MG/ACT (1.62%) GEL Place 2 Act onto the skin daily. (Patient taking differently: Place 2 Squirts onto the skin daily. ) 75 g 5  . traZODone (DESYREL) 100 MG tablet Take 3 tablets (300 mg total) by mouth at bedtime. 90 tablet 5  . triamcinolone cream (KENALOG) 0.5 % Apply 1 application topically 3 (three) times daily. 30 g 1   No facility-administered medications prior to visit.     ROS Review of Systems  Constitutional: Negative.  Negative for activity change, appetite change, chills, fatigue and fever.  HENT: Negative.  Negative for facial swelling, sore throat, trouble swallowing and voice change.   Eyes: Negative.  Negative for visual disturbance.  Respiratory: Negative for cough, choking, chest tightness, shortness of breath and stridor.   Cardiovascular: Negative.  Negative for chest pain, palpitations and leg swelling.  Gastrointestinal: Negative.  Negative for abdominal pain, blood in stool, constipation, diarrhea, nausea and vomiting.  Endocrine: Negative.   Genitourinary: Negative.  Negative for difficulty  urinating, discharge, dysuria, genital sores, penile pain, penile swelling, scrotal swelling and testicular pain.  Musculoskeletal: Negative.  Negative for arthralgias, back pain, joint swelling, myalgias and neck pain.  Skin: Negative.  Negative for color change, pallor and rash.  Allergic/Immunologic: Negative.   Neurological: Negative.  Negative for dizziness and headaches.  Hematological: Negative.  Negative for adenopathy. Does not bruise/bleed easily.  Psychiatric/Behavioral: Negative for agitation, behavioral problems, confusion, decreased concentration, dysphoric mood, sleep disturbance and suicidal ideas. The patient is nervous/anxious.     Objective:  BP 128/86 (BP Location: Left Arm, Patient Position: Sitting, Cuff Size: Normal)   Pulse 90   Temp 98.8 F (37.1 C) (Oral)   Resp 16   Ht 5\' 6"  (1.676 m)   Wt 195 lb 12 oz (88.8 kg)   SpO2 98%   BMI 31.59 kg/m   BP Readings from Last 3 Encounters:  01/28/16 128/86  05/13/15 120/70  02/25/15 118/80    Wt Readings from Last 3 Encounters:  01/28/16 195 lb 12 oz (88.8 kg)  05/13/15 207 lb (93.9 kg)  02/25/15 215 lb (97.5 kg)    Physical Exam  Constitutional: He is oriented to person, place, and time. No distress.  HENT:  Mouth/Throat: Oropharynx is clear and moist. No oropharyngeal exudate.  Eyes: Conjunctivae are normal. Right eye exhibits no discharge. Left eye exhibits no discharge. No scleral icterus.  Neck: Normal range of motion. Neck supple. No JVD present. No tracheal deviation present. No thyromegaly present.  Cardiovascular: Normal rate, regular rhythm and intact distal pulses.  Exam reveals no gallop and no friction rub.   No murmur heard. Pulmonary/Chest: Effort normal and breath sounds normal. No stridor. No respiratory distress. He has no wheezes. He has no rales. He exhibits no tenderness.  Abdominal: Soft. Bowel sounds are normal. He exhibits no distension and no mass. There is no tenderness. There is no  rebound and no guarding.  Musculoskeletal: Normal range of motion. He exhibits no edema, tenderness or deformity.  Lymphadenopathy:    He has no cervical adenopathy.  Neurological: He is oriented to person, place, and time.  Skin: Skin is warm and dry. No rash noted. He is not diaphoretic. No erythema. No pallor.  Psychiatric: He has a normal mood and affect. His behavior is normal. Judgment and thought content normal.  Vitals reviewed.   Lab Results  Component Value Date   WBC 5.9 01/28/2016   HGB 15.4 01/28/2016   HCT 44.1 01/28/2016   PLT 293.0 01/28/2016   GLUCOSE 82 01/28/2016   CHOL 167 02/20/2012   TRIG 163 (H) 02/20/2012   HDL 43 02/20/2012   LDLCALC 91 02/20/2012   ALT 20 01/28/2016   AST 22 01/28/2016   NA 137 01/28/2016   K 3.9 01/28/2016   CL 99 01/28/2016   CREATININE 0.96 01/28/2016   BUN 12 01/28/2016   CO2 29 01/28/2016   TSH 1.50 06/12/2014    Mr Leslye Peerntero Abd W/o W/cm  Result Date: 06/02/2015  CLINICAL DATA:  Crohn's disease, usually with left-sided pain, now with right and transverse colonic pain, evaluate for progression of disease. EXAM: MR ABDOMEN AND PELVIS WITHOUT AND WITH CONTRAST (MR ENTEROGRAPHY) TECHNIQUE: Multiplanar, multisequence MRI of the abdomen and pelvis was performed both before and during bolus administration of intravenous contrast. Negative oral contrast VoLumen was given. CONTRAST:  20mL MULTIHANCE GADOBENATE DIMEGLUMINE 529 MG/ML IV SOLN COMPARISON:  CT abdomen pelvis dated 03/29/2011. FINDINGS: Lower chest:  Lung bases are clear. Hepatobiliary: Liver is within normal limits. No suspicious/enhancing hepatic lesions. Gallbladder is within normal limits. No intrahepatic or extrahepatic ductal dilatation. Pancreas: Within normal limits. Spleen: Within normal limits. Adrenals/Urinary Tract: Adrenal glands are within normal limits. Kidneys are within normal limits.  No hydronephrosis. Bladder is within normal limits. Stomach/Bowel: Stomach is within  normal limits. No evidence of bowel obstruction. No small bowel wall thickening or inflammatory changes. No evidence of stricture. No colonic wall thickening or inflammatory changes. No evidence of fistula or abscess. Vascular/Lymphatic: No evidence of abdominal aortic aneurysm. No suspicious abdominopelvic lymphadenopathy. Reproductive: Prostate is unremarkable. Other: No abdominopelvic ascites. Musculoskeletal: No focal osseous lesions. IMPRESSION: No evidence of acute inflammatory Crohn's disease. No evidence of bowel obstruction or stricture. Normal MRI abdomen/pelvis. Electronically Signed   By: Charline Bills M.D.   On: 06/02/2015 10:28   Mr Leslye Peer Pelvis W/o W/cm  Result Date: 06/02/2015 CLINICAL DATA:  Crohn's disease, usually with left-sided pain, now with right and transverse colonic pain, evaluate for progression of disease. EXAM: MR ABDOMEN AND PELVIS WITHOUT AND WITH CONTRAST (MR ENTEROGRAPHY) TECHNIQUE: Multiplanar, multisequence MRI of the abdomen and pelvis was performed both before and during bolus administration of intravenous contrast. Negative oral contrast VoLumen was given. CONTRAST:  20mL MULTIHANCE GADOBENATE DIMEGLUMINE 529 MG/ML IV SOLN COMPARISON:  CT abdomen pelvis dated 03/29/2011. FINDINGS: Lower chest:  Lung bases are clear. Hepatobiliary: Liver is within normal limits. No suspicious/enhancing hepatic lesions. Gallbladder is within normal limits. No intrahepatic or extrahepatic ductal dilatation. Pancreas: Within normal limits. Spleen: Within normal limits. Adrenals/Urinary Tract: Adrenal glands are within normal limits. Kidneys are within normal limits.  No hydronephrosis. Bladder is within normal limits. Stomach/Bowel: Stomach is within normal limits. No evidence of bowel obstruction. No small bowel wall thickening or inflammatory changes. No evidence of stricture. No colonic wall thickening or inflammatory changes. No evidence of fistula or abscess. Vascular/Lymphatic: No  evidence of abdominal aortic aneurysm. No suspicious abdominopelvic lymphadenopathy. Reproductive: Prostate is unremarkable. Other: No abdominopelvic ascites. Musculoskeletal: No focal osseous lesions. IMPRESSION: No evidence of acute inflammatory Crohn's disease. No evidence of bowel obstruction or stricture. Normal MRI abdomen/pelvis. Electronically Signed   By: Charline Bills M.D.   On: 06/02/2015 10:28    Assessment & Plan:   Donta was seen today for adhd.  Diagnoses and all orders for this visit:  Need for prophylactic vaccination and inoculation against influenza -     Flu Vaccine QUAD 36+ mos IM  ADHD (attention deficit hyperactivity disorder), inattentive type -     Discontinue: amphetamine-dextroamphetamine (ADDERALL) 20 MG tablet; Take 1 tablet (20 mg total) by mouth daily. -     Discontinue: amphetamine-dextroamphetamine (ADDERALL) 20 MG tablet; Take 1 tablet (20 mg total) by mouth daily. -     Discontinue: amphetamine-dextroamphetamine (ADDERALL) 20 MG tablet; Take 1 tablet (20 mg total) by mouth daily. -     amphetamine-dextroamphetamine (ADDERALL) 20 MG tablet; Take 1 tablet (20 mg total) by mouth daily.  Routine screening for STI (sexually transmitted infection)  Prophylactic measure -     Comprehensive metabolic panel; Future -     CBC with Differential/Platelet; Future -     Hepatitis B surface antigen; Future -     HIV antibody; Future -     HSV(herpes smplx)abs-1+2(IgG+IgM)-bld; Future -     Urinalysis, Routine w reflex microscopic (not at [facility]); Future -     GC/Chlamydia Probe Amp; Future -     RPR; Future  High risk sexual behavior- his renal function is normal so we will continue Truvada, will also screen him for exposure to HIV and hepatitis B, we'll screen for other STDs as well. -     Comprehensive metabolic panel; Future -     CBC with Differential/Platelet; Future -     Hepatitis B surface antigen; Future -     HIV antibody; Future -     HSV(herpes  smplx)abs-1+2(IgG+IgM)-bld; Future -     Urinalysis, Routine w reflex microscopic (not at [facility]); Future -     GC/Chlamydia Probe Amp; Future -     RPR; Future  Exposure to sexually transmitted disease (STD)- as above -     Comprehensive metabolic panel; Future -     CBC with Differential/Platelet; Future -     Hepatitis B surface antigen; Future -     HIV antibody; Future -     HSV(herpes smplx)abs-1+2(IgG+IgM)-bld; Future -     Urinalysis, Routine w reflex microscopic (not at [facility]); Future -     GC/Chlamydia Probe Amp; Future -     RPR; Future  Depression with anxiety- will continue Xanax and I have also asked him to start sertraline, will start at 50 mg dose and increase the dose over the next few months. -     ALPRAZolam (XANAX) 1 MG tablet; Take 1 tablet (1 mg total) by mouth 2 (two) times daily as needed for anxiety or sleep. -     sertraline (ZOLOFT) 50 MG tablet; Take 1 tablet (50 mg total) by mouth at bedtime.  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   I have discontinued Mr. Eberlin ketoconazole, traZODone, Testosterone, pantoprazole, lidocaine, triamcinolone cream, Albuterol Sulfate, beclomethasone, montelukast, atomoxetine, and oxyCODONE-acetaminophen. I am also having him start on sertraline. Additionally, I am having him maintain his Adalimumab, mesalamine, LEVITRA, naproxen sodium, emtricitabine-tenofovir, amphetamine-dextroamphetamine, and ALPRAZolam.  Meds ordered this encounter  Medications  . DISCONTD: amphetamine-dextroamphetamine (ADDERALL) 20 MG tablet    Sig: Take 1 tablet (20 mg total) by mouth daily.    Dispense:  30 tablet    Refill:  0  . DISCONTD: amphetamine-dextroamphetamine (ADDERALL) 20 MG tablet    Sig: Take 1 tablet (20 mg total) by mouth daily.    Dispense:  30 tablet    Refill:  0  . DISCONTD:  amphetamine-dextroamphetamine (ADDERALL) 20 MG tablet    Sig: Take 1 tablet (20 mg total) by mouth daily.    Dispense:  30 tablet    Refill:  0  . amphetamine-dextroamphetamine (ADDERALL) 20 MG tablet    Sig: Take 1 tablet (20 mg total) by mouth daily.    Dispense:  30 tablet    Refill:  0  . ALPRAZolam (XANAX) 1 MG tablet    Sig: Take 1 tablet (1 mg total) by mouth 2 (two) times daily as needed for anxiety or sleep.  Dispense:  60 tablet    Refill:  3  . sertraline (ZOLOFT) 50 MG tablet    Sig: Take 1 tablet (50 mg total) by mouth at bedtime.    Dispense:  30 tablet    Refill:  5     Follow-up: Return in about 4 months (around 05/30/2016).  Sanda Linger, MD

## 2016-01-28 NOTE — Patient Instructions (Signed)
Attention Deficit Hyperactivity Disorder  Attention deficit hyperactivity disorder (ADHD) is a problem with behavior issues based on the way the brain functions (neurobehavioral disorder). It is a common reason for behavior and academic problems in school.  SYMPTOMS   There are 3 types of ADHD. The 3 types and some of the symptoms include:  · Inattentive.    Gets bored or distracted easily.    Loses or forgets things. Forgets to hand in homework.    Has trouble organizing or completing tasks.    Difficulty staying on task.    An inability to organize daily tasks and school work.    Leaving projects, chores, or homework unfinished.    Trouble paying attention or responding to details. Careless mistakes.    Difficulty following directions. Often seems like is not listening.    Dislikes activities that require sustained attention (like chores or homework).  · Hyperactive-impulsive.    Feels like it is impossible to sit still or stay in a seat. Fidgeting with hands and feet.    Trouble waiting turn.    Talking too much or out of turn. Interruptive.    Speaks or acts impulsively.    Aggressive, disruptive behavior.    Constantly busy or on the go; noisy.    Often leaves seat when they are expected to remain seated.    Often runs or climbs where it is not appropriate, or feels very restless.  · Combined.    Has symptoms of both of the above.  Often children with ADHD feel discouraged about themselves and with school. They often perform well below their abilities in school.  As children get older, the excess motor activities can calm down, but the problems with paying attention and staying organized persist. Most children do not outgrow ADHD but with good treatment can learn to cope with the symptoms.  DIAGNOSIS   When ADHD is suspected, the diagnosis should be made by professionals trained in ADHD. This professional will collect information about the individual suspected of having ADHD. Information must be collected from  various settings where the person lives, works, or attends school.    Diagnosis will include:  · Confirming symptoms began in childhood.  · Ruling out other reasons for the child's behavior.  · The health care providers will check with the child's school and check their medical records.  · They will talk to teachers and parents.  · Behavior rating scales for the child will be filled out by those dealing with the child on a daily basis.  A diagnosis is made only after all information has been considered.  TREATMENT   Treatment usually includes behavioral treatment, tutoring or extra support in school, and stimulant medicines. Because of the way a person's brain works with ADHD, these medicines decrease impulsivity and hyperactivity and increase attention. This is different than how they would work in a person who does not have ADHD. Other medicines used include antidepressants and certain blood pressure medicines.  Most experts agree that treatment for ADHD should address all aspects of the person's functioning. Along with medicines, treatment should include structured classroom management at school. Parents should reward good behavior, provide constant discipline, and set limits. Tutoring should be available for the child as needed.  ADHD is a lifelong condition. If untreated, the disorder can have long-term serious effects into adolescence and adulthood.  HOME CARE INSTRUCTIONS   · Often with ADHD there is a lot of frustration among family members dealing with the condition. Blame   and anger are also feelings that are common. In many cases, because the problem affects the family as a whole, the entire family may need help. A therapist can help the family find better ways to handle the disruptive behaviors of the person with ADHD and promote change. If the person with ADHD is young, most of the therapist's work is with the parents. Parents will learn techniques for coping with and improving their child's behavior.  Sometimes only the child with the ADHD needs counseling. Your health care providers can help you make these decisions.  · Children with ADHD may need help learning how to organize. Some helpful tips include:  ¨ Keep routines the same every day from wake-up time to bedtime. Schedule all activities, including homework and playtime. Keep the schedule in a place where the person with ADHD will often see it. Mark schedule changes as far in advance as possible.  ¨ Schedule outdoor and indoor recreation.  ¨ Have a place for everything and keep everything in its place. This includes clothing, backpacks, and school supplies.  ¨ Encourage writing down assignments and bringing home needed books. Work with your child's teachers for assistance in organizing school work.  · Offer your child a well-balanced diet. Breakfast that includes a balance of whole grains, protein, and fruits or vegetables is especially important for school performance. Children should avoid drinks with caffeine including:  ¨ Soft drinks.  ¨ Coffee.  ¨ Tea.  ¨ However, some older children (adolescents) may find these drinks helpful in improving their attention. Because it can also be common for adolescents with ADHD to become addicted to caffeine, talk with your health care provider about what is a safe amount of caffeine intake for your child.  · Children with ADHD need consistent rules that they can understand and follow. If rules are followed, give small rewards. Children with ADHD often receive, and expect, criticism. Look for good behavior and praise it. Set realistic goals. Give clear instructions. Look for activities that can foster success and self-esteem. Make time for pleasant activities with your child. Give lots of affection.  · Parents are their children's greatest advocates. Learn as much as possible about ADHD. This helps you become a stronger and better advocate for your child. It also helps you educate your child's teachers and instructors  if they feel inadequate in these areas. Parent support groups are often helpful. A national group with local chapters is called Children and Adults with Attention Deficit Hyperactivity Disorder (CHADD).  SEEK MEDICAL CARE IF:  · Your child has repeated muscle twitches, cough, or speech outbursts.  · Your child has sleep problems.  · Your child has a marked loss of appetite.  · Your child develops depression.  · Your child has new or worsening behavioral problems.  · Your child develops dizziness.  · Your child has a racing heart.  · Your child has stomach pains.  · Your child develops headaches.  SEEK IMMEDIATE MEDICAL CARE IF:  · Your child has been diagnosed with depression or anxiety and the symptoms seem to be getting worse.  · Your child has been depressed and suddenly appears to have increased energy or motivation.  · You are worried that your child is having a bad reaction to a medication he or she is taking for ADHD.     This information is not intended to replace advice given to you by your health care provider. Make sure you discuss any questions you have with your   health care provider.     Document Released: 04/01/2002 Document Revised: 04/16/2013 Document Reviewed: 12/17/2012  Elsevier Interactive Patient Education ©2016 [employer]

## 2016-01-28 NOTE — Progress Notes (Signed)
Pre visit review using our clinic review tool, if applicable. No additional management support is needed unless otherwise documented below in the visit note. 

## 2016-01-29 ENCOUNTER — Telehealth: Payer: Self-pay

## 2016-01-29 LAB — COMPREHENSIVE METABOLIC PANEL
ALK PHOS: 84 U/L (ref 39–117)
ALT: 20 U/L (ref 0–53)
AST: 22 U/L (ref 0–37)
Albumin: 3.9 g/dL (ref 3.5–5.2)
BILIRUBIN TOTAL: 0.5 mg/dL (ref 0.2–1.2)
BUN: 12 mg/dL (ref 6–23)
CALCIUM: 9.4 mg/dL (ref 8.4–10.5)
CO2: 29 mEq/L (ref 19–32)
Chloride: 99 mEq/L (ref 96–112)
Creatinine, Ser: 0.96 mg/dL (ref 0.40–1.50)
GFR: 98.77 mL/min (ref >60.00)
Glucose, Bld: 82 mg/dL (ref 70–99)
Potassium: 3.9 mEq/L (ref 3.5–5.1)
Sodium: 137 mEq/L (ref 135–145)
TOTAL PROTEIN: 7.9 g/dL (ref 6.0–8.3)

## 2016-01-29 LAB — RPR TITER: RPR Titer: 1:16 {titer} — AB

## 2016-01-29 LAB — HEPATITIS B SURFACE ANTIGEN: Hepatitis B Surface Ag: NEGATIVE

## 2016-01-29 LAB — RPR: RPR Ser Ql: REACTIVE — AB

## 2016-01-29 LAB — FLUORESCENT TREPONEMAL AB(FTA)-IGG-BLD: Fluorescent Treponemal ABS: REACTIVE — AB

## 2016-01-29 LAB — HIV ANTIBODY (ROUTINE TESTING W REFLEX): HIV 1&2 Ab, 4th Generation: NONREACTIVE

## 2016-01-29 NOTE — Telephone Encounter (Signed)
PA initiated and APPROVED 12/30/2015 - 01/28/2019 via CoverMyMeds key [id number]

## 2016-01-30 ENCOUNTER — Encounter: Payer: Self-pay | Admitting: Internal Medicine

## 2016-01-30 LAB — GC/CHLAMYDIA PROBE AMP
CT PROBE, AMP APTIMA: NOT DETECTED
GC PROBE AMP APTIMA: NOT DETECTED

## 2016-01-31 DIAGNOSIS — A539 Syphilis, unspecified: Secondary | ICD-10-CM | POA: Insufficient documentation

## 2016-02-01 ENCOUNTER — Other Ambulatory Visit: Payer: Self-pay | Admitting: Internal Medicine

## 2016-02-01 ENCOUNTER — Ambulatory Visit (INDEPENDENT_AMBULATORY_CARE_PROVIDER_SITE_OTHER): Payer: 59

## 2016-02-01 DIAGNOSIS — A510 Primary genital syphilis: Secondary | ICD-10-CM

## 2016-02-01 MED ORDER — PENICILLIN G BENZATHINE 1200000 UNIT/2ML IM SUSP
2.4000 10*6.[IU] | Freq: Once | INTRAMUSCULAR | Status: AC
  Administered 2016-02-01: 2.4 10*6.[IU] via INTRAMUSCULAR

## 2016-02-01 NOTE — Patient Instructions (Signed)
Pt tolerated injection well

## 2016-02-01 NOTE — Telephone Encounter (Signed)
Called pt. Explained what syphilis is and why treatment is with injections. Pt is worried that some the pain and headaches were caused by the infection. Pt wanted to know how long he has had it. Pt wanted to know why his partners did not test positive. Patient was worried but gave him facts regarding the STI and how were are going to treat it.   Will contact pt when we have the abx needed.

## 2016-02-01 NOTE — Progress Notes (Signed)
Per PCP. Pt is to receive a total of 3 weekly 2.4 million units of bicillin LA

## 2016-02-01 NOTE — Telephone Encounter (Signed)
Patient about the antibiotic injection he will be needing. Keith Walker is going to find out which anx it is. So that we can get him started on this.   Also Stefannie could you please call him asap to explain to him what he has and what the treatment is. He is freaking out over this and has a lot of questions. Please just follow up when you can. I did explain to him you are with patients and you would get to him once you could. Thank you.

## 2016-02-02 ENCOUNTER — Other Ambulatory Visit: Payer: Self-pay | Admitting: Internal Medicine

## 2016-02-02 ENCOUNTER — Encounter: Payer: Self-pay | Admitting: Internal Medicine

## 2016-02-02 DIAGNOSIS — A601 Herpesviral infection of perianal skin and rectum: Secondary | ICD-10-CM | POA: Insufficient documentation

## 2016-02-02 LAB — HSV(HERPES SMPLX)ABS-I+II(IGG+IGM)-BLD
HSV 2 GLYCOPROTEIN G AB, IGG: 9.84 {index} — AB (ref <0.90)
Herpes Simplex Vrs I&II-IgM Ab (EIA): 1.67 INDEX — ABNORMAL HIGH

## 2016-02-02 MED ORDER — VALACYCLOVIR HCL 1 G PO TABS: 1000.0000 mg | ORAL_TABLET | Freq: Three times a day (TID) | ORAL | 2 refills | Status: AC

## 2016-02-08 ENCOUNTER — Ambulatory Visit (INDEPENDENT_AMBULATORY_CARE_PROVIDER_SITE_OTHER): Payer: 59

## 2016-02-08 DIAGNOSIS — A539 Syphilis, unspecified: Secondary | ICD-10-CM

## 2016-02-09 MED ORDER — PENICILLIN G BENZATHINE 1200000 UNIT/2ML IM SUSP
2.4000 10*6.[IU] | Freq: Once | INTRAMUSCULAR | Status: AC
  Administered 2016-02-09: 2.4 10*6.[IU] via INTRAMUSCULAR

## 2016-02-09 MED ORDER — PENICILLIN G BENZATHINE 1200000 UNIT/2ML IM SUSP: 2.4000 10*6.[IU] | Freq: Once | INTRAMUSCULAR | Status: DC

## 2016-02-15 ENCOUNTER — Ambulatory Visit (INDEPENDENT_AMBULATORY_CARE_PROVIDER_SITE_OTHER): Payer: 59

## 2016-02-15 DIAGNOSIS — A539 Syphilis, unspecified: Secondary | ICD-10-CM | POA: Diagnosis not present

## 2016-02-15 MED ORDER — PENICILLIN G BENZATHINE 1200000 UNIT/2ML IM SUSP
2.4000 10*6.[IU] | Freq: Once | INTRAMUSCULAR | Status: AC
  Administered 2016-02-15: 2.4 10*6.[IU] via INTRAMUSCULAR

## 2016-02-16 ENCOUNTER — Telehealth: Payer: Self-pay | Admitting: Internal Medicine

## 2016-02-16 ENCOUNTER — Encounter: Payer: Self-pay | Admitting: Internal Medicine

## 2016-02-16 NOTE — Telephone Encounter (Signed)
[facility] to speak to the assistant concern about Keith Walker that we send over to them on Keith Walker. Please call her back

## 2016-02-18 NOTE — Telephone Encounter (Signed)
Pertinent information regarding positive lab given to [facility] .

## 2016-02-18 NOTE — Telephone Encounter (Signed)
lvmm for Bobbie to call back.   RE: please get me if she calls back.

## 2016-02-22 ENCOUNTER — Telehealth: Payer: Self-pay

## 2016-02-22 NOTE — Telephone Encounter (Signed)
PA initiated via CoverMyMeds key [id number].  APPROVED 01/23/2016 - 02/21/2019

## 2016-03-15 ENCOUNTER — Telehealth: Payer: Self-pay

## 2016-03-15 ENCOUNTER — Other Ambulatory Visit: Payer: Self-pay | Admitting: Internal Medicine

## 2016-03-15 ENCOUNTER — Telehealth: Payer: Self-pay | Admitting: Internal Medicine

## 2016-03-15 DIAGNOSIS — F418 Other specified anxiety disorders: Secondary | ICD-10-CM

## 2016-03-15 MED ORDER — SERTRALINE HCL 50 MG PO TABS: 50.0000 mg | ORAL_TABLET | Freq: Every day | ORAL | 1 refills | Status: AC

## 2016-03-15 NOTE — Telephone Encounter (Signed)
This went to [id number]?   Pt has an appt tomorrow.

## 2016-03-15 NOTE — Telephone Encounter (Signed)
rf rq for #90 supply of sertraline. 30 with 5 refills was sent.  Resent for #90 and 1 refill.

## 2016-03-15 NOTE — Telephone Encounter (Signed)
Patient Name: Whitney PostICKOLAS CALZARET TA DOB: 02/03/1986 Initial Comment Caller states c/o chest congestion. Nurse Assessment Nurse: Yetta BarreJones, RN, Miranda Date/Time (Eastern Time): 03/15/2016 11:03:11 AM Confirm and document reason for call. If symptomatic, describe symptoms. You must click the next button to save text entered. ---Caller states she has congestion in his chest. He is able to cough to clear his airway but it is hard. Denies fever. Does the patient have any new or worsening symptoms? ---Yes Will a triage be completed? ---Yes Related visit to physician within the last 2 weeks? ---No Does the PT have any chronic conditions? (i.e. diabetes, asthma, etc.) ---Yes List chronic conditions. ---Crohn's disease, Allergies Is this a behavioral health or substance abuse call? ---No Guidelines Guideline Title Affirmed Question Affirmed Notes Cough - Acute Productive Earache Final Disposition User See Physician within 24 Hours Yetta BarreJones, RN, Miranda Comments Appt scheduled for tomorrow 11/22 at 11 am with Dr. Jonny RuizJohn. Referrals REFERRED TO PCP OFFICE Disagree/Comply: Comply

## 2016-03-16 ENCOUNTER — Encounter: Payer: Self-pay | Admitting: Internal Medicine

## 2016-03-16 ENCOUNTER — Ambulatory Visit (INDEPENDENT_AMBULATORY_CARE_PROVIDER_SITE_OTHER): Payer: 59 | Admitting: Internal Medicine

## 2016-03-16 DIAGNOSIS — G4700 Insomnia, unspecified: Secondary | ICD-10-CM

## 2016-03-16 DIAGNOSIS — A601 Herpesviral infection of perianal skin and rectum: Secondary | ICD-10-CM

## 2016-03-16 DIAGNOSIS — J0101 Acute recurrent maxillary sinusitis: Secondary | ICD-10-CM

## 2016-03-16 MED ORDER — AMOXICILLIN-POT CLAVULANATE 875-125 MG PO TABS: 1.0000 | ORAL_TABLET | Freq: Two times a day (BID) | ORAL | 0 refills | Status: DC

## 2016-03-16 NOTE — Assessment & Plan Note (Signed)
Augmentin x 10 d 

## 2016-03-16 NOTE — Progress Notes (Signed)
Subjective:  Patient ID: Keith Walker, male    DOB: 07/06/1985  Age: 30 y.o. MRN: [id number]  CC: No chief complaint on file.   Sinusitis  This is a recurrent problem. The current episode started in the past 7 days. The problem has been gradually worsening since onset. There has been no fever. The pain is moderate. Associated symptoms include congestion and coughing. Pertinent negatives include no neck pain, sneezing or sore throat. (Brown mucus) Past treatments include lying down and oral decongestants. The treatment provided mild relief.     Keith Walker presents for sinusitis. 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 Adalimumab (HUMIRA PEN) 40 MG/0.8ML PNKT Inject 40 mg into the muscle every 14 (fourteen) days. Patient adminsters IM himself    . ALPRAZolam (XANAX) 1 MG tablet Take 1 tablet (1 mg total) by mouth 2 (two) times daily as needed for anxiety or sleep. 60 tablet 3  . amphetamine-dextroamphetamine (ADDERALL) 20 MG tablet Take 1 tablet (20 mg total) by mouth daily. 30 tablet 0  . emtricitabine-tenofovir (TRUVADA) 200-300 MG tablet Take 1 tablet by mouth daily. 90 tablet 1  . LEVITRA 10 MG tablet Take 1 tablet by mouth as needed for erectile dysfunction.   11  . mesalamine (LIALDA) 1.2 G EC tablet Take 4 tablets (4.8 g total) by mouth daily with breakfast. (Patient taking differently: Take 2.4 g by mouth daily with breakfast. ) 120 tablet 11  . naproxen sodium (ANAPROX) 220 MG tablet Take 440 mg by mouth 2 (two) times daily as needed (pain).    Marland Kitchen. sertraline (ZOLOFT) 50 MG tablet Take 1 tablet (50 mg total) by mouth at bedtime. 90 tablet 1   No facility-administered medications prior to visit.     ROS Review of Systems  Constitutional: Negative for appetite change, fatigue and unexpected weight change.  HENT: Positive for congestion, postnasal drip and rhinorrhea. Negative for  nosebleeds, sneezing, sore throat and trouble swallowing.   Eyes: Negative for itching and visual disturbance.  Respiratory: Positive for cough.   Cardiovascular: Negative for chest pain, palpitations and leg swelling.  Gastrointestinal: Negative for abdominal distention, blood in stool, diarrhea and nausea.  Genitourinary: Negative for frequency and hematuria.  Musculoskeletal: Negative for back pain, gait problem, joint swelling and neck pain.  Skin: Negative for rash.  Neurological: Negative for dizziness, tremors, speech difficulty and weakness.  Psychiatric/Behavioral: Positive for sleep disturbance. Negative for agitation and dysphoric mood. The patient is not nervous/anxious.     Objective:  BP 118/86   Pulse 97   Temp 97.9 F (36.6 C)   Wt 186 lb (84.4 kg)   SpO2 100%   BMI 30.02 kg/m   BP Readings from Last 3 Encounters:  03/16/16 118/86  01/28/16 128/86  05/13/15 120/70    Wt Readings from Last 3 Encounters:  03/16/16 186 lb (84.4 kg)  01/28/16 195 lb 12 oz (88.8 kg)  05/13/15 207 lb (93.9 kg)    Physical Exam  Constitutional: He is oriented to person, place, and time. He appears well-developed. No distress.  NAD  HENT:  Mouth/Throat: Oropharynx is clear and moist.  Eyes: Conjunctivae are normal. Pupils are equal, round, and reactive to light.  Neck: Normal range of motion. No JVD present. No thyromegaly present.  Cardiovascular: Normal rate, regular rhythm, normal heart sounds and intact distal pulses.  Exam reveals no gallop and no friction rub.  No murmur heard. Pulmonary/Chest: Effort normal and breath sounds normal. No respiratory distress. He has no wheezes. He has no rales. He exhibits no tenderness.  Abdominal: Soft. Bowel sounds are normal. He exhibits no distension and no mass. There is no tenderness. There is no rebound and no guarding.  Musculoskeletal: Normal range of motion. He exhibits no edema or tenderness.  Lymphadenopathy:    He has no  cervical adenopathy.  Neurological: He is alert and oriented to person, place, and time. He has normal reflexes. No cranial nerve deficit. He exhibits normal muscle tone. He displays a negative Romberg sign. Coordination and gait normal.  Skin: Skin is warm and dry. No rash noted.  Psychiatric: He has a normal mood and affect. His behavior is normal. Judgment and thought content normal.  eryth throat, nasal mucosa  Lab Results  Component Value Date   WBC 5.9 01/28/2016   HGB 15.4 01/28/2016   HCT 44.1 01/28/2016   PLT 293.0 01/28/2016   GLUCOSE 82 01/28/2016   CHOL 167 02/20/2012   TRIG 163 (H) 02/20/2012   HDL 43 02/20/2012   LDLCALC 91 02/20/2012   ALT 20 01/28/2016   AST 22 01/28/2016   NA 137 01/28/2016   K 3.9 01/28/2016   CL 99 01/28/2016   CREATININE 0.96 01/28/2016   BUN 12 01/28/2016   CO2 29 01/28/2016   TSH 1.50 06/12/2014    Keith Walker  Result Date: 06/02/2015 CLINICAL DATA:  Crohn's disease, usually with left-sided pain, now with right and transverse colonic pain, evaluate for progression of disease. EXAM: Keith ABDOMEN AND PELVIS WITHOUT AND WITH CONTRAST (Keith ENTEROGRAPHY) TECHNIQUE: Multiplanar, multisequence MRI of the abdomen and pelvis was performed both before and during bolus administration of intravenous contrast. Negative oral contrast VoLumen was given. CONTRAST:  20mL MULTIHANCE GADOBENATE DIMEGLUMINE 529 MG/ML IV SOLN COMPARISON:  CT abdomen pelvis dated 03/29/2011. FINDINGS: Lower chest:  Lung bases are clear. Hepatobiliary: Liver is within normal limits. No suspicious/enhancing hepatic lesions. Gallbladder is within normal limits. No intrahepatic or extrahepatic ductal dilatation. Pancreas: Within normal limits. Spleen: Within normal limits. Adrenals/Urinary Tract: Adrenal glands are within normal limits. Kidneys are within normal limits.  No hydronephrosis. Bladder is within normal limits. Stomach/Bowel: Stomach is within normal limits. No evidence of  bowel obstruction. No small bowel wall thickening or inflammatory changes. No evidence of stricture. No colonic wall thickening or inflammatory changes. No evidence of fistula or abscess. Vascular/Lymphatic: No evidence of abdominal aortic aneurysm. No suspicious abdominopelvic lymphadenopathy. Reproductive: Prostate is unremarkable. Other: No abdominopelvic ascites. Musculoskeletal: No focal osseous lesions. IMPRESSION: No evidence of acute inflammatory Crohn's disease. No evidence of bowel obstruction or stricture. Normal MRI abdomen/pelvis. Electronically Signed   By: Charline Bills M.D.   On: 06/02/2015 10:28   Keith Leslye Peer Pelvis W/o Walker  Result Date: 06/02/2015 CLINICAL DATA:  Crohn's disease, usually with left-sided pain, now with right and transverse colonic pain, evaluate for progression of disease. EXAM: Keith ABDOMEN AND PELVIS WITHOUT AND WITH CONTRAST (Keith ENTEROGRAPHY) TECHNIQUE: Multiplanar, multisequence MRI of the abdomen and pelvis was performed both before and during bolus administration of intravenous contrast. Negative oral contrast VoLumen was given. CONTRAST:  20mL MULTIHANCE GADOBENATE DIMEGLUMINE 529 MG/ML IV SOLN COMPARISON:  CT abdomen pelvis dated 03/29/2011. FINDINGS: Lower chest:  Lung bases are clear. Hepatobiliary: Liver is within normal limits. No suspicious/enhancing hepatic lesions. Gallbladder is within normal limits. No intrahepatic or extrahepatic ductal dilatation. Pancreas: Within normal limits. Spleen: Within normal limits.  Adrenals/Urinary Tract: Adrenal glands are within normal limits. Kidneys are within normal limits.  No hydronephrosis. Bladder is within normal limits. Stomach/Bowel: Stomach is within normal limits. No evidence of bowel obstruction. No small bowel wall thickening or inflammatory changes. No evidence of stricture. No colonic wall thickening or inflammatory changes. No evidence of fistula or abscess. Vascular/Lymphatic: No evidence of abdominal aortic  aneurysm. No suspicious abdominopelvic lymphadenopathy. Reproductive: Prostate is unremarkable. Other: No abdominopelvic ascites. Musculoskeletal: No focal osseous lesions. IMPRESSION: No evidence of acute inflammatory Crohn's disease. No evidence of bowel obstruction or stricture. Normal MRI abdomen/pelvis. Electronically Signed   By: Charline BillsSriyesh  Krishnan M.D.   On: 06/02/2015 10:28    Assessment & Plan:   There are no diagnoses linked to this encounter. I am having Keith. Phoebe PerchCalzaretta maintain his Adalimumab, mesalamine, LEVITRA, naproxen sodium, emtricitabine-tenofovir, amphetamine-dextroamphetamine, ALPRAZolam, and sertraline.  No orders of the defined types were placed in this encounter.    Follow-up: No Follow-up on file.  Sonda PrimesAlex Keitra Carusone, MD

## 2016-03-16 NOTE — Assessment & Plan Note (Addendum)
Worse Try to take Zoloft 100 mg at hs. Valerian root at hs He is not willing to reduce Adderall dose in am F/u w/Dr Yetta BarreJones

## 2016-03-16 NOTE — Assessment & Plan Note (Signed)
PRN vs daily antivirals discussed Use condoms for the HSV transfer prevention

## 2016-03-16 NOTE — Progress Notes (Signed)
Pre visit review using our clinic review tool, if applicable. No additional management support is needed unless otherwise documented below in the visit note. 

## 2016-03-16 NOTE — Patient Instructions (Signed)
Try Valerian root for sleep 

## 2016-03-24 ENCOUNTER — Telehealth: Payer: Self-pay | Admitting: Internal Medicine

## 2016-03-24 NOTE — Telephone Encounter (Signed)
Patient Name: Keith Walker  DOB: 10/13/1985    Initial Comment caller states he thinks he may be having a potential herpes outbreak   Nurse Assessment  Nurse: Stefano GaulStringer, RN, Dwana CurdVera Date/Time (Eastern Time): 03/24/2016 1:04:43 PM  Confirm and document reason for call. If symptomatic, describe symptoms. ---Caller states he believes he has a herpes breakout. Has tingling in his buttocks that comes and goes. No sore yet. Had herpes breakout last month. No fever. Dr. Yetta BarreJones told him to call if he had any tingling.  Does the patient have any new or worsening symptoms? ---Yes  Will a triage be completed? ---Yes  Related visit to physician within the last 2 weeks? ---No  Does the PT have any chronic conditions? (i.e. diabetes, asthma, etc.) ---Yes  List chronic conditions. ---herpes  Is this a behavioral health or substance abuse call? ---No     Guidelines    Guideline Title Affirmed Question Affirmed Notes  Hip Pain Hip pain (all triage questions negative)    Final Disposition User   Home Care WeldonStringer, RN, Dwana CurdVera    Comments  pt already has acyclovir. He has enough medication to take 3 pills daily for 7 days. he wants to know if he should start the prescription and how long he should take it. Please call pt back regarding medication.   Disagree/Comply: Comply

## 2016-04-11 ENCOUNTER — Other Ambulatory Visit: Payer: Self-pay | Admitting: Internal Medicine

## 2016-04-11 ENCOUNTER — Other Ambulatory Visit: Payer: Self-pay | Admitting: *Deleted

## 2016-04-11 DIAGNOSIS — F900 Attention-deficit hyperactivity disorder, predominantly inattentive type: Secondary | ICD-10-CM

## 2016-04-11 MED ORDER — AMPHETAMINE-DEXTROAMPHETAMINE 20 MG PO TABS: 20.0000 mg | ORAL_TABLET | Freq: Every day | ORAL | 0 refills | Status: DC

## 2016-04-11 NOTE — Telephone Encounter (Signed)
Pt informed rx is ready for pick up at the front.  

## 2016-04-11 NOTE — Telephone Encounter (Signed)
Pt left msg on triage stating he has lost his rx for Adderralll. Wanting to know if MD can re-write rx...Raechel Chute/lmb

## 2016-04-11 NOTE — Telephone Encounter (Signed)
RX written 

## 2016-04-26 ENCOUNTER — Encounter: Payer: Self-pay | Admitting: Internal Medicine

## 2016-04-27 ENCOUNTER — Other Ambulatory Visit: Payer: Self-pay | Admitting: Internal Medicine

## 2016-04-27 DIAGNOSIS — A601 Herpesviral infection of perianal skin and rectum: Secondary | ICD-10-CM

## 2016-04-27 MED ORDER — VALACYCLOVIR HCL 500 MG PO TABS: 500.0000 mg | ORAL_TABLET | Freq: Two times a day (BID) | ORAL | 3 refills | Status: DC

## 2016-05-09 ENCOUNTER — Telehealth: Payer: Self-pay | Admitting: Internal Medicine

## 2016-05-09 NOTE — Telephone Encounter (Signed)
Patient found out that he has to leave town for work for several weeks. He is asking if he can do his follow up labs for syphilis check before this Thursday. Please notify him via mychart

## 2016-05-09 NOTE — Telephone Encounter (Signed)
Lab redrawn sooner than expected due to pt leaving town or should pt have it done when he returns?

## 2016-05-10 ENCOUNTER — Other Ambulatory Visit: Payer: Self-pay | Admitting: Internal Medicine

## 2016-05-10 ENCOUNTER — Telehealth: Payer: Self-pay | Admitting: *Deleted

## 2016-05-10 MED ORDER — EMTRICITABINE-TENOFOVIR DF 200-300 MG PO TABS: 1.0000 | ORAL_TABLET | Freq: Every day | ORAL | 1 refills | Status: DC

## 2016-05-10 NOTE — Telephone Encounter (Signed)
Rec'd call from pt pharmacy he is needing refill on the Truvada 90 day supply. Pls advise...Raechel Chute/lmb

## 2016-05-13 ENCOUNTER — Other Ambulatory Visit: Payer: Self-pay | Admitting: Internal Medicine

## 2016-05-13 NOTE — Telephone Encounter (Signed)
This has already been done.

## 2016-05-17 ENCOUNTER — Ambulatory Visit: Payer: 59 | Admitting: Internal Medicine

## 2016-05-19 NOTE — Telephone Encounter (Signed)
Pt called to follow up on this request. Pt stating is now out of town and will not be coming back until the end of February, so he is hopping to get the lab work done when he comes back  Pt also stating he called to cancel the appt on 05/17/16 before the appt date and he is making sure he is not going to get charge.

## 2016-05-23 NOTE — Telephone Encounter (Signed)
05/17/16 Appt changed from No Show to Cancelled by pt.

## 2016-05-25 ENCOUNTER — Telehealth: Payer: Self-pay | Admitting: Internal Medicine

## 2016-05-25 NOTE — Telephone Encounter (Signed)
yes

## 2016-05-25 NOTE — Telephone Encounter (Signed)
Pt added and my chart message sent to him.

## 2016-05-25 NOTE — Telephone Encounter (Signed)
Pt is only in town Monday for a few hours.  He wants to know asap if Dr Yetta Barrejones can work him in Monday between 4  And 5 so he can be retested ?  Can you call him asap.  He said leave him a message he will be in meetings

## 2016-05-30 ENCOUNTER — Encounter: Payer: Self-pay | Admitting: Internal Medicine

## 2016-05-30 ENCOUNTER — Ambulatory Visit (INDEPENDENT_AMBULATORY_CARE_PROVIDER_SITE_OTHER): Payer: 59 | Admitting: Internal Medicine

## 2016-05-30 ENCOUNTER — Other Ambulatory Visit (INDEPENDENT_AMBULATORY_CARE_PROVIDER_SITE_OTHER): Payer: 59

## 2016-05-30 VITALS — BP 150/98 | HR 101 | Temp 98.1°F | Ht 66.0 in | Wt 188.0 lb

## 2016-05-30 VITALS — BP 133/88 | HR 103 | Wt 187.0 lb

## 2016-05-30 DIAGNOSIS — B309 Viral conjunctivitis, unspecified: Secondary | ICD-10-CM | POA: Diagnosis not present

## 2016-05-30 DIAGNOSIS — N342 Other urethritis: Secondary | ICD-10-CM

## 2016-05-30 DIAGNOSIS — A510 Primary genital syphilis: Secondary | ICD-10-CM

## 2016-05-30 DIAGNOSIS — R21 Rash and other nonspecific skin eruption: Secondary | ICD-10-CM

## 2016-05-30 DIAGNOSIS — H1033 Unspecified acute conjunctivitis, bilateral: Secondary | ICD-10-CM

## 2016-05-30 LAB — CBC WITH DIFFERENTIAL/PLATELET
BASOS ABS: 0 10*3/uL (ref 0.0–0.1)
BASOS PCT: 0.5 % (ref 0.0–3.0)
EOS PCT: 1.3 % (ref 0.0–5.0)
Eosinophils Absolute: 0.1 10*3/uL (ref 0.0–0.7)
HEMATOCRIT: 44.7 % (ref 39.0–52.0)
Hemoglobin: 15.9 g/dL (ref 13.0–17.0)
LYMPHS PCT: 29.9 % (ref 12.0–46.0)
Lymphs Abs: 1.9 10*3/uL (ref 0.7–4.0)
MCHC: 35.6 g/dL (ref 30.0–36.0)
MCV: 96.8 fl (ref 78.0–100.0)
MONOS PCT: 7.4 % (ref 3.0–12.0)
Monocytes Absolute: 0.5 10*3/uL (ref 0.1–1.0)
NEUTROS ABS: 3.8 10*3/uL (ref 1.4–7.7)
Neutrophils Relative %: 60.9 % (ref 43.0–77.0)
PLATELETS: 221 10*3/uL (ref 150.0–400.0)
RBC: 4.61 Mil/uL (ref 4.22–5.81)
RDW: 14.1 % (ref 11.5–15.5)
WBC: 6.3 10*3/uL (ref 4.0–10.5)

## 2016-05-30 LAB — URINALYSIS, ROUTINE W REFLEX MICROSCOPIC
Bilirubin Urine: NEGATIVE
HGB URINE DIPSTICK: NEGATIVE
Ketones, ur: NEGATIVE
LEUKOCYTES UA: NEGATIVE
Nitrite: NEGATIVE
RBC / HPF: NONE SEEN (ref 0–?)
Specific Gravity, Urine: <=1.005 — AB (ref 1.000–1.030)
TOTAL PROTEIN, URINE-UPE24: NEGATIVE
Urine Glucose: NEGATIVE
Urobilinogen, UA: 0.2 (ref 0.0–1.0)
WBC, UA: NONE SEEN (ref 0–?)
pH: 6.5 (ref 5.0–8.0)

## 2016-05-30 LAB — COMPREHENSIVE METABOLIC PANEL
ALT: 13 U/L (ref 0–53)
AST: 15 U/L (ref 0–37)
Albumin: 4.4 g/dL (ref 3.5–5.2)
Alkaline Phosphatase: 58 U/L (ref 39–117)
BUN: 12 mg/dL (ref 6–23)
CALCIUM: 9.6 mg/dL (ref 8.4–10.5)
CHLORIDE: 102 meq/L (ref 96–112)
CO2: 29 meq/L (ref 19–32)
Creatinine, Ser: 0.86 mg/dL (ref 0.40–1.50)
GFR: 111.87 mL/min (ref >60.00)
Glucose, Bld: 94 mg/dL (ref 70–99)
POTASSIUM: 4.3 meq/L (ref 3.5–5.1)
Sodium: 138 mEq/L (ref 135–145)
Total Bilirubin: 0.6 mg/dL (ref 0.2–1.2)
Total Protein: 7.6 g/dL (ref 6.0–8.3)

## 2016-05-30 LAB — SEDIMENTATION RATE: Sed Rate: 4 mm/hr (ref 0–15)

## 2016-05-30 MED ORDER — POLYMYXIN B-TRIMETHOPRIM 10000-0.1 UNIT/ML-% OP SOLN: 2.0000 [drp] | OPHTHALMIC | 0 refills | Status: DC

## 2016-05-30 MED ORDER — CAMPHOR 0.45 % EX GEL: 1.0000 "application " | Freq: Two times a day (BID) | CUTANEOUS | 1 refills | Status: DC

## 2016-05-30 MED ORDER — CEFTRIAXONE SODIUM 250 MG IJ SOLR
250.0000 mg | Freq: Once | INTRAMUSCULAR | Status: AC
  Administered 2016-05-30: 250 mg via INTRAMUSCULAR

## 2016-05-30 NOTE — Progress Notes (Signed)
Pre visit review using our clinic review tool, if applicable. No additional management support is needed unless otherwise documented below in the visit note. 

## 2016-05-30 NOTE — Progress Notes (Signed)
Subjective:  Patient ID: Keith Walker, male    DOB: Sep 18, 1985  Age: 31 y.o. MRN: [id number]  CC: Rash   HPI Keith Walker presents for concerns about a 2 week history of mildly pruritic rash on his torso and arms. The rash is most bothersome at night while sleeping. He has tried Benadryl and topical hydrocortisone without much relief from the rash. He also complains for the last few days of intense eye pain, red eyes, and chills. 2 days ago he was seen at an urgent care center and was felt to have a sinus infection and was started on Cefdinir and topical moxifloxacin. This combination has offered minimal symptom relief. He denies headache, night sweats, nausea, vomiting, sore throat, lymphadenopathy, abdominal pain, or diarrhea. He also complains of dysuria and tells me that he can milk a whitish discharge from the tip of his penis.  Outpatient Medications Prior to Visit  Medication Sig Dispense Refill  . Adalimumab (HUMIRA PEN) 40 MG/0.8ML PNKT Inject 40 mg into the muscle every 14 (fourteen) days. Patient adminsters IM himself    . ALPRAZolam (XANAX) 1 MG tablet Take 1 tablet (1 mg total) by mouth 2 (two) times daily as needed for anxiety or sleep. 60 tablet 3  . amoxicillin-clavulanate (AUGMENTIN) 875-125 MG tablet Take 1 tablet by mouth 2 (two) times daily. 20 tablet 0  . amphetamine-dextroamphetamine (ADDERALL) 20 MG tablet Take 1 tablet (20 mg total) by mouth daily. 30 tablet 0  . emtricitabine-tenofovir (TRUVADA) 200-300 MG tablet Take 1 tablet by mouth daily. 90 tablet 1  . LEVITRA 10 MG tablet Take 1 tablet by mouth as needed for erectile dysfunction.   11  . mesalamine (LIALDA) 1.2 G EC tablet Take 4 tablets (4.8 g total) by mouth daily with breakfast. (Patient taking differently: Take 2.4 g by mouth daily with breakfast. ) 120 tablet 11  . naproxen sodium (ANAPROX) 220 MG tablet Take 440 mg by mouth 2 (two) times daily as needed (pain).    Marland Kitchen sertraline (ZOLOFT) 50 MG  tablet Take 1 tablet (50 mg total) by mouth at bedtime. 90 tablet 1  . valACYclovir (VALTREX) 500 MG tablet Take 1 tablet (500 mg total) by mouth 2 (two) times daily. 90 tablet 3   No facility-administered medications prior to visit.     ROS Review of Systems  Constitutional: Positive for chills. Negative for activity change, fatigue and fever.  HENT: Positive for sinus pain and sinus pressure. Negative for facial swelling, sore throat and trouble swallowing.   Eyes: Positive for photophobia, pain, discharge and redness. Negative for itching and visual disturbance.  Respiratory: Negative.  Negative for cough, chest tightness, shortness of breath and wheezing.   Cardiovascular: Negative for chest pain, palpitations and leg swelling.  Gastrointestinal: Negative for abdominal pain, diarrhea, nausea and vomiting.  Endocrine: Negative.   Genitourinary: Positive for discharge and dysuria. Negative for decreased urine volume, difficulty urinating, frequency, genital sores, hematuria, testicular pain and urgency.  Musculoskeletal: Negative.  Negative for back pain, neck pain and neck stiffness.  Skin: Positive for rash. Negative for color change.  Allergic/Immunologic: Negative.   Neurological: Negative.  Negative for weakness, numbness and headaches.  Hematological: Negative for adenopathy. Does not bruise/bleed easily.  Psychiatric/Behavioral: Negative.     Objective:  BP (!) 150/98 (BP Location: Left Arm, Patient Position: Sitting, Cuff Size: Normal)   Pulse (!) 101   Temp 98.1 F (36.7 C) (Oral)   Ht 5\' 6"  (1.676 m)   Wt 188  lb (85.3 kg)   SpO2 98%   BMI 30.34 kg/m   BP Readings from Last 3 Encounters:  05/30/16 (!) 150/98  03/16/16 118/86  01/28/16 128/86    Wt Readings from Last 3 Encounters:  05/30/16 188 lb (85.3 kg)  03/16/16 186 lb (84.4 kg)  01/28/16 195 lb 12 oz (88.8 kg)    Physical Exam  Constitutional: He is oriented to person, place, and time.  Non-toxic  appearance. He does not have a sickly appearance. He appears ill. He appears distressed.  HENT:  Mouth/Throat: Oropharynx is clear and moist. No oropharyngeal exudate.  Eyes: Right conjunctiva is injected. Left conjunctiva is injected. No scleral icterus. Right eye exhibits normal extraocular motion and no nystagmus. Left eye exhibits normal extraocular motion and no nystagmus. Right pupil is not reactive. Left pupil is not reactive.    Bulbar in palpebral conjunctival surfaces are intensely injected and the eyelids are mildly tender and swollen. His pupils are minimally reactive to light accommodation consistent with ArgylL Merilynn Finland pupils.  Neck: Normal range of motion. Neck supple. No JVD present. No tracheal deviation present. No thyromegaly present.  Cardiovascular: Normal rate, regular rhythm, normal heart sounds and intact distal pulses.  Exam reveals no gallop and no friction rub.   No murmur heard. Pulmonary/Chest: Effort normal and breath sounds normal. No stridor. No respiratory distress. He has no wheezes. He has no rales. He exhibits no tenderness.  Abdominal: Soft. Bowel sounds are normal. He exhibits no distension and no mass. There is no tenderness. There is no rebound and no guarding.  Musculoskeletal: Normal range of motion. He exhibits no edema, tenderness or deformity.  Lymphadenopathy:    He has no cervical adenopathy.  Neurological: He is oriented to person, place, and time.  Skin: Skin is warm and dry. Rash noted. No purpura noted. Rash is papular. Rash is not macular, not maculopapular, not nodular, not pustular, not vesicular and not urticarial. He is not diaphoretic. No erythema. No pallor.  There are tiny, blanching, erythematous papules located symmetrically on the torso and upper extremities. There is no involvement of the palms and soles.  Psychiatric: He has a normal mood and affect. His behavior is normal. Judgment and thought content normal.  Vitals  reviewed.   Lab Results  Component Value Date   WBC 6.3 05/30/2016   HGB 15.9 05/30/2016   HCT 44.7 05/30/2016   PLT 221.0 05/30/2016   GLUCOSE 94 05/30/2016   CHOL 167 02/20/2012   TRIG 163 (H) 02/20/2012   HDL 43 02/20/2012   LDLCALC 91 02/20/2012   ALT 13 05/30/2016   AST 15 05/30/2016   NA 138 05/30/2016   K 4.3 05/30/2016   CL 102 05/30/2016   CREATININE 0.86 05/30/2016   BUN 12 05/30/2016   CO2 29 05/30/2016   TSH 1.50 06/12/2014    [name]  Result Date: 06/02/2015 CLINICAL DATA:  Crohn's disease, usually with left-sided pain, now with right and transverse colonic pain, evaluate for progression of disease. EXAM: MR ABDOMEN AND PELVIS WITHOUT AND WITH CONTRAST (MR ENTEROGRAPHY) TECHNIQUE: Multiplanar, multisequence MRI of the abdomen and pelvis was performed both before and during bolus administration of intravenous contrast. Negative oral contrast VoLumen was given. CONTRAST:  20mL MULTIHANCE GADOBENATE DIMEGLUMINE 529 MG/ML IV SOLN COMPARISON:  CT abdomen pelvis dated 03/29/2011. FINDINGS: Lower chest:  Lung bases are clear. Hepatobiliary: Liver is within normal limits. No suspicious/enhancing hepatic lesions. Gallbladder is within normal limits. No intrahepatic or extrahepatic  ductal dilatation. Pancreas: Within normal limits. Spleen: Within normal limits. Adrenals/Urinary Tract: Adrenal glands are within normal limits. Kidneys are within normal limits.  No hydronephrosis. Bladder is within normal limits. Stomach/Bowel: Stomach is within normal limits. No evidence of bowel obstruction. No small bowel wall thickening or inflammatory changes. No evidence of stricture. No colonic wall thickening or inflammatory changes. No evidence of fistula or abscess. Vascular/Lymphatic: No evidence of abdominal aortic aneurysm. No suspicious abdominopelvic lymphadenopathy. Reproductive: Prostate is unremarkable. Other: No abdominopelvic ascites. Musculoskeletal: No focal osseous  lesions. IMPRESSION: No evidence of acute inflammatory Crohn's disease. No evidence of bowel obstruction or stricture. Normal MRI abdomen/pelvis. Electronically Signed   By: Charline Bills M.D.   On: 06/02/2015 10:28   Mr Leslye Peer Pelvis W/o W/cm  Result Date: 06/02/2015 CLINICAL DATA:  Crohn's disease, usually with left-sided pain, now with right and transverse colonic pain, evaluate for progression of disease. EXAM: MR ABDOMEN AND PELVIS WITHOUT AND WITH CONTRAST (MR ENTEROGRAPHY) TECHNIQUE: Multiplanar, multisequence MRI of the abdomen and pelvis was performed both before and during bolus administration of intravenous contrast. Negative oral contrast VoLumen was given. CONTRAST:  20mL MULTIHANCE GADOBENATE DIMEGLUMINE 529 MG/ML IV SOLN COMPARISON:  CT abdomen pelvis dated 03/29/2011. FINDINGS: Lower chest:  Lung bases are clear. Hepatobiliary: Liver is within normal limits. No suspicious/enhancing hepatic lesions. Gallbladder is within normal limits. No intrahepatic or extrahepatic ductal dilatation. Pancreas: Within normal limits. Spleen: Within normal limits. Adrenals/Urinary Tract: Adrenal glands are within normal limits. Kidneys are within normal limits.  No hydronephrosis. Bladder is within normal limits. Stomach/Bowel: Stomach is within normal limits. No evidence of bowel obstruction. No small bowel wall thickening or inflammatory changes. No evidence of stricture. No colonic wall thickening or inflammatory changes. No evidence of fistula or abscess. Vascular/Lymphatic: No evidence of abdominal aortic aneurysm. No suspicious abdominopelvic lymphadenopathy. Reproductive: Prostate is unremarkable. Other: No abdominopelvic ascites. Musculoskeletal: No focal osseous lesions. IMPRESSION: No evidence of acute inflammatory Crohn's disease. No evidence of bowel obstruction or stricture. Normal MRI abdomen/pelvis. Electronically Signed   By: Charline Bills M.D.   On: 06/02/2015 10:28    Assessment & Plan:    Bassem was seen today for rash.  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 He will either be seen today by infectious disease or will be admitted to the hospital for urgent workup with lumbar puncture and possibly IV penicillin. -     HIV antibody; Future -     RPR; Future  Rash- his sedimentation rates normal so don't think this is vasculitis, this could possibly be a drug reaction to Truvada so for now will discontinue that. His CBC and CMP are normal so I don't think he has any systemic involvement. Will also check his RPR to see if this is an unusual presentation for secondary syphilis. -     Comprehensive metabolic panel; Future -     CBC with Differential/Platelet; Future -     HIV 1 RNA quant-no reflex-bld; Future -     HIV antibody; Future -     Sedimentation rate; Future  Urethritis- his urinalysis is normal, I will screen for gonorrhea and chlamydia. -     Urinalysis, Routine w reflex microscopic; Future -     GC/Chlamydia Probe Amp; Future  Acute conjunctivitis of both eyes, unspecified acute conjunctivitis type- he has not responded much to topical fluoroquinolones and systemic cephalosporin, I am concerned that this  may be ocular syphilis and will proceed further with evaluation and possible treatment.   I am having Mr. Phoebe Per[place]zaretta maintain his Adalimumab, mesalamine, LEVITRA, naproxen sodium, ALPRAZolam, sertraline, amoxicillin-clavulanate, amphetamine-dextroamphetamine, valACYclovir, emtricitabine-tenofovir, cefdinir, and moxifloxacin.  Meds ordered this encounter  Medications  . cefdinir (OMNICEF) 300 MG capsule  . moxifloxacin (VIGAMOX) 0.5 % ophthalmic solution     Follow-up: Return in about 1 day (around 05/31/2016).  Sanda Lingerhomas Rai Sinagra, MD

## 2016-05-30 NOTE — Patient Instructions (Signed)
Rash A rash is a change in the color of the skin. A rash can also change the way your skin feels. There are many different conditions and factors that can cause a rash. Follow these instructions at home: Pay attention to any changes in your symptoms. Follow these instructions to help with your condition: Medicine Take or apply over-the-counter and prescription medicines only as told by your health care provider. These may include:  Corticosteroid cream.  Anti-itch lotions.  Oral antihistamines.  Skin Care  Apply cool compresses to the affected areas.  Try taking a bath with: ? Epsom salts. Follow the instructions on the packaging. You can get these at your local pharmacy or grocery store. ? Baking soda. Pour a small amount into the bath as told by your health care provider. ? Colloidal oatmeal. Follow the instructions on the packaging. You can get this at your local pharmacy or grocery store.  Try applying baking soda paste to your skin. Stir water into baking soda until it reaches a paste-like consistency.  Do not scratch or rub your skin.  Avoid covering the rash. Make sure the rash is exposed to air as much as possible. General instructions  Avoid hot showers or baths, which can make itching worse. A cold shower may help.  Avoid scented soaps, detergents, and perfumes. Use gentle soaps, detergents, perfumes, and other cosmetic products.  Avoid any substance that causes your rash. Keep a journal to help track what causes your rash. Write down: ? What you eat. ? What cosmetic products you use. ? What you drink. ? What you wear. This includes jewelry.  Keep all follow-up visits as told by your health care provider. This is important. Contact a health care provider if:  You sweat at night.  You lose weight.  You urinate more than normal.  You feel weak.  You vomit.  Your skin or the whites of your eyes look yellow (jaundice).  Your skin: ? Tingles. ? Is  numb.  Your rash: ? Does not go away after several days. ? Gets worse.  You are: ? Unusually thirsty. ? More tired than normal.  You have: ? New symptoms. ? Pain in your abdomen. ? A fever. ? Diarrhea. Get help right away if:  You develop a rash that covers all or most of your body. The rash may or may not be painful.  You develop blisters that: ? Are on top of the rash. ? Grow larger or grow together. ? Are painful. ? Are inside your nose or mouth.  You develop a rash that: ? Looks like purple pinprick-sized spots all over your body. ? Has a "bull's eye" or looks like a target. ? Is not related to sun exposure, is red and painful, and causes your skin to peel. This information is not intended to replace advice given to you by your health care provider. Make sure you discuss any questions you have with your health care provider. Document Released: 04/01/2002 Document Revised: 09/15/2015 Document Reviewed: 08/27/2014 Elsevier Interactive Patient Education  2017 [employer]  

## 2016-05-30 NOTE — Progress Notes (Signed)
Patient ID: Keith Walker, male   DOB: 1985/08/11, 31 y.o.   MRN: [id number]     [facility]      Reason for Consult:rash, conjunctivitis    Referring Physician: Dr. Yetta Barre    Patient ID: Keith Walker, male    DOB: October 04, 1985, 31 y.o.   MRN: [id number]  HPI:   He comes in for a new patient evaluation for complaints of rash, bilateral conjunctivitis, and concern for syphilis.  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  At the time was not having any specific symptoms including no rash.  He endorses MSM and often without condoms.  Currently with one new sexual partner.  Travels frequently for work.  Rash started about 3 weeks ago, no recent medications changes/additions.  Developed conjunctivitis as well, bilateral.  Also with intermittent penile discharge not associated with urethritis, no pyuria.  Was found to have minimally reactive pupils to light.  No sore throat, no lymphadenopathy, no penile lesions.  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  History of rheumatic disease.  Labs sent today for RPR, hiv, HIV rna, GC/chlamydia.  On Truvada for PrEP.  His partner is also on PrEP and has been tested last week for GC/chlamydia.  He also complains of HSV2 lesion.  Showed me a picture.  Takes suppressive Valtrex bid.   Travels for work and leaves in 3 days for an extended period.  Independently reviewed recent MRI and no inflammation noted.   Past Medical History:  Diagnosis Date  . Arthritis   . Behcet's disease (HCC)   . Blood in stool   . Chicken pox   . Fibromyalgia   . GERD (gastroesophageal reflux disease)   . Hemorrhoids   . Inguinal hernia    bilateral  . Insomnia    d/t pain  . Sleep apnea    does not use cpap  . Ulcer (HCC)   . Ulcerative colitis      Prior to Admission medications   Medication Sig Start Date End Date Taking? Authorizing Provider  Adalimumab (HUMIRA PEN) 40 MG/0.8ML PNKT Inject 40 mg into the muscle every 14 (fourteen) days. Patient adminsters IM himself   Yes Historical Provider, MD  ALPRAZolam Prudy Feeler) 1 MG tablet Take 1 tablet (1 mg total) by mouth 2 (two) times daily as needed for anxiety or sleep. 01/28/16  Yes Etta Grandchild, MD  amphetamine-dextroamphetamine (ADDERALL) 20 MG tablet Take 1 tablet (20 mg total) by mouth daily. 04/11/16  Yes Etta Grandchild, MD  cefdinir (OMNICEF) 300 MG capsule  05/28/16  Yes Historical Provider, MD  emtricitabine-tenofovir (TRUVADA) 200-300 MG tablet Take 1 tablet by mouth daily. 05/10/16  Yes Etta Grandchild, MD  mesalamine (LIALDA) 1.2 G EC tablet Take 4 tablets (4.8 g total) by mouth daily with breakfast. Patient taking differently: Take 2.4 g by mouth daily with breakfast.  09/23/14  Yes Beverley Fiedler, MD  moxifloxacin (VIGAMOX) 0.5 % ophthalmic solution  05/28/16  Yes Historical Provider, MD  naproxen sodium (ANAPROX) 220 MG tablet Take 440 mg by mouth 2 (two) times daily as needed (pain).   Yes Historical Provider, MD  sertraline (ZOLOFT) 50 MG tablet Take 1 tablet (50 mg total) by mouth at bedtime. 03/15/16  Yes Etta Grandchild, MD  valACYclovir (VALTREX) 500 MG tablet Take 1  tablet (500 mg total) by mouth 2 (two) times daily. 04/27/16  Yes Etta Grandchildhomas L Jones, MD  Camphor 0.45 % GEL Apply 1 application topically 2 (two) times daily. 05/30/16   Gardiner Barefootobert W Unice Vantassel, MD  trimethoprim-polymyxin b (POLYTRIM) ophthalmic solution Place 2 drops into both eyes every 4 (four) hours. While awake 05/30/16   Gardiner Barefootobert W Kielan Dreisbach, MD    No Known Allergies  Social History  Substance Use Topics  . Smoking status: Current Every Day Smoker    Packs/day: 0.25    Years: 8.00    Types: Cigarettes  . Smokeless tobacco: Former NeurosurgeonUser    Types: Snuff    Quit date: 06/24/2014  . Alcohol use 0.6 oz/week    1 Glasses of wine per  week     Comment: occasionally     Family History  Problem Relation Age of Onset  . Breast cancer Mother   . Hyperlipidemia Maternal Grandmother   . Heart disease Maternal Grandmother   . Diverticulitis Maternal Grandmother   . Lung cancer Maternal Grandfather   . Heart disease Paternal Grandfather   . Heart disease Paternal Grandmother   . Diabetes Maternal Aunt   . Heart disease Paternal Uncle   . Healthy Brother     x3  . Healthy Sister     x2  . Other Father     hypogonadism  . Colon cancer Neg Hx     Review of Systems  Constitutional: negative for fevers, chills and weight loss Eyes: negative for visual disturbance Ears, nose, mouth, throat, and face: negative for nasal congestion, sore mouth and sore throat Respiratory: negative for cough, sputum or wheezing Hematologic/lymphatic: negative for lymphadenopathy Musculoskeletal: negative for myalgias and arthralgias All other systems reviewed and are negative    Constitutional: in no apparent distress and alert  Vitals:   05/30/16 1348  BP: 133/88  Pulse: (!) 103   EYES: anicteric, bilateral conjunctivitis; pupils normal size, reactive to light equal bilateral ENMT: no thrush, streaky erythema  Cardiovascular: Cor RRR Respiratory: CTA B; normal respiratory effort GI: Bowel sounds are normal, liver is not enlarged, spleen is not enlarged Musculoskeletal: no pedal edema noted Skin: diffuse papillary rash on arms, torso pruritic Hematologic: no cervical, no supraclavicular, no inguinal lad GU: no discharge, no penile lesions  Labs: Lab Results  Component Value Date   WBC 6.3 05/30/2016   HGB 15.9 05/30/2016   HCT 44.7 05/30/2016   MCV 96.8 05/30/2016   PLT 221.0 05/30/2016    Lab Results  Component Value Date   CREATININE 0.86 05/30/2016   BUN 12 05/30/2016   NA 138 05/30/2016   K 4.3 05/30/2016   CL 102 05/30/2016   CO2 29 05/30/2016    Lab Results  Component Value Date   ALT 13 05/30/2016   AST  15 05/30/2016   ALKPHOS 58 05/30/2016   BILITOT 0.6 05/30/2016     Assessment: conjunctivitis with rash, etiology unclear.  I think syphilis-related illness is much less likely since he has a rash that is pruritic and conjunctivitis, which is not typical for syphilis. Though neurosyphilis/ocular syphilis can come at any time in the illness, it is more typically associated with blurry vision, vision changes, no conjunctivitis.  I did not appreciate Argyl-Robertson pupils on my exam.  It is possible that this could be gonorrhea with conjunctivitis which would require direct inoculation of the eyes associated with penile discharge but not typical either.  Possible rheumatic disease.     Plan: 1)  follow lab tests done today 2) will give empiric treatment for GC 3) topical benadryl 4) ocular treatment for conjunctivitis to see if it resolves  He is going to contact me via MyChart next week if he has no resolution or sooner as needed

## 2016-05-31 ENCOUNTER — Encounter: Payer: Self-pay | Admitting: Internal Medicine

## 2016-05-31 ENCOUNTER — Other Ambulatory Visit: Payer: Self-pay | Admitting: Internal Medicine

## 2016-05-31 LAB — RPR

## 2016-05-31 LAB — HIV ANTIBODY (ROUTINE TESTING W REFLEX): HIV 1&2 Ab, 4th Generation: NONREACTIVE

## 2016-05-31 LAB — GC/CHLAMYDIA PROBE AMP
CT Probe RNA: NOT DETECTED
GC Probe RNA: NOT DETECTED

## 2016-05-31 MED ORDER — FAMCICLOVIR 250 MG PO TABS: 250.0000 mg | ORAL_TABLET | Freq: Two times a day (BID) | ORAL | 5 refills | Status: AC

## 2016-06-01 ENCOUNTER — Other Ambulatory Visit: Payer: Self-pay | Admitting: Internal Medicine

## 2016-06-01 DIAGNOSIS — B86 Scabies: Secondary | ICD-10-CM

## 2016-06-01 LAB — HIV-1 RNA QUANT-NO REFLEX-BLD
HIV 1 RNA QUANT: NOT DETECTED {copies}/mL
HIV-1 RNA QUANT, LOG: NOT DETECTED {Log_copies}/mL

## 2016-06-01 MED ORDER — PERMETHRIN 5 % EX CREA: 1.0000 "application " | TOPICAL_CREAM | Freq: Once | CUTANEOUS | 1 refills | Status: AC

## 2016-06-08 ENCOUNTER — Encounter: Payer: Self-pay | Admitting: Internal Medicine

## 2016-06-09 ENCOUNTER — Ambulatory Visit (INDEPENDENT_AMBULATORY_CARE_PROVIDER_SITE_OTHER): Payer: 59 | Admitting: Internal Medicine

## 2016-06-09 ENCOUNTER — Encounter: Payer: Self-pay | Admitting: Internal Medicine

## 2016-06-09 VITALS — BP 116/88 | HR 95 | Temp 98.4°F | Resp 16 | Ht 66.0 in | Wt 191.1 lb

## 2016-06-09 DIAGNOSIS — A539 Syphilis, unspecified: Secondary | ICD-10-CM | POA: Diagnosis not present

## 2016-06-09 DIAGNOSIS — L270 Generalized skin eruption due to drugs and medicaments taken internally: Secondary | ICD-10-CM | POA: Diagnosis not present

## 2016-06-09 DIAGNOSIS — F900 Attention-deficit hyperactivity disorder, predominantly inattentive type: Secondary | ICD-10-CM

## 2016-06-09 DIAGNOSIS — T3795XA Adverse effect of unspecified systemic anti-infective and antiparasitic, initial encounter: Secondary | ICD-10-CM

## 2016-06-09 DIAGNOSIS — B09 Unspecified viral infection characterized by skin and mucous membrane lesions: Secondary | ICD-10-CM | POA: Diagnosis not present

## 2016-06-09 MED ORDER — HYDROXYZINE HCL 25 MG PO TABS: 25.0000 mg | ORAL_TABLET | Freq: Three times a day (TID) | ORAL | 0 refills | Status: DC | PRN

## 2016-06-09 MED ORDER — TRIAMCINOLONE ACETONIDE 0.5 % EX CREA: 1.0000 "application " | TOPICAL_CREAM | Freq: Three times a day (TID) | CUTANEOUS | 1 refills | Status: AC

## 2016-06-09 MED ORDER — METHYLPREDNISOLONE ACETATE 80 MG/ML IJ SUSP
120.0000 mg | Freq: Once | INTRAMUSCULAR | Status: AC
  Administered 2016-06-09: 120 mg via INTRAMUSCULAR

## 2016-06-09 MED ORDER — AMPHETAMINE-DEXTROAMPHETAMINE 20 MG PO TABS: 20.0000 mg | ORAL_TABLET | Freq: Every day | ORAL | 0 refills | Status: DC

## 2016-06-09 NOTE — Patient Instructions (Signed)
Rash A rash is a change in the color of the skin. A rash can also change the way your skin feels. There are many different conditions and factors that can cause a rash. Follow these instructions at home: Pay attention to any changes in your symptoms. Follow these instructions to help with your condition: Medicine Take or apply over-the-counter and prescription medicines only as told by your health care provider. These may include:  Corticosteroid cream.  Anti-itch lotions.  Oral antihistamines.  Skin Care  Apply cool compresses to the affected areas.  Try taking a bath with: ? Epsom salts. Follow the instructions on the packaging. You can get these at your local pharmacy or grocery store. ? Baking soda. Pour a small amount into the bath as told by your health care provider. ? Colloidal oatmeal. Follow the instructions on the packaging. You can get this at your local pharmacy or grocery store.  Try applying baking soda paste to your skin. Stir water into baking soda until it reaches a paste-like consistency.  Do not scratch or rub your skin.  Avoid covering the rash. Make sure the rash is exposed to air as much as possible. General instructions  Avoid hot showers or baths, which can make itching worse. A cold shower may help.  Avoid scented soaps, detergents, and perfumes. Use gentle soaps, detergents, perfumes, and other cosmetic products.  Avoid any substance that causes your rash. Keep a journal to help track what causes your rash. Write down: ? What you eat. ? What cosmetic products you use. ? What you drink. ? What you wear. This includes jewelry.  Keep all follow-up visits as told by your health care provider. This is important. Contact a health care provider if:  You sweat at night.  You lose weight.  You urinate more than normal.  You feel weak.  You vomit.  Your skin or the whites of your eyes look yellow (jaundice).  Your skin: ? Tingles. ? Is  numb.  Your rash: ? Does not go away after several days. ? Gets worse.  You are: ? Unusually thirsty. ? More tired than normal.  You have: ? New symptoms. ? Pain in your abdomen. ? A fever. ? Diarrhea. Get help right away if:  You develop a rash that covers all or most of your body. The rash may or may not be painful.  You develop blisters that: ? Are on top of the rash. ? Grow larger or grow together. ? Are painful. ? Are inside your nose or mouth.  You develop a rash that: ? Looks like purple pinprick-sized spots all over your body. ? Has a "bull's eye" or looks like a target. ? Is not related to sun exposure, is red and painful, and causes your skin to peel. This information is not intended to replace advice given to you by your health care provider. Make sure you discuss any questions you have with your health care provider. Document Released: 04/01/2002 Document Revised: 09/15/2015 Document Reviewed: 08/27/2014 Elsevier Interactive Patient Education  2017 [employer]  

## 2016-06-09 NOTE — Progress Notes (Signed)
Pre visit review using our clinic review tool, if applicable. No additional management support is needed unless otherwise documented below in the visit note. 

## 2016-06-09 NOTE — Progress Notes (Signed)
Subjective:  Patient ID: Keith Walker, male    DOB: 1985/06/23  Age: 31 y.o. MRN: [id number]  CC: Rash   HPI Keith Walker presents for Concerns about a rash. He was seen about 2 weeks ago for conjunctivitis and rash and returned stating that most of his symptoms have resolved but the rash is not improving. His RPR was negative so this is not secondary syphilis. His sedimentation rate was normal so I don't think this is vasculitis or rheumatologic disease. There were no eosinophils on his CBC so I don't think this is allergic. He has been on Truvada for a year now so it's unlikely that this is Truvada. He has been treated for scabies and says not only did that not help but it made the rash a little worse. The rash has spread to his lower extremities but is also located on his arms and torso. It is mildly pruritic and stinging. He has no involvement in his mouth, throat, or other mucous membranes. He has noticed one prominent area on the left side of his upper intergluteal fold.  Pt states ADD status overall stable on current meds with overall good compliance and tolerability, and good effectiveness with respect to ability for concentration and task completion.  Outpatient Medications Prior to Visit  Medication Sig Dispense Refill  . Adalimumab (HUMIRA PEN) 40 MG/0.8ML PNKT Inject 40 mg into the muscle every 14 (fourteen) days. Patient adminsters IM himself    . ALPRAZolam (XANAX) 1 MG tablet Take 1 tablet (1 mg total) by mouth 2 (two) times daily as needed for anxiety or sleep. 60 tablet 3  . famciclovir (FAMVIR) 250 MG tablet Take 1 tablet (250 mg total) by mouth 2 (two) times daily. 60 tablet 5  . sertraline (ZOLOFT) 50 MG tablet Take 1 tablet (50 mg total) by mouth at bedtime. 90 tablet 1  . valACYclovir (VALTREX) 500 MG tablet Take 1 tablet (500 mg total) by mouth 2 (two) times daily. 90 tablet 3  . amphetamine-dextroamphetamine (ADDERALL) 20 MG tablet Take 1 tablet (20 mg total)  by mouth daily. 30 tablet 0  . emtricitabine-tenofovir (TRUVADA) 200-300 MG tablet Take 1 tablet by mouth daily. 90 tablet 1  . Camphor 0.45 % GEL Apply 1 application topically 2 (two) times daily. 85 g 1  . cefdinir (OMNICEF) 300 MG capsule     . mesalamine (LIALDA) 1.2 G EC tablet Take 4 tablets (4.8 g total) by mouth daily with breakfast. (Patient taking differently: Take 2.4 g by mouth daily with breakfast. ) 120 tablet 11  . moxifloxacin (VIGAMOX) 0.5 % ophthalmic solution     . naproxen sodium (ANAPROX) 220 MG tablet Take 440 mg by mouth 2 (two) times daily as needed (pain).    Marland Kitchen trimethoprim-polymyxin b (POLYTRIM) ophthalmic solution Place 2 drops into both eyes every 4 (four) hours. While awake 10 mL 0   No facility-administered medications prior to visit.     ROS Review of Systems  Constitutional: Negative for appetite change, chills, diaphoresis, fatigue and fever.  HENT: Negative.  Negative for facial swelling, sore throat, trouble swallowing and voice change.   Eyes: Negative for photophobia, pain, redness, itching and visual disturbance.  Respiratory: Negative.  Negative for cough, chest tightness, shortness of breath, wheezing and stridor.   Cardiovascular: Negative for chest pain, palpitations and leg swelling.  Gastrointestinal: Negative for abdominal pain, constipation, diarrhea, nausea and vomiting.  Endocrine: Negative.   Genitourinary: Negative for difficulty urinating, genital sores, hematuria, penile pain,  penile swelling, scrotal swelling, testicular pain and urgency.  Musculoskeletal: Negative for back pain, myalgias and neck pain.  Skin: Positive for rash.  Allergic/Immunologic: Negative.   Neurological: Negative.  Negative for dizziness and headaches.  Hematological: Negative.  Negative for adenopathy. Does not bruise/bleed easily.  Psychiatric/Behavioral: Negative.     Objective:  BP 116/88 (BP Location: Left Arm, Patient Position: Sitting, Cuff Size: Normal)    Pulse 95   Temp 98.4 F (36.9 C) (Oral)   Resp 16   Ht 5\' 6"  (1.676 m)   Wt 191 lb 1.3 oz (86.7 kg)   SpO2 98%   BMI 30.84 kg/m   BP Readings from Last 3 Encounters:  06/09/16 116/88  05/30/16 133/88  05/30/16 (!) 150/98    Wt Readings from Last 3 Encounters:  06/09/16 191 lb 1.3 oz (86.7 kg)  05/30/16 187 lb (84.8 kg)  05/30/16 188 lb (85.3 kg)    Physical Exam  Constitutional: He is oriented to person, place, and time. No distress.  HENT:  Mouth/Throat: Oropharynx is clear and moist. No oropharyngeal exudate.  Eyes: Conjunctivae are normal. Right eye exhibits no discharge. Left eye exhibits no discharge. No scleral icterus.  Neck: Normal range of motion. Neck supple. No JVD present. No tracheal deviation present. No thyromegaly present.  Cardiovascular: Normal rate, regular rhythm, normal heart sounds and intact distal pulses.  Exam reveals no gallop and no friction rub.   No murmur heard. Pulmonary/Chest: Effort normal and breath sounds normal. No stridor. No respiratory distress. He has no wheezes. He has no rales. He exhibits no tenderness.  Abdominal: Soft. Bowel sounds are normal. He exhibits no distension and no mass. There is no tenderness. There is no rebound and no guarding.  Musculoskeletal: Normal range of motion. He exhibits no edema, tenderness or deformity.  Lymphadenopathy:    He has no cervical adenopathy.  Neurological: He is oriented to person, place, and time.  Skin: Skin is warm and dry. Rash noted. No bruising, no ecchymosis, no lesion, no petechiae and no purpura noted. Rash is maculopapular. Rash is not macular, not papular, not nodular, not pustular, not vesicular and not urticarial. He is not diaphoretic. No erythema. No pallor.  He has a diffuse, symmetrical macular papular eruption involving the upper extremities, torso, and lower extremities. The lesions show blanching erythema with scale and now some crusting. There is no involvement of mucous  membranes and there are no targets or vesicles. There is no involvement of the axilla, periumbilical region, or groin.  Just to the left side of the intergluteal fold there is what appears to be a herald patch with a rhomboid shape area of erythema and scale.  Psychiatric: He has a normal mood and affect. His behavior is normal. Judgment and thought content normal.  Vitals reviewed.   Lab Results  Component Value Date   WBC 6.3 05/30/2016   HGB 15.9 05/30/2016   HCT 44.7 05/30/2016   PLT 221.0 05/30/2016   GLUCOSE 94 05/30/2016   CHOL 167 02/20/2012   TRIG 163 (H) 02/20/2012   HDL 43 02/20/2012   LDLCALC 91 02/20/2012   ALT 13 05/30/2016   AST 15 05/30/2016   NA 138 05/30/2016   K 4.3 05/30/2016   CL 102 05/30/2016   CREATININE 0.86 05/30/2016   BUN 12 05/30/2016   CO2 29 05/30/2016   TSH 1.50 06/12/2014    Keith Keith Peerntero Abd W/o W/cm  Result Date: 06/02/2015 CLINICAL DATA:  Crohn's disease, usually with left-sided  pain, now with right and transverse colonic pain, evaluate for progression of disease. EXAM: Keith ABDOMEN AND PELVIS WITHOUT AND WITH CONTRAST (Keith ENTEROGRAPHY) TECHNIQUE: Multiplanar, multisequence MRI of the abdomen and pelvis was performed both before and during bolus administration of intravenous contrast. Negative oral contrast VoLumen was given. CONTRAST:  20mL MULTIHANCE GADOBENATE DIMEGLUMINE 529 MG/ML IV SOLN COMPARISON:  CT abdomen pelvis dated 03/29/2011. FINDINGS: Lower chest:  Lung bases are clear. Hepatobiliary: Liver is within normal limits. No suspicious/enhancing hepatic lesions. Gallbladder is within normal limits. No intrahepatic or extrahepatic ductal dilatation. Pancreas: Within normal limits. Spleen: Within normal limits. Adrenals/Urinary Tract: Adrenal glands are within normal limits. Kidneys are within normal limits.  No hydronephrosis. Bladder is within normal limits. Stomach/Bowel: Stomach is within normal limits. No evidence of bowel obstruction. No small  bowel wall thickening or inflammatory changes. No evidence of stricture. No colonic wall thickening or inflammatory changes. No evidence of fistula or abscess. Vascular/Lymphatic: No evidence of abdominal aortic aneurysm. No suspicious abdominopelvic lymphadenopathy. Reproductive: Prostate is unremarkable. Other: No abdominopelvic ascites. Musculoskeletal: No focal osseous lesions. IMPRESSION: No evidence of acute inflammatory Crohn's disease. No evidence of bowel obstruction or stricture. Normal MRI abdomen/pelvis. Electronically Signed   By: Charline Bills M.D.   On: 06/02/2015 10:28   Keith Walker Pelvis W/o W/cm  Result Date: 06/02/2015 CLINICAL DATA:  Crohn's disease, usually with left-sided pain, now with right and transverse colonic pain, evaluate for progression of disease. EXAM: Keith ABDOMEN AND PELVIS WITHOUT AND WITH CONTRAST (Keith ENTEROGRAPHY) TECHNIQUE: Multiplanar, multisequence MRI of the abdomen and pelvis was performed both before and during bolus administration of intravenous contrast. Negative oral contrast VoLumen was given. CONTRAST:  20mL MULTIHANCE GADOBENATE DIMEGLUMINE 529 MG/ML IV SOLN COMPARISON:  CT abdomen pelvis dated 03/29/2011. FINDINGS: Lower chest:  Lung bases are clear. Hepatobiliary: Liver is within normal limits. No suspicious/enhancing hepatic lesions. Gallbladder is within normal limits. No intrahepatic or extrahepatic ductal dilatation. Pancreas: Within normal limits. Spleen: Within normal limits. Adrenals/Urinary Tract: Adrenal glands are within normal limits. Kidneys are within normal limits.  No hydronephrosis. Bladder is within normal limits. Stomach/Bowel: Stomach is within normal limits. No evidence of bowel obstruction. No small bowel wall thickening or inflammatory changes. No evidence of stricture. No colonic wall thickening or inflammatory changes. No evidence of fistula or abscess. Vascular/Lymphatic: No evidence of abdominal aortic aneurysm. No suspicious  abdominopelvic lymphadenopathy. Reproductive: Prostate is unremarkable. Other: No abdominopelvic ascites. Musculoskeletal: No focal osseous lesions. IMPRESSION: No evidence of acute inflammatory Crohn's disease. No evidence of bowel obstruction or stricture. Normal MRI abdomen/pelvis. Electronically Signed   By: Charline Bills M.D.   On: 06/02/2015 10:28    Assessment & Plan:   Keith Walker was seen today for rash.  Diagnoses and all orders for this visit:  Allergic drug rash due to anti-infective agent- I initially thought Truvada might be the causative agent but upon further review I see how rare Truvada allergy is and am now concerned that this may be a viral exanthem or pityriasis rosea. -     triamcinolone cream (KENALOG) 0.5 %; Apply 1 application topically 3 (three) times daily. -     hydrOXYzine (ATARAX/VISTARIL) 25 MG tablet; Take 1 tablet (25 mg total) by mouth every 8 (eight) hours as needed. -     methylPREDNISolone acetate (DEPO-MEDROL) injection 120 mg; Inject 1.5 mLs (120 mg total) into the muscle once.  Syphilis- his recent RPR was negative  ADHD (attention deficit hyperactivity disorder), inattentive type -  amphetamine-dextroamphetamine (ADDERALL) 20 MG tablet; Take 1 tablet (20 mg total) by mouth daily.  Viral exanthem- will treat with systemic and topical steroids as well as an antihistamine as needed.   I have discontinued Keith. Larch mesalamine, naproxen sodium, emtricitabine-tenofovir, cefdinir, moxifloxacin, trimethoprim-polymyxin b, and Camphor. I am also having him start on triamcinolone cream and hydrOXYzine. Additionally, I am having him maintain his Adalimumab, ALPRAZolam, sertraline, valACYclovir, famciclovir, and amphetamine-dextroamphetamine. We administered methylPREDNISolone acetate.  Meds ordered this encounter  Medications  . triamcinolone cream (KENALOG) 0.5 %    Sig: Apply 1 application topically 3 (three) times daily.    Dispense:  454 g     Refill:  1  . hydrOXYzine (ATARAX/VISTARIL) 25 MG tablet    Sig: Take 1 tablet (25 mg total) by mouth every 8 (eight) hours as needed.    Dispense:  75 tablet    Refill:  0  . amphetamine-dextroamphetamine (ADDERALL) 20 MG tablet    Sig: Take 1 tablet (20 mg total) by mouth daily.    Dispense:  30 tablet    Refill:  0  . methylPREDNISolone acetate (DEPO-MEDROL) injection 120 mg     Follow-up: Return in about 3 weeks (around 06/30/2016).  Sanda Linger, MD

## 2016-06-11 ENCOUNTER — Encounter: Payer: Self-pay | Admitting: Internal Medicine

## 2016-06-11 DIAGNOSIS — R21 Rash and other nonspecific skin eruption: Secondary | ICD-10-CM | POA: Insufficient documentation

## 2016-06-21 IMAGING — DX DG CHEST 2V
2 series · 2 of 2 positions shown · non-contrast
Comparison: 12/14/2014.

CLINICAL DATA: Cough.

EXAM:
CHEST  2 VIEW

[chest pa]
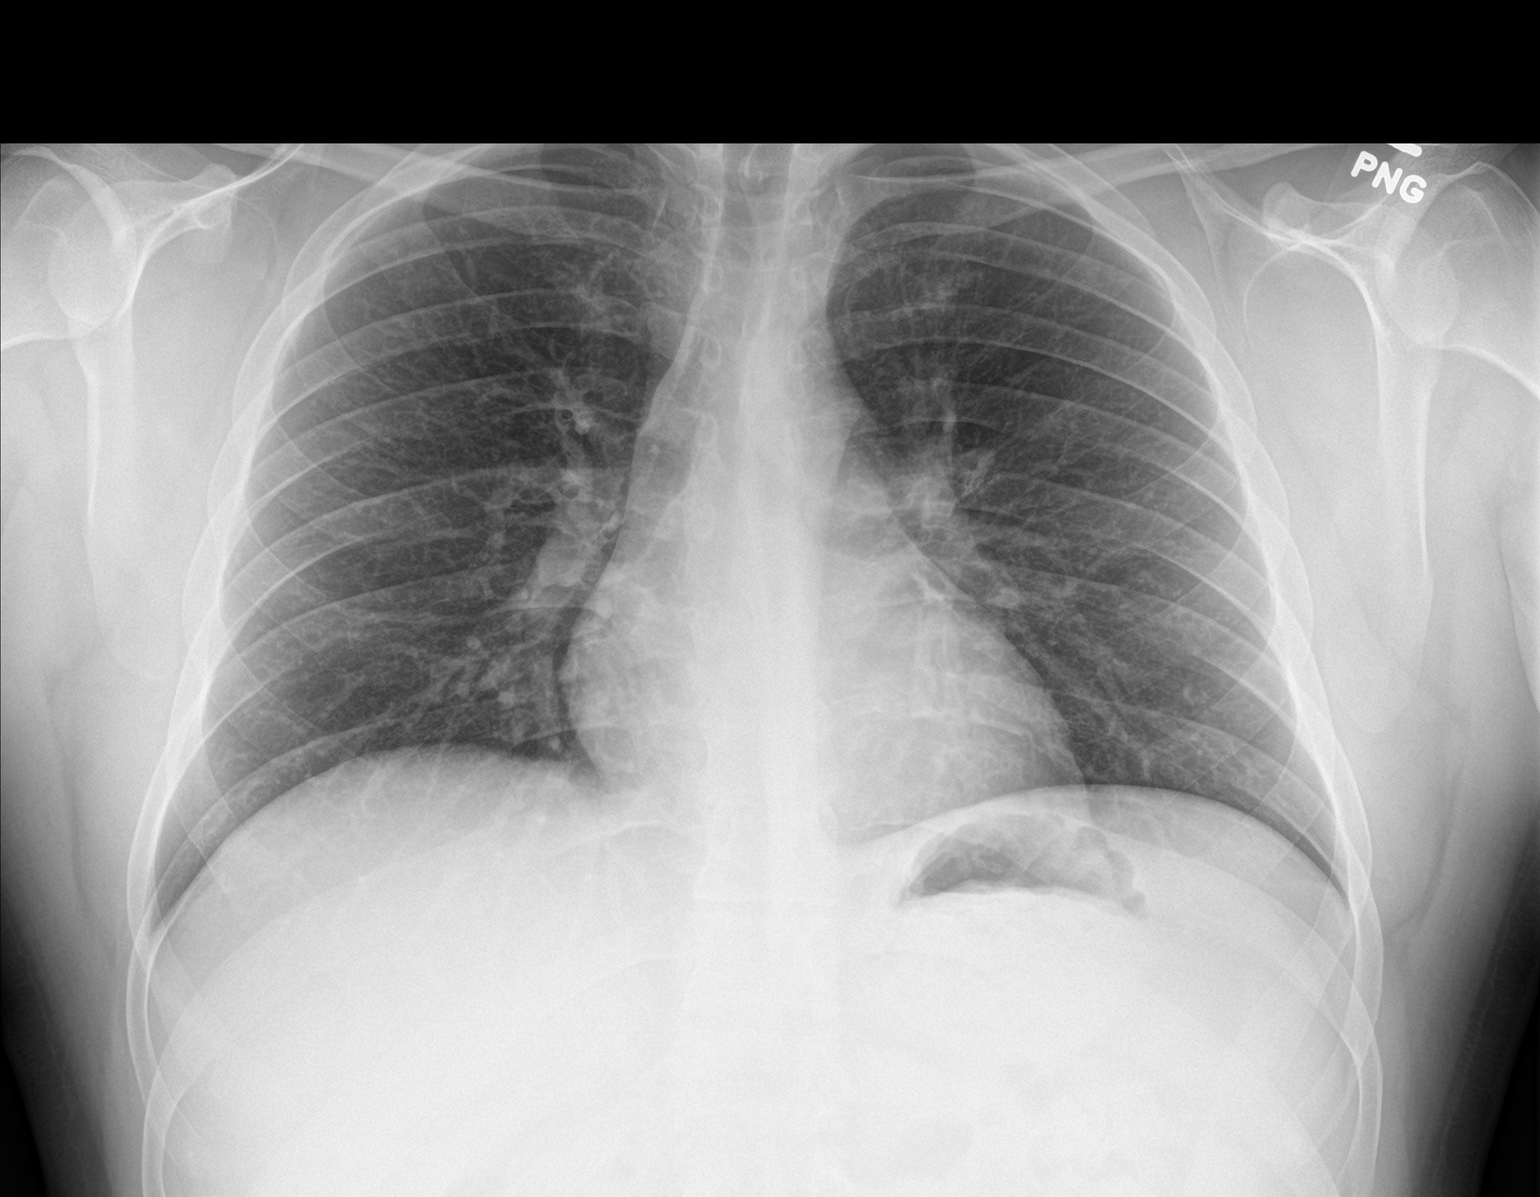

[chest lat]
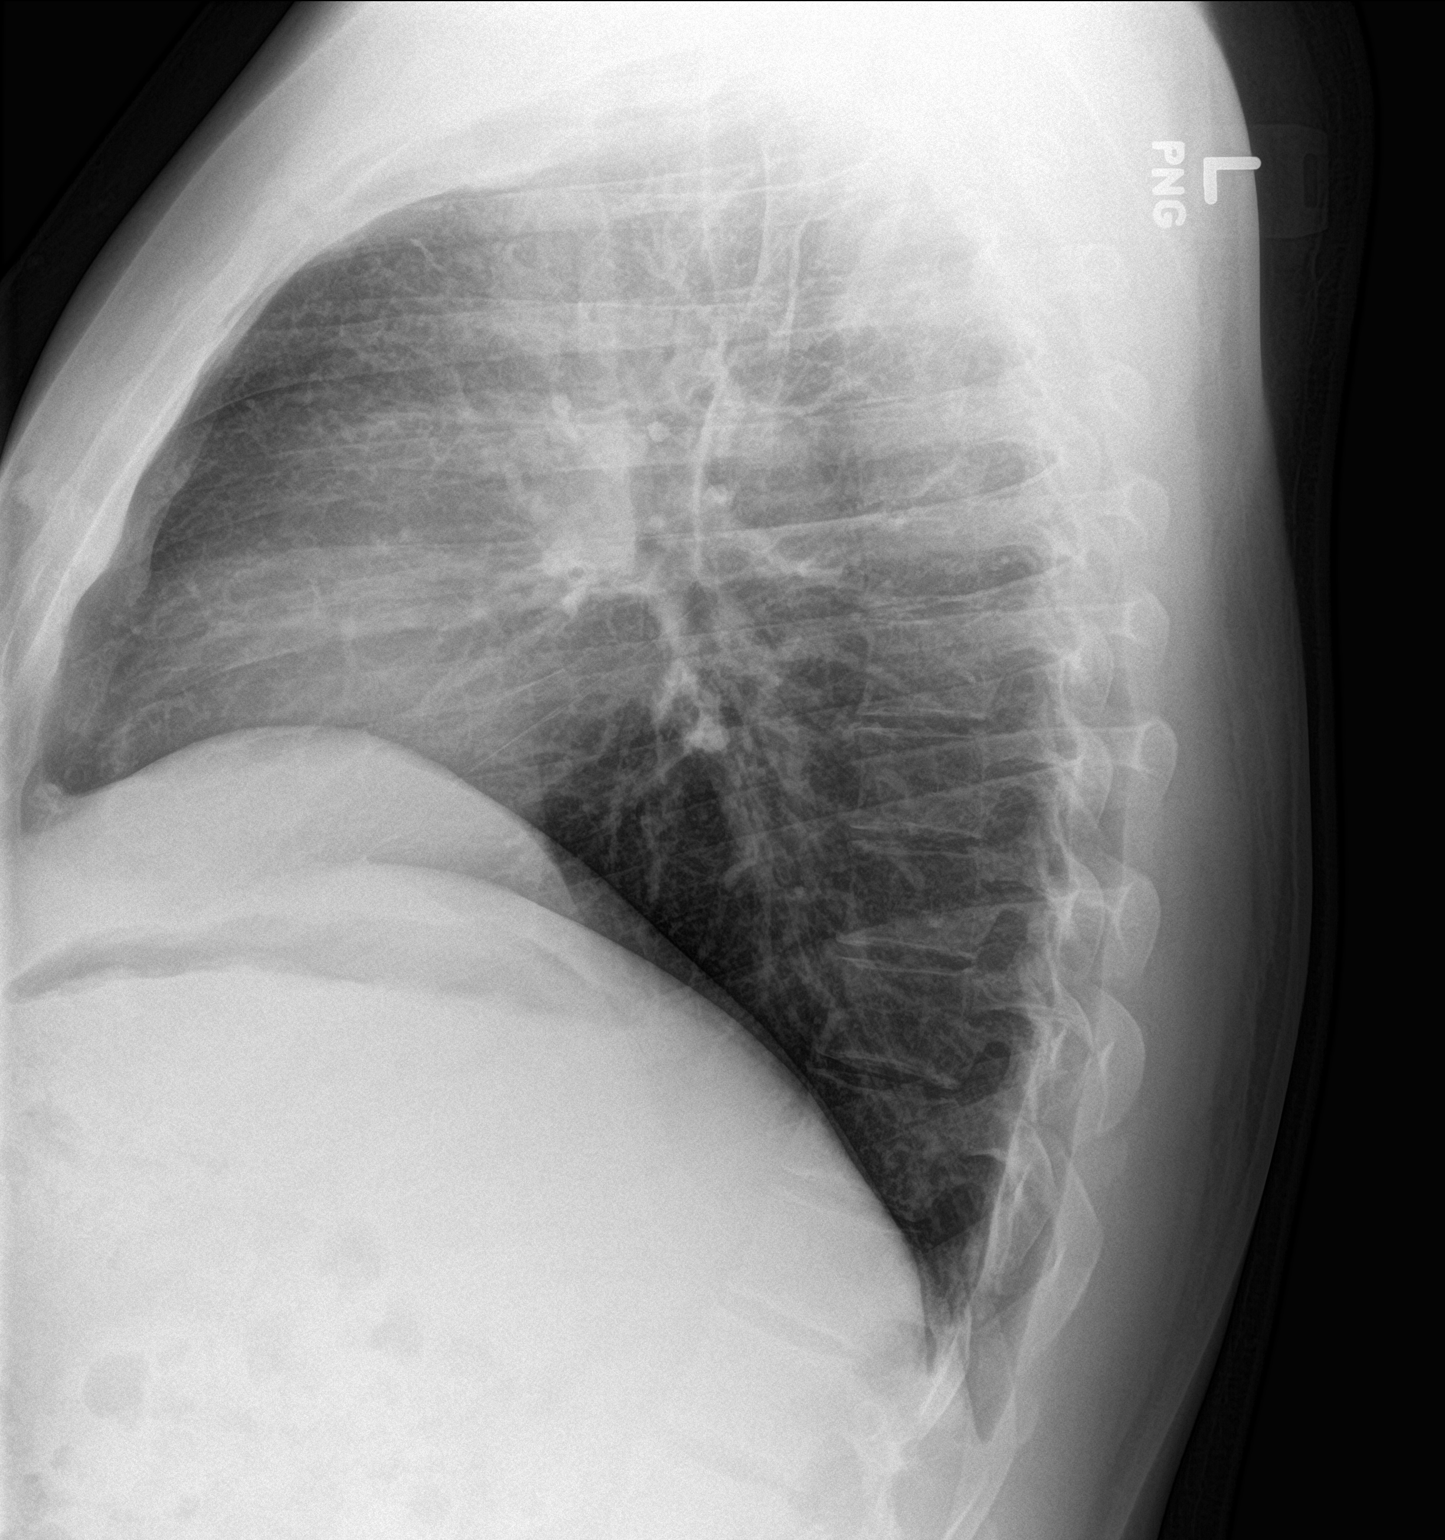

[2 of 2 positions shown; findings below may reference images not displayed]

FINDINGS: Mediastinum and hilar structures normal. Lungs are clear. Heart size
normal. No pleural effusion or pneumothorax. No acute bony
abnormality.
IMPRESSION: No active cardiopulmonary disease.

## 2016-06-22 ENCOUNTER — Encounter: Payer: Self-pay | Admitting: Internal Medicine

## 2016-06-22 ENCOUNTER — Other Ambulatory Visit: Payer: Self-pay | Admitting: Internal Medicine

## 2016-06-22 DIAGNOSIS — L301 Dyshidrosis [pompholyx]: Secondary | ICD-10-CM

## 2016-06-22 DIAGNOSIS — B09 Unspecified viral infection characterized by skin and mucous membrane lesions: Secondary | ICD-10-CM

## 2016-06-22 MED ORDER — METHYLPREDNISOLONE 4 MG PO TBPK: ORAL_TABLET | ORAL | 0 refills | Status: DC

## 2016-06-30 ENCOUNTER — Ambulatory Visit: Payer: 59 | Admitting: Internal Medicine

## 2016-07-14 ENCOUNTER — Encounter: Payer: Self-pay | Admitting: Internal Medicine

## 2016-07-15 ENCOUNTER — Encounter: Payer: Self-pay | Admitting: Sports Medicine

## 2016-07-15 ENCOUNTER — Ambulatory Visit (INDEPENDENT_AMBULATORY_CARE_PROVIDER_SITE_OTHER): Payer: 59 | Admitting: Sports Medicine

## 2016-07-15 ENCOUNTER — Ambulatory Visit (INDEPENDENT_AMBULATORY_CARE_PROVIDER_SITE_OTHER): Payer: 59

## 2016-07-15 VITALS — BP 130/90 | HR 112 | Ht 64.5 in | Wt 186.4 lb

## 2016-07-15 DIAGNOSIS — M25511 Pain in right shoulder: Secondary | ICD-10-CM

## 2016-07-15 DIAGNOSIS — G8929 Other chronic pain: Secondary | ICD-10-CM | POA: Diagnosis not present

## 2016-07-15 DIAGNOSIS — M9901 Segmental and somatic dysfunction of cervical region: Secondary | ICD-10-CM | POA: Diagnosis not present

## 2016-07-15 DIAGNOSIS — M9908 Segmental and somatic dysfunction of rib cage: Secondary | ICD-10-CM | POA: Diagnosis not present

## 2016-07-15 DIAGNOSIS — M9907 Segmental and somatic dysfunction of upper extremity: Secondary | ICD-10-CM | POA: Diagnosis not present

## 2016-07-15 DIAGNOSIS — K4090 Unilateral inguinal hernia, without obstruction or gangrene, not specified as recurrent: Secondary | ICD-10-CM | POA: Diagnosis not present

## 2016-07-15 DIAGNOSIS — M9902 Segmental and somatic dysfunction of thoracic region: Secondary | ICD-10-CM

## 2016-07-15 MED ORDER — METHYLPREDNISOLONE 4 MG PO TBPK: ORAL_TABLET | ORAL | 0 refills | Status: DC

## 2016-07-15 MED ORDER — GABAPENTIN 300 MG PO CAPS: ORAL_CAPSULE | ORAL | 1 refills | Status: AC

## 2016-07-15 NOTE — Progress Notes (Signed)
OFFICE VISIT NOTE Keith FellsMichael D. Delorise Shinerigby, DO  [facility] [place] [phone]  Erasmo Leventhalickolas Macnaughton - 31 y.o. male MRN [id number]  Date of birth: 01/11/1986  Visit Date: 07/15/2016  PCP: Sanda Lingerhomas Jones, MD   Referred by: Etta GrandchildJones, Thomas L, MD  SUBJECTIVE:   Chief Complaint  Patient presents with  . Pain in RT shoulder blade    No injury. Sx started x6 months ago. Intermittent sharp pain. Unsure of triggers. He tried ice, heat, advil, and old muscle relaxers with no relief. Pain will radiate to RT anterior shoulder. Sharp Pain does last 20-30 seconds then dulls out.   . right inguinal pain    previously told he had a hernia. Worsening pain over the past several weeks   HPI: Patient presents with the above complaints.  Pain has been progressively worsening over the past.  He has had a fairly complex past 6 months with a worsening rash as well as their evaluation following diagnosis of this.  He has had a repeat RPR that is negative.  Reports the pain in the shoulder is periscapular in nature and does not radiate.  No significant nausea, vomiting, fevers, chills, headache, changes in vision. ROS:  Otherwise per HPI.  HISTORY & PERTINENT PRIOR DATA:  No specialty comments available. He reports that he has quit smoking. His smoking use included Cigarettes. He has a 2.50 pack-year smoking history. He quit smokeless tobacco use about 2 years ago. His smokeless tobacco use included Snuff. No results for input(s): HGBA1C, LABURIC in the last 8760 hours. Medications & Allergies reviewed per EMR Patient Active Problem List   Diagnosis Date Noted  . Chronic right shoulder pain 07/25/2016  . Headache 07/21/2016  . Rash and nonspecific skin eruption 06/11/2016  . Insomnia 03/16/2016  . 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 Syphilis 01/31/2016  . High risk sexual behavior 01/28/2016  . Exposure to sexually transmitted disease (STD) 01/28/2016  .  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 Allergic drug rash due to anti-infective agent 09/10/2014  . Eczema, dyshidrotic 09/10/2014  . Hypogonadism in male 06/16/2014  . ADHD (attention deficit hyperactivity disorder), inattentive type 06/12/2014  . Routine screening for STI (sexually transmitted infection) 12/26/2013  . Asthma due to seasonal allergies 08/07/2013  . Prophylactic measure 06/20/2013  . OSA (obstructive sleep apnea) 04/03/2012  . Annual physical exam 02/22/2012  . Urethritis 02/20/2011  . Depression with anxiety 09/26/2010  . Ulcerative colitis (HCC) 08/22/2010   Past Medical History:  Diagnosis Date  . Arthritis   . Behcet's disease (HCC)   . Blood in stool   . Chicken pox   . Fibromyalgia   . GERD (gastroesophageal reflux disease)   . Hemorrhoids   . Inguinal hernia    bilateral  . Insomnia    d/t pain  . Sleep apnea    does not use cpap  . Ulcer (HCC)   . Ulcerative colitis    Family History  Problem Relation Age of Onset  . Breast cancer Mother   . Hyperlipidemia Maternal Grandmother   . Heart disease Maternal Grandmother   . Diverticulitis Maternal Grandmother   . Lung cancer Maternal Grandfather   . Heart disease Paternal Grandfather   . Heart disease Paternal Grandmother   . Diabetes Maternal Aunt   . Heart disease Paternal Uncle   . Healthy Brother     x3  . Healthy Sister     x2  . Other Father  hypogonadism  . Colon cancer Neg Hx    Past Surgical History:  Procedure Laterality Date  . COLONOSCOPY    . LASIK Bilateral   . PLACEMENT OF SETON N/A 10/10/2014   Procedure: PLACEMENT OF SETON;  Surgeon: Romie Levee, MD;  Location: [facility];  Service: General;  Laterality: N/A;  . RECTAL EXAM UNDER ANESTHESIA N/A 10/10/2014   Procedure: RECTAL EXAM UNDER ANESTHESIA;  Surgeon: Romie Levee, MD;  Location: [facility];  Service: General;  Laterality: N/A;  . WISDOM TOOTH EXTRACTION     Social History   Occupational History  . business  Marine scientist   Social History Main Topics  . Smoking status: Former Smoker    Packs/day: 0.25    Years: 10.00    Types: Cigarettes  . Smokeless tobacco: Former Neurosurgeon    Types: Snuff    Quit date: 06/24/2014  . Alcohol use 0.6 oz/week    1 Glasses of wine per week     Comment: occasionally   . Drug use: No  . Sexual activity: Yes    Birth control/ protection: Condom     Comment: male    OBJECTIVE:  VS:  HT:5' 4.5" (163.8 cm)   WT:186 lb 6.4 oz (84.6 kg)  BMI:31.6    BP:130/90  HR:(!) 112bpm  TEMP: ( )  RESP:98 % PHYSICAL EXAM: General:   WDWN, NAD, Non-toxic appearing Psych:  Alert & appropriately interactive  Not depressed or anxious appearing Skin:  Generalized rash across the arms and upper torso is consistent with drug rash versus autoimmune etiology.  No open sores.  No ulcerations. Right Shoulder:   Normal alignment. No significant deformity.   Non tender over clavical, AC Joint, Anterior Shoulder. TTP over periscapular region  FROM  Normal strength with IR/ER, Emptycan, Speeds, Yergasons  Negative Hawkins/Neers  Neck:   Well aligned, no significant torticollis  No significant midline tenderness.    TTP over levator, rhomboids, trapezius ROM: Flexion: 60 Extension: 50   Right Left  Rotation: 40 70  Sidebending: 20 25   NEURAL TENSION SIGNS Right Left  Brachial Plexus Squeeze:  Non-tender  Non-tender  Arm Squeeze Test:  Non-tender  Non-tender  Spurling's  Compression Test: Minimal pain Minimal Pain  Lhermitte's  Compression test:   Negative/  No radiation  Negative/  No radiation   REFLEXES Right Left  DTR - C5 -Biceps   2+/4  2+/4  DTR - C6 - Brachiorad  2+/4  2+/4  DTR - C7 - Triceps  2+/4  2+/4   UMN - Hoffman's  Negative/Normal  Negative/Normal  UMN - Pectoral  Negative/Normal  Negative/Normal     OSTEOPATHIC/STRUCTURAL EXAM:    OA FRR  C2-C4 FRRSR  R Rib 1 Elevated  T1 ERS LEFT  T2-T4 NSR  Shoulder  retracted and IR   GU:  Scrotum and testicles appear normal.    Does have small inguinal hernia appreciated on the left.  Normal on the right.    No penile discharge.  No skin lesions.   IMAGING & PROCEDURES: Dg Cervical Spine 2 Or 3 Views  Result Date: 07/15/2016 CLINICAL DATA:  Right neck and shoulder pain without known injury. EXAM: CERVICAL SPINE - 2-3 VIEW COMPARISON:  None. FINDINGS: There is no evidence of cervical spine fracture or prevertebral soft tissue swelling. Alignment is normal. No other significant bone abnormalities are identified. IMPRESSION: Negative cervical spine radiographs. Electronically Signed   By: Lupita Raider, M.D.   On: 07/15/2016 15:12  Dg Shoulder Right  Result Date: 07/15/2016 CLINICAL DATA:  Right shoulder intermittent pain. EXAM: RIGHT SHOULDER - 2+ VIEW COMPARISON:  No recent prior. FINDINGS: No acute bony or joint abnormality identified. No evidence of fracture or dislocation. IMPRESSION: No acute or focal abnormality . Electronically Signed   By: Maisie Fus  Register   On: 07/15/2016 15:21   PROCEDURE NOTE : OSTEOPATHIC MANIPULATION The decision today to treat with Osteopathic Manipulative Therapy (OMT) was based on physical exam findings. Verbal consent was obtained after after explanation of risks, benefits and potential side effects, including acute pain flare, post manipulation soreness and need for repeat treatments.  If Cervical manipulation was performed additional time was spent discussing the associated minimal risk of  injury to neurovascular structures.  After consent was obtained manipulation was performed as below:            Regions treated:  Per billing codes          Techniques used:  Muscle Energy, MFR, FPR, HVLA, CS  and ART The patient tolerated the treatment well and reported Improved symptoms following treatment today. Patient was given medications, exercises, stretches and lifestyle modifications per AVS and verbally.       ASSESSMENT & PLAN:  Visit Diagnoses:  1. Chronic right shoulder pain   2. Somatic dysfunction of cervical region   3. Somatic dysfunction of thoracic region   4. Somatic dysfunction of upper extremities   5. Somatic dysfunction of rib region   6. Unilateral inguinal hernia without obstruction or gangrene, recurrence not specified    Meds:  Meds ordered this encounter  Medications  . methylPREDNISolone (MEDROL DOSEPAK) 4 MG TBPK tablet    Sig: Take by mouth as directed. Take 6 tablets on the first day prescribed then as directed.    Dispense:  21 tablet    Refill:  0  . gabapentin (NEURONTIN) 300 MG capsule    Sig: Start with 1 tab po qhs X 1 week, then increase to 1 tab po bid X 1 week then 1 tab po tid prn    Dispense:  90 capsule    Refill:  1    Orders:  Orders Placed This Encounter  Procedures  . DG Cervical Spine 2 or 3 views  . DG Shoulder Right    Follow-up: Return in about 1 week (around 07/22/2016).   Otherwise please see problem oriented charting as below.

## 2016-07-15 NOTE — Telephone Encounter (Signed)
LVM for pt to call back as soon as possible.   

## 2016-07-19 ENCOUNTER — Encounter: Payer: Self-pay | Admitting: Sports Medicine

## 2016-07-19 ENCOUNTER — Ambulatory Visit (INDEPENDENT_AMBULATORY_CARE_PROVIDER_SITE_OTHER): Payer: 59 | Admitting: Sports Medicine

## 2016-07-19 VITALS — BP 120/90 | HR 107 | Temp 98.4°F | Ht 64.5 in | Wt 186.0 lb

## 2016-07-19 DIAGNOSIS — R519 Headache, unspecified: Secondary | ICD-10-CM

## 2016-07-19 DIAGNOSIS — M4690 Unspecified inflammatory spondylopathy, site unspecified: Secondary | ICD-10-CM

## 2016-07-19 DIAGNOSIS — M542 Cervicalgia: Secondary | ICD-10-CM | POA: Diagnosis not present

## 2016-07-19 DIAGNOSIS — Z8619 Personal history of other infectious and parasitic diseases: Secondary | ICD-10-CM

## 2016-07-19 DIAGNOSIS — R21 Rash and other nonspecific skin eruption: Secondary | ICD-10-CM | POA: Diagnosis not present

## 2016-07-19 DIAGNOSIS — Z202 Contact with and (suspected) exposure to infections with a predominantly sexual mode of transmission: Secondary | ICD-10-CM

## 2016-07-19 DIAGNOSIS — F418 Other specified anxiety disorders: Secondary | ICD-10-CM | POA: Diagnosis not present

## 2016-07-19 DIAGNOSIS — R51 Headache: Secondary | ICD-10-CM | POA: Diagnosis not present

## 2016-07-19 DIAGNOSIS — K5190 Ulcerative colitis, unspecified, without complications: Secondary | ICD-10-CM | POA: Diagnosis not present

## 2016-07-19 MED ORDER — DIAZEPAM 5 MG PO TABS: 5.0000 mg | ORAL_TABLET | Freq: Two times a day (BID) | ORAL | 1 refills | Status: DC | PRN

## 2016-07-19 NOTE — Progress Notes (Signed)
OFFICE VISIT NOTE Keith FellsMichael D. Delorise Shinerigby, DO  [facility] [phone]  Keith Walker - 31 y.o. male MRN [id number]  Date of birth: 07/19/1985  Visit Date: 07/19/2016  PCP: Sanda Lingerhomas Jones, MD   Referred by: Etta GrandchildJones, Keith L, MD  SUBJECTIVE:   Chief Complaint  Patient presents with  . Follow-up RT shoulder blade pain    Minimal improvement since the start of prednisone and gabapentin. Reports nightmares and insomnia since starting Gabapentin but continues to take it. No new symptoms since last visit.    HPI: Patient reports starting prednisone this morning.  Gabapentin did cause significant nightmares.  He is still having significant soreness and pain.  He has developed worsening headache since over the weekend.  The rash the hands of her bilateral arms chest has begun to worsen as well.  This is why he began steroid this morning.  He denies any fevers does report minimal subjective chills over the weekend.  None currently this time.  He denies any nausea or vomiting.   Please see Problem Oriented charting for further subjective information  ROS:  Otherwise per HPI.  HISTORY & PERTINENT PRIOR DATA:  No specialty comments available. He reports that he has quit smoking. His smoking use included Cigarettes. He has a 2.50 pack-year smoking history. He quit smokeless tobacco use about 2 years ago. His smokeless tobacco use included Snuff. No results for input(s): HGBA1C, LABURIC in the last 8760 hours. Medications & Allergies reviewed per EMR Patient Active Problem List   Diagnosis Date Noted  . Headache 07/21/2016  . Rash and nonspecific skin eruption 06/11/2016  . Insomnia 03/16/2016  . 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 Syphilis 01/31/2016  . High risk sexual behavior 01/28/2016  . Exposure to sexually transmitted disease (STD) 01/28/2016  . 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 Allergic drug rash due to  anti-infective agent 09/10/2014  . Eczema, dyshidrotic 09/10/2014  . Hypogonadism in male 06/16/2014  . ADHD (attention deficit hyperactivity disorder), inattentive type 06/12/2014  . Routine screening for STI (sexually transmitted infection) 12/26/2013  . Asthma due to seasonal allergies 08/07/2013  . Prophylactic measure 06/20/2013  . OSA (obstructive sleep apnea) 04/03/2012  . Annual physical exam 02/22/2012  . Urethritis 02/20/2011  . Depression with anxiety 09/26/2010  . Ulcerative colitis (HCC) 08/22/2010   Past Medical History:  Diagnosis Date  . Arthritis   . Behcet's disease (HCC)   . Blood in stool   . Chicken pox   . Fibromyalgia   . GERD (gastroesophageal reflux disease)   . Hemorrhoids   . Inguinal hernia    bilateral  . Insomnia    d/t pain  . Sleep apnea    does not use cpap  . Ulcer (HCC)   . Ulcerative colitis    Family History  Problem Relation Age of Onset  . Breast cancer Mother   . Hyperlipidemia Maternal Grandmother   . Heart disease Maternal Grandmother   . Diverticulitis Maternal Grandmother   . Lung cancer Maternal Grandfather   . Heart disease Paternal Grandfather   . Heart disease Paternal Grandmother   . Diabetes Maternal Aunt   . Heart disease Paternal Uncle   . Healthy Brother     x3  . Healthy Sister     x2  . Other Father     hypogonadism  . Colon cancer Neg Hx    Past Surgical History:  Procedure Laterality Date  .  COLONOSCOPY    . LASIK Bilateral   . PLACEMENT OF SETON N/A 10/10/2014   Procedure: PLACEMENT OF SETON;  Surgeon: Romie Levee, MD;  Location: [facility];  Service: General;  Laterality: N/A;  . RECTAL EXAM UNDER ANESTHESIA N/A 10/10/2014   Procedure: RECTAL EXAM UNDER ANESTHESIA;  Surgeon: Romie Levee, MD;  Location: [facility];  Service: General;  Laterality: N/A;  . WISDOM TOOTH EXTRACTION     Social History   Occupational History  . business Marine scientist   Social History Main Topics  . Smoking status:  Former Smoker    Packs/day: 0.25    Years: 10.00    Types: Cigarettes  . Smokeless tobacco: Former Neurosurgeon    Types: Snuff    Quit date: 06/24/2014  . Alcohol use 0.6 oz/week    1 Glasses of wine per week     Comment: occasionally   . Drug use: No  . Sexual activity: Yes    Birth control/ protection: Condom     Comment: male    OBJECTIVE:  VS:  HT:5' 4.5" (163.8 cm)   WT:186 lb (84.4 kg)  BMI:31.5    BP:120/90  HR:(!) 107bpm  TEMP:98.4 F (36.9 C)( )  RESP:97 % PHYSICAL EXAM: General:   WDWN, NAD, Non-toxic appearing Psych:  Alert & appropriately interactive  Not depressed or anxious appearing Upper Extremities:  Small vesicular lesion present on bilateral arms and thorax  No significant generalized UE edema.  No clubbing or cyanosis.  Radial pulses 2+/4.  Sensation intact to light touch.   Neck:   Well aligned, no significant torticollis  No significant midline tenderness.    TTP over R periscapular region ROM: Flexion: 50 Extension: 40   Right Left  Rotation: 60 - guarded 60 - guarded  Sidebending: 30 30   NEURAL TENSION SIGNS Right Left  Brachial Plexus Squeeze: Mild Tendernes Mild tenderness  Arm Squeeze Test:  Non-tender  Non-tender  Spurling's  Compression Test: Mild pain / no radiation Mild pain / no rad  Lhermitte's  Compression test:  Mild pain / no rad Mild pain / no rad   REFLEXES Right Left  DTR - C5 -Biceps   2+/4  2+/4  DTR - C6 - Brachiorad  2+/4  2+/4  DTR - C7 - Triceps  2+/4  2+/4   UMN - Hoffman's  Negative/Normal  Negative/Normal  UMN - Pectoral  Negative/Normal  Negative/Normal   Some pain with EOM testing end range. No nystagmus  IMAGING & PROCEDURES: Dg Cervical Spine 2 Or 3 Views  Result Date: 07/15/2016 CLINICAL DATA:  Right neck and shoulder pain without known injury. EXAM: CERVICAL SPINE - 2-3 VIEW COMPARISON:  None. FINDINGS: There is no evidence of cervical spine fracture or prevertebral soft tissue swelling.  Alignment is normal. No other significant bone abnormalities are identified. IMPRESSION: Negative cervical spine radiographs. Electronically Signed   By: Lupita Raider, M.D.   On: 07/15/2016 15:12   Dg Shoulder Right  Result Date: 07/15/2016 CLINICAL DATA:  Right shoulder intermittent pain. EXAM: RIGHT SHOULDER - 2+ VIEW COMPARISON:  No recent prior. FINDINGS: No acute bony or joint abnormality identified. No evidence of fracture or dislocation. IMPRESSION: No acute or focal abnormality . Electronically Signed   By: Maisie Fus  Register   On: 07/15/2016 15:21    ASSESSMENT & PLAN:  Visit Diagnoses:  1. Inflammatory spondylopathy, unspecified spinal region (HCC)   2. History of syphilis   3. Neck pain   4. Exposure  to sexually transmitted disease (STD)   5. Ulcerative colitis without complications, unspecified location (HCC)   6. Depression with anxiety   7. Rash and nonspecific skin eruption   8. Acute intractable headache, unspecified headache type    Meds:  Meds ordered this encounter  Medications  . diazepam (VALIUM) 5 MG tablet    Sig: Take 1 tablet (5 mg total) by mouth every 12 (twelve) hours as needed for anxiety.    Dispense:  30 tablet    Refill:  1    Orders:  Orders Placed This Encounter  Procedures  . RPR  . CBC with Differential/Platelet  . Comprehensive metabolic panel  . Sedimentation rate  . C-reactive protein  . HIV antibody  . Rocky mtn spotted fvr abs pnl(IgG+IgM)  . Lyme Mauricia Area. Blt. IgG & IgM w/bands  . Ferritin    Follow-up: No Follow-up on file. Will be in touch with results.  Needs f/u with Dr. Yetta Barre and Dr. Dierdre Forth  Otherwise please see problem oriented charting as below.

## 2016-07-19 NOTE — Patient Instructions (Signed)
Please schedule a follow up with Dr. Yetta BarreJones at some time later this week.   Try taking the Valium I called in at night to see if this helps with the muscle spasms.    Keep taking your steroid  If you are feeling worse in any way please let Dr. Yetta BarreJones or myself know.

## 2016-07-20 ENCOUNTER — Encounter: Payer: Self-pay | Admitting: Sports Medicine

## 2016-07-20 ENCOUNTER — Telehealth: Payer: Self-pay

## 2016-07-20 LAB — CBC WITH DIFFERENTIAL/PLATELET
Basophils Absolute: 0 10*3/uL (ref 0.0–0.1)
Basophils Relative: 0.6 % (ref 0.0–3.0)
EOS PCT: 0.3 % (ref 0.0–5.0)
Eosinophils Absolute: 0 10*3/uL (ref 0.0–0.7)
HEMATOCRIT: 47.2 % (ref 39.0–52.0)
HEMOGLOBIN: 16.2 g/dL (ref 13.0–17.0)
LYMPHS ABS: 1.6 10*3/uL (ref 0.7–4.0)
Lymphocytes Relative: 25.6 % (ref 12.0–46.0)
MCHC: 34.4 g/dL (ref 30.0–36.0)
MCV: 95.8 fl (ref 78.0–100.0)
MONO ABS: 0.7 10*3/uL (ref 0.1–1.0)
Monocytes Relative: 10.7 % (ref 3.0–12.0)
Neutro Abs: 3.9 10*3/uL (ref 1.4–7.7)
Neutrophils Relative %: 62.8 % (ref 43.0–77.0)
PLATELETS: 265 10*3/uL (ref 150.0–400.0)
RBC: 4.93 Mil/uL (ref 4.22–5.81)
RDW: 13.4 % (ref 11.5–15.5)
WBC: 6.1 10*3/uL (ref 4.0–10.5)

## 2016-07-20 LAB — LYME ABY, WSTRN BLT IGG & IGM W/BANDS
B burgdorferi IgG Abs (IB): NEGATIVE
B burgdorferi IgM Abs (IB): NEGATIVE
LYME DISEASE 23 KD IGG: NONREACTIVE
LYME DISEASE 28 KD IGG: NONREACTIVE
LYME DISEASE 41 KD IGG: NONREACTIVE
LYME DISEASE 45 KD IGG: NONREACTIVE
LYME DISEASE 66 KD IGG: NONREACTIVE
LYME DISEASE 93 KD IGG: NONREACTIVE
Lyme Disease 18 kD IgG: NONREACTIVE
Lyme Disease 23 kD IgM: NONREACTIVE
Lyme Disease 30 kD IgG: NONREACTIVE
Lyme Disease 39 kD IgG: NONREACTIVE
Lyme Disease 39 kD IgM: NONREACTIVE
Lyme Disease 41 kD IgM: NONREACTIVE
Lyme Disease 58 kD IgG: NONREACTIVE

## 2016-07-20 LAB — COMPREHENSIVE METABOLIC PANEL
ALT: 24 U/L (ref 0–53)
AST: 20 U/L (ref 0–37)
Albumin: 4.8 g/dL (ref 3.5–5.2)
Alkaline Phosphatase: 70 U/L (ref 39–117)
BUN: 10 mg/dL (ref 6–23)
CALCIUM: 9.9 mg/dL (ref 8.4–10.5)
CHLORIDE: 101 meq/L (ref 96–112)
CO2: 30 meq/L (ref 19–32)
Creatinine, Ser: 0.9 mg/dL (ref 0.40–1.50)
GFR: 106.05 mL/min (ref >60.00)
Glucose, Bld: 96 mg/dL (ref 70–99)
Potassium: 3.8 mEq/L (ref 3.5–5.1)
Sodium: 141 mEq/L (ref 135–145)
Total Bilirubin: 0.7 mg/dL (ref 0.2–1.2)
Total Protein: 7.8 g/dL (ref 6.0–8.3)

## 2016-07-20 LAB — HIV ANTIBODY (ROUTINE TESTING W REFLEX): HIV 1&2 Ab, 4th Generation: NONREACTIVE

## 2016-07-20 LAB — RPR

## 2016-07-20 LAB — SEDIMENTATION RATE: Sed Rate: 2 mm/hr (ref 0–15)

## 2016-07-20 LAB — FERRITIN: Ferritin: 126.9 ng/mL (ref 22.0–322.0)

## 2016-07-20 LAB — C-REACTIVE PROTEIN: CRP: 1.2 mg/dL (ref 0.5–20.0)

## 2016-07-20 NOTE — Telephone Encounter (Signed)
Pt wanted to know if you would like to see patient again. [place].

## 2016-07-21 ENCOUNTER — Encounter: Payer: Self-pay | Admitting: Sports Medicine

## 2016-07-21 DIAGNOSIS — R519 Headache, unspecified: Secondary | ICD-10-CM | POA: Insufficient documentation

## 2016-07-21 DIAGNOSIS — R51 Headache: Secondary | ICD-10-CM

## 2016-07-21 NOTE — Assessment & Plan Note (Signed)
Subjective Report:  Worsening symptoms over the weekend  Worse with end range EOM  No blurry vision/diplopia or visual discharge  Had prior conjunctivitis when initially started symptoms 6 months ago     Assessment & Plan & Follow up Issues:  New condition  - ? If tension related or possible meningeal related.  Low likely hood and otherwise pt is non-toxic appearing.  Chronic issue but will draw labs to check for potential tick born disease 1. Labs 2. Valium for muscle relaxant called in.

## 2016-07-21 NOTE — Assessment & Plan Note (Signed)
Unclear dx On Humeria as above.  Will check labs Has restarted steroids

## 2016-07-21 NOTE — Assessment & Plan Note (Signed)
I personally called and spoke with Dr. Yetta BarreJones regarding the patient today.  Greater than 50% of this 50 minute visit was spent in direct patient counseling and coordination of care.  Ultimately his symptoms are consistent with his prior presentations although his right shoulder pain does seem to be mildly worse and headache is new ID has previously felt that this is definitively not neurosyphilis.  Given this we will go ahead and check other labs including for tickborne illness.  This may relate to his underlying inflammatory arthropathy and following up with Dr. Tomasa BlaseBacon will be recommended either way.   We will go ahead and recheck a RPR as well as a HIV this could potentially be in the conversion process for HIV.  This was performed at the patient's request.  Additionally patient was concerned due to family history of hemochromatosis and ferritin will be drawn as well.

## 2016-07-21 NOTE — Assessment & Plan Note (Signed)
On Humeria per Dr. Dierdre ForthBeekman.  ? If rash could be related to either underlying disease or med related  Pt needs f/u with Dr. Dierdre ForthBeekman

## 2016-07-21 NOTE — Telephone Encounter (Signed)
Informed pt of the same.

## 2016-07-21 NOTE — Telephone Encounter (Signed)
If he still has sx's then yes

## 2016-07-21 NOTE — Assessment & Plan Note (Signed)
He is on Xanax intermittently.  We will change him to Valium for short-term benefit of muscle relaxant properties.  I did call and discuss this with the pharmacy following day.

## 2016-07-21 NOTE — Assessment & Plan Note (Signed)
Subjective Report:  Worsening over the weekend  Started steroid this AM due to worsening rash on Arms and trunk  Same rash that has been present     Assessment & Plan & Follow up Issues:  Chronic worsening condition  - unclear etiology - possibly viral vs auto-immune vs STI related 1. Continue systemic steroid as started this AM

## 2016-07-24 LAB — ROCKY MTN SPOTTED FVR ABS PNL(IGG+IGM)
RMSF IGM: NOT DETECTED
RMSF IgG: NOT DETECTED

## 2016-07-25 ENCOUNTER — Encounter: Payer: Self-pay | Admitting: Internal Medicine

## 2016-07-25 ENCOUNTER — Other Ambulatory Visit: Payer: Self-pay | Admitting: Internal Medicine

## 2016-07-25 ENCOUNTER — Telehealth: Payer: Self-pay | Admitting: Sports Medicine

## 2016-07-25 DIAGNOSIS — G8929 Other chronic pain: Secondary | ICD-10-CM | POA: Insufficient documentation

## 2016-07-25 DIAGNOSIS — F900 Attention-deficit hyperactivity disorder, predominantly inattentive type: Secondary | ICD-10-CM

## 2016-07-25 DIAGNOSIS — M25511 Pain in right shoulder: Principal | ICD-10-CM

## 2016-07-25 MED ORDER — AMPHETAMINE-DEXTROAMPHETAMINE 20 MG PO TABS: 20.0000 mg | ORAL_TABLET | Freq: Every day | ORAL | 0 refills | Status: DC

## 2016-07-25 NOTE — Telephone Encounter (Signed)
Spoke with patient, he has seen most results on MyChart. He was concerned about the HIV test because the results weren't available to him. I advised pt that result came back as "non reactive" but Dr. Berline Chough would need to give the final result.

## 2016-07-25 NOTE — Telephone Encounter (Signed)
My chart message has been sent.  All testing revealed normal results.

## 2016-07-25 NOTE — Assessment & Plan Note (Signed)
He does have a very small left inguinal hernia appreciated on exam today.  Red flag symptoms reviewed with the patient.  If continues to be bothersome recommend reevaluation with general surgery.

## 2016-07-25 NOTE — Assessment & Plan Note (Signed)
Symptoms do seem consistent with musculoskeletal cause today given the marked limitations in his shoulder mobility.  Osteopathic manipulation was performed today and he responded quite well to this.  I would like to follow-up with him early next week for repeat evaluation.  If any lack of improvement would consider further evaluation with MRI of the cervical spine versus investigating underlying autoimmune/rheumatologic/infectious etiology

## 2016-07-25 NOTE — Telephone Encounter (Signed)
Patient called to inquire about his lab/imaging results. Patient expressed that he was anxiously awaiting the results. Please call and advise patient of results once they are ready.

## 2016-07-25 NOTE — Telephone Encounter (Signed)
Place script up front for pick-up tomorrow...Keith Walker

## 2016-07-26 ENCOUNTER — Encounter: Payer: Self-pay | Admitting: Sports Medicine

## 2016-07-26 DIAGNOSIS — K4090 Unilateral inguinal hernia, without obstruction or gangrene, not specified as recurrent: Secondary | ICD-10-CM

## 2016-07-26 NOTE — Telephone Encounter (Signed)
I am going to place a referral to General Surgery (Dr. Romie Levee, who I believe you have seen before) for them to further evaluate this.  If you are having and progressive worsening pain, nausea, vomiting or significantly enlarging mass let us know as this is something you would need to be seen for urgently.

## 2016-07-28 ENCOUNTER — Encounter: Payer: Self-pay | Admitting: Internal Medicine

## 2016-07-28 ENCOUNTER — Encounter: Payer: Self-pay | Admitting: Sports Medicine

## 2016-07-28 NOTE — Telephone Encounter (Signed)
Called pt at [phone] and spoke with patient. He says that he is in [facility] right now. He went to the ED last night because he had 101.7 fever and his face was tingling and the pain had gotten worse. He has a C that showed that his lymph nodes were swollen, it was not a hernia. They weren't 100% sure what was causing the swelling. Pt says that he was prescribed Doxycycline but he has concerns about taking it because he didn't feel conformable with the provider that he saw. He had labs done at the hospital and they were all normal. The provider wanted to treat him for STD but he declined injection. He says that his fever broke this morning. He says that his pain is a lot less severe then it was last night. He was given morphine via IV last night. Pt is aware that today is Dr. Janeece Riggers half day. I will send to Dr. Berline Chough and Dr. Yetta Barre.

## 2016-07-28 NOTE — Telephone Encounter (Signed)
I called and spoke with the patient.  Reports the above symptoms.  Recommended to start on doxycycline which I agreed and encouraged him to do.  He has been hesitant to do this in the past due to stomach upset.  Reports the CT scan did show lymphadenopathy as well as distal colon swelling.  I recommend he obtain the CT scan from the hospital in [place] prior to returning to [place].  Also recommended that he follow-up with Dr. Yetta Barre as soon as possible for reevaluation of all the above, ongoing symptoms.  He does report the headache that he has had slightly improved until receiving morphine in the emergency department yesterday.  He denies any recurrent fevers at this time.  He is immunosuppressed reports his white blood cell count was normal.  He likely needs follow-up with Dr. Yetta Barre initially as well as with Dr. Dierdre Forth and Dr. Rhea Belton.

## 2016-07-29 ENCOUNTER — Encounter: Payer: Self-pay | Admitting: Nurse Practitioner

## 2016-07-29 ENCOUNTER — Ambulatory Visit (INDEPENDENT_AMBULATORY_CARE_PROVIDER_SITE_OTHER): Payer: 59 | Admitting: Nurse Practitioner

## 2016-07-29 ENCOUNTER — Other Ambulatory Visit: Payer: 59

## 2016-07-29 ENCOUNTER — Encounter: Payer: Self-pay | Admitting: Internal Medicine

## 2016-07-29 ENCOUNTER — Ambulatory Visit: Payer: 59 | Admitting: Family

## 2016-07-29 ENCOUNTER — Ambulatory Visit: Payer: 59 | Admitting: Nurse Practitioner

## 2016-07-29 ENCOUNTER — Other Ambulatory Visit (HOSPITAL_COMMUNITY)
Admission: RE | Admit: 2016-07-29 | Discharge: 2016-07-29 | Disposition: A | Discharge status: Home / Self Care | Payer: 59 | Source: Ambulatory Visit | Attending: Nurse Practitioner | Admitting: Nurse Practitioner

## 2016-07-29 VITALS — BP 126/80 | HR 99 | Temp 98.8°F | Ht 64.5 in | Wt 188.0 lb

## 2016-07-29 DIAGNOSIS — I889 Nonspecific lymphadenitis, unspecified: Secondary | ICD-10-CM

## 2016-07-29 DIAGNOSIS — K6289 Other specified diseases of anus and rectum: Secondary | ICD-10-CM

## 2016-07-29 DIAGNOSIS — Z113 Encounter for screening for infections with a predominantly sexual mode of transmission: Secondary | ICD-10-CM | POA: Diagnosis not present

## 2016-07-29 MED ORDER — CEFTRIAXONE SODIUM 1 G IJ SOLR
1.0000 g | Freq: Once | INTRAMUSCULAR | Status: AC
  Administered 2016-07-29: 1 g via INTRAMUSCULAR

## 2016-07-29 MED ORDER — CEFTRIAXONE SODIUM 1 G IJ SOLR: 1.0000 g | Freq: Once | INTRAMUSCULAR | Status: DC

## 2016-07-29 NOTE — Progress Notes (Signed)
Pre visit review using our clinic review tool, if applicable. No additional management support is needed unless otherwise documented below in the visit note. 

## 2016-07-29 NOTE — Progress Notes (Signed)
Subjective:  Patient ID: Keith Walker, male    DOB: May 02, 1985  Age: 31 y.o. MRN: [id number]  CC: Follow-up (swelling/painful groin area (lymp node swelling)--fever on and off. went to ER)  Groin Pain  The patient's primary symptoms include pelvic pain. The patient's pertinent negatives include no genital injury, genital itching, genital lesions, penile discharge, penile pain, priapism, scrotal swelling or testicular pain. This is a new problem. The current episode started in the past 7 days. The problem occurs constantly. The problem has been gradually worsening. The pain is severe. Associated symptoms include chills and a fever. Pertinent negatives include no abdominal pain, anorexia, chest pain, constipation, coughing, diarrhea, discolored urine, dysuria, flank pain, frequency, headaches, hematuria, hesitancy, joint pain, joint swelling, nausea, painful intercourse, rash, shortness of breath, sore throat, urgency, urinary retention or vomiting. Associated symptoms comments: Fever yesterday of 100. He is sexually active. It is unknown whether or not his partner has an STD. His past medical history is significant for herpes simplex and syphilis. There is no history of BPH, chlamydia, erectile aid use, erectile dysfunction, a femoral hernia, gonorrhea, HIV, an inguinal hernia, kidney stones, prostatitis, sickle cell disease or varicocele.  he was evaluated by provider in [facility] ED yesterday: reviewed lab and radiology reports.(provided by patient).  CT ABD/Pelvis: suppurative inguinal lymphadenitis and proctitis.  Normal CBC and CMP and CXR.  Outpatient Medications Prior to Visit  Medication Sig Dispense Refill  . Adalimumab (HUMIRA PEN) 40 MG/0.8ML PNKT Inject 40 mg into the muscle every 14 (fourteen) days. Patient adminsters IM himself    . ALPRAZolam (XANAX) 1 MG tablet Take 1 tablet (1 mg total) by mouth 2 (two) times daily as needed for anxiety or sleep. 60 tablet 3  .  amphetamine-dextroamphetamine (ADDERALL) 20 MG tablet Take 1 tablet (20 mg total) by mouth daily. 30 tablet 0  . diazepam (VALIUM) 5 MG tablet Take 1 tablet (5 mg total) by mouth every 12 (twelve) hours as needed for anxiety. 30 tablet 1  . emtricitabine-tenofovir (TRUVADA) 200-300 MG tablet Take 1 tablet by mouth daily.    . famciclovir (FAMVIR) 250 MG tablet Take 1 tablet (250 mg total) by mouth 2 (two) times daily. 60 tablet 5  . gabapentin (NEURONTIN) 300 MG capsule Start with 1 tab po qhs X 1 week, then increase to 1 tab po bid X 1 week then 1 tab po tid prn 90 capsule 1  . sertraline (ZOLOFT) 50 MG tablet Take 1 tablet (50 mg total) by mouth at bedtime. 90 tablet 1  . triamcinolone cream (KENALOG) 0.5 % Apply 1 application topically 3 (three) times daily. 454 g 1  . methylPREDNISolone (MEDROL DOSEPAK) 4 MG TBPK tablet Take by mouth as directed. Take 6 tablets on the first day prescribed then as directed. 21 tablet 0   No facility-administered medications prior to visit.     ROS See HPI  Objective:  BP 126/80   Pulse 99   Temp 98.8 F (37.1 C)   Ht 5' 4.5" (1.638 m)   Wt 188 lb (85.3 kg)   SpO2 100%   BMI 31.77 kg/m   BP Readings from Last 3 Encounters:  07/29/16 126/80  07/19/16 120/90  07/15/16 130/90    Wt Readings from Last 3 Encounters:  07/29/16 188 lb (85.3 kg)  07/19/16 186 lb (84.4 kg)  07/15/16 186 lb 6.4 oz (84.6 kg)    Physical Exam  Constitutional: He is oriented to person, place, and time. No distress.  Cardiovascular:  Regular rhythm.   Abdominal: Soft. Bowel sounds are normal. Hernia confirmed negative in the right inguinal area and confirmed negative in the left inguinal area.  Genitourinary: Testes normal and penis normal. Rectal exam shows no external hemorrhoid, no internal hemorrhoid, no fissure, no tenderness and anal tone normal. Circumcised. No penile erythema. No discharge found.  Genitourinary Comments: Inguinal lymphadenopathy with induration,  No fluctuance, no erythema.  Musculoskeletal: Normal range of motion. He exhibits tenderness. He exhibits no edema.  Lymphadenopathy:       Right: Inguinal adenopathy present.       Left: Inguinal adenopathy present.  Neurological: He is alert and oriented to person, place, and time.  Skin: Skin is warm and dry. No erythema.  Vitals reviewed.   Lab Results  Component Value Date   WBC 6.1 07/19/2016   HGB 16.2 07/19/2016   HCT 47.2 07/19/2016   PLT 265.0 07/19/2016   GLUCOSE 96 07/19/2016   CHOL 167 02/20/2012   TRIG 163 (H) 02/20/2012   HDL 43 02/20/2012   LDLCALC 91 02/20/2012   ALT 24 07/19/2016   AST 20 07/19/2016   NA 141 07/19/2016   K 3.8 07/19/2016   CL 101 07/19/2016   CREATININE 0.90 07/19/2016   BUN 10 07/19/2016   CO2 30 07/19/2016   TSH 1.50 06/12/2014    [name]  Result Date: 06/02/2015 CLINICAL DATA:  Crohn's disease, usually with left-sided pain, now with right and transverse colonic pain, evaluate for progression of disease. EXAM: MR ABDOMEN AND PELVIS WITHOUT AND WITH CONTRAST (MR ENTEROGRAPHY) TECHNIQUE: Multiplanar, multisequence MRI of the abdomen and pelvis was performed both before and during bolus administration of intravenous contrast. Negative oral contrast VoLumen was given. CONTRAST:  20mL MULTIHANCE GADOBENATE DIMEGLUMINE 529 MG/ML IV SOLN COMPARISON:  CT abdomen pelvis dated 03/29/2011. FINDINGS: Lower chest:  Lung bases are clear. Hepatobiliary: Liver is within normal limits. No suspicious/enhancing hepatic lesions. Gallbladder is within normal limits. No intrahepatic or extrahepatic ductal dilatation. Pancreas: Within normal limits. Spleen: Within normal limits. Adrenals/Urinary Tract: Adrenal glands are within normal limits. Kidneys are within normal limits.  No hydronephrosis. Bladder is within normal limits. Stomach/Bowel: Stomach is within normal limits. No evidence of bowel obstruction. No small bowel wall thickening or inflammatory  changes. No evidence of stricture. No colonic wall thickening or inflammatory changes. No evidence of fistula or abscess. Vascular/Lymphatic: No evidence of abdominal aortic aneurysm. No suspicious abdominopelvic lymphadenopathy. Reproductive: Prostate is unremarkable. Other: No abdominopelvic ascites. Musculoskeletal: No focal osseous lesions. IMPRESSION: No evidence of acute inflammatory Crohn's disease. No evidence of bowel obstruction or stricture. Normal MRI abdomen/pelvis. Electronically Signed   By: Charline Bills M.D.   On: 06/02/2015 10:28   Mr Leslye Peer Pelvis W/o W/cm  Result Date: 06/02/2015 CLINICAL DATA:  Crohn's disease, usually with left-sided pain, now with right and transverse colonic pain, evaluate for progression of disease. EXAM: MR ABDOMEN AND PELVIS WITHOUT AND WITH CONTRAST (MR ENTEROGRAPHY) TECHNIQUE: Multiplanar, multisequence MRI of the abdomen and pelvis was performed both before and during bolus administration of intravenous contrast. Negative oral contrast VoLumen was given. CONTRAST:  20mL MULTIHANCE GADOBENATE DIMEGLUMINE 529 MG/ML IV SOLN COMPARISON:  CT abdomen pelvis dated 03/29/2011. FINDINGS: Lower chest:  Lung bases are clear. Hepatobiliary: Liver is within normal limits. No suspicious/enhancing hepatic lesions. Gallbladder is within normal limits. No intrahepatic or extrahepatic ductal dilatation. Pancreas: Within normal limits. Spleen: Within normal limits. Adrenals/Urinary Tract: Adrenal glands are within normal limits. Kidneys are within normal  limits.  No hydronephrosis. Bladder is within normal limits. Stomach/Bowel: Stomach is within normal limits. No evidence of bowel obstruction. No small bowel wall thickening or inflammatory changes. No evidence of stricture. No colonic wall thickening or inflammatory changes. No evidence of fistula or abscess. Vascular/Lymphatic: No evidence of abdominal aortic aneurysm. No suspicious abdominopelvic lymphadenopathy. Reproductive:  Prostate is unremarkable. Other: No abdominopelvic ascites. Musculoskeletal: No focal osseous lesions. IMPRESSION: No evidence of acute inflammatory Crohn's disease. No evidence of bowel obstruction or stricture. Normal MRI abdomen/pelvis. Electronically Signed   By: Charline Bills M.D.   On: 06/02/2015 10:28    Assessment & Plan:   Takumi was seen today for follow-up.  Diagnoses and all orders for this visit:  Inguinal lymphadenitis -     Cytology (oral, anal, urethral) ancillary only; Future -     cefTRIAXone (ROCEPHIN) injection 1 g; Inject 1 g into the muscle once.  Proctitis -     Cytology (oral, anal, urethral) ancillary only; Future -     cefTRIAXone (ROCEPHIN) injection 1 g; Inject 1 g into the muscle once.   I have discontinued Mr. Amadon methylPREDNISolone. I am also having him maintain his Adalimumab, ALPRAZolam, sertraline, famciclovir, triamcinolone cream, emtricitabine-tenofovir, gabapentin, diazepam, amphetamine-dextroamphetamine, doxycycline, and valACYclovir. We will continue to administer cefTRIAXone.  Meds ordered this encounter  Medications  . doxycycline (VIBRAMYCIN) 100 MG capsule    Sig: Take 100 mg by mouth 2 (two) times daily.  . valACYclovir (VALTREX) 500 MG tablet    Sig: Take 500 mg by mouth as needed.  . cefTRIAXone (ROCEPHIN) injection 1 g    Follow-up: Return in about 1 week (around 08/05/2016) for inguinal lymphadenitis with Dr. Yetta Barre.  Alysia Penna, NP

## 2016-07-29 NOTE — Addendum Note (Signed)
Addended by: Radford Pax M on: 07/29/2016 04:53 PM   Modules accepted: Orders

## 2016-07-29 NOTE — Telephone Encounter (Signed)
Called pt and he said was seen the in emergency room. Pt had a fever with swollen inguinal lymph nodes. Pt is still not feeling well. Advised that he needed to be seen to ensure that he is on the correct abx and that the swelling is decreasing and the fever is getting better.   Made an appt with Keith Walker today and forwarding this message to PCP. Pt is going to try and get a copy of the CT scan before his appt today. Not able to abstract from care everywhere.

## 2016-07-29 NOTE — Patient Instructions (Signed)
Hold humira for 1week. Complete doxycycline as prescribed.

## 2016-08-02 LAB — CYTOLOGY, (ORAL, ANAL, URETHRAL) ANCILLARY ONLY
CHLAMYDIA, DNA PROBE: POSITIVE — AB
NEISSERIA GONORRHEA: NEGATIVE

## 2016-08-03 ENCOUNTER — Ambulatory Visit: Payer: 59 | Admitting: Internal Medicine

## 2016-08-04 ENCOUNTER — Ambulatory Visit (INDEPENDENT_AMBULATORY_CARE_PROVIDER_SITE_OTHER): Payer: 59 | Admitting: Internal Medicine

## 2016-08-04 VITALS — BP 118/74 | HR 73 | Temp 98.7°F | Resp 16 | Ht 64.5 in | Wt 183.0 lb

## 2016-08-04 DIAGNOSIS — R21 Rash and other nonspecific skin eruption: Secondary | ICD-10-CM | POA: Diagnosis not present

## 2016-08-04 DIAGNOSIS — F900 Attention-deficit hyperactivity disorder, predominantly inattentive type: Secondary | ICD-10-CM | POA: Diagnosis not present

## 2016-08-04 DIAGNOSIS — A563 Chlamydial infection of anus and rectum: Secondary | ICD-10-CM | POA: Insufficient documentation

## 2016-08-04 MED ORDER — AZITHROMYCIN 1 G PO PACK: 1.0000 g | PACK | Freq: Once | ORAL | 0 refills | Status: AC

## 2016-08-04 MED ORDER — AMPHETAMINE-DEXTROAMPHETAMINE 20 MG PO TABS: 20.0000 mg | ORAL_TABLET | Freq: Every day | ORAL | 0 refills | Status: DC

## 2016-08-04 MED ORDER — AMPHETAMINE-DEXTROAMPHETAMINE 10 MG PO TABS
10.0000 mg | ORAL_TABLET | Freq: Every day | ORAL | 0 refills | Status: DC
Start: 2016-08-04 — End: 2016-09-15

## 2016-08-04 NOTE — Progress Notes (Signed)
Subjective:  Patient ID: Keith Walker, male    DOB: [birth year]  Age: 31 y.o. MRN: [id number]  CC: Lymphadenopathy   HPI [name] presents for Follow-up on inguinal lymphadenopathy. This started about 2 weeks ago when he was seen elsewhere in an ED and had a CT scan that showed suppurative, bilateral inguinal lymphadenopathy and proctitis. He was given Rocephin and was asked to take Doxy but he didn't start taking it. He was ultimately seen here by nurse practitioner about a week ago and was prescribed.doxycycline which he has taken with less than perfect compliance. Initially he had fever and chills but he says that has resolved. His recent rectal swab was positive for chlamydia. His only complaint today is of inguinal and laugh adenopathy worse on the left than the right. He has no rectal pain or tenesmus.  He also complains that the current dose of Adderall for ADHD, 20 mg, only gets him to 1 PM and then he says his focus and productivity significantly declined. He wants to increase the dose.  In addition, he complains of a recurrent rash. It is scattered sparsely throughout his torso and upper extremities. It is mildly pruritic but otherwise does not bother him.  Outpatient Medications Prior to Visit  Medication Sig Dispense Refill  . Adalimumab (HUMIRA PEN) 40 MG/0.8ML PNKT Inject 40 mg into the muscle every 14 (fourteen) days. Patient adminsters IM himself    . emtricitabine-tenofovir (TRUVADA) 200-300 MG tablet Take 1 tablet by mouth daily.    . famciclovir (FAMVIR) 250 MG tablet Take 1 tablet (250 mg total) by mouth 2 (two) times daily. 60 tablet 5  . gabapentin (NEURONTIN) 300 MG capsule Start with 1 tab po qhs X 1 week, then increase to 1 tab po bid X 1 week then 1 tab po tid prn 90 capsule 1  . sertraline (ZOLOFT) 50 MG tablet Take 1 tablet (50 mg total) by mouth at bedtime. 90 tablet 1  . triamcinolone cream (KENALOG) 0.5 % Apply 1 application topically 3 (three)  times daily. 454 g 1  . valACYclovir (VALTREX) 500 MG tablet Take 500 mg by mouth as needed.    . ALPRAZolam (XANAX) 1 MG tablet Take 1 tablet (1 mg total) by mouth 2 (two) times daily as needed for anxiety or sleep. 60 tablet 3  . amphetamine-dextroamphetamine (ADDERALL) 20 MG tablet Take 1 tablet (20 mg total) by mouth daily. 30 tablet 0  . diazepam (VALIUM) 5 MG tablet Take 1 tablet (5 mg total) by mouth every 12 (twelve) hours as needed for anxiety. 30 tablet 1  . doxycycline (VIBRAMYCIN) 100 MG capsule Take 100 mg by mouth 2 (two) times daily.     No facility-administered medications prior to visit.     ROS Review of Systems  Constitutional: Negative.  Negative for appetite change, chills, fatigue and unexpected weight change.  HENT: Negative.   Eyes: Negative.   Respiratory: Negative for cough, chest tightness, shortness of breath and wheezing.   Cardiovascular: Negative.  Negative for chest pain, palpitations and leg swelling.  Gastrointestinal: Negative for abdominal pain, diarrhea, nausea and vomiting.  Endocrine: Negative.   Genitourinary: Negative for difficulty urinating, discharge, dysuria, frequency, genital sores, penile pain, penile swelling, scrotal swelling and testicular pain.  Musculoskeletal: Negative.   Skin: Positive for rash. Negative for color change.  Allergic/Immunologic: Negative.   Neurological: Negative.  Negative for dizziness.  Hematological: Positive for adenopathy. Does not bruise/bleed easily.  Psychiatric/Behavioral: Positive for decreased concentration. Negative for  agitation, behavioral problems, confusion, self-injury and suicidal ideas. The patient is not nervous/anxious.     Objective:  BP 118/74 (BP Location: Left Arm, Patient Position: Sitting, Cuff Size: Normal)   Pulse 73   Temp 98.7 F (37.1 C) (Oral)   Resp 16   Ht 5' 4.5" (1.638 m)   Wt 183 lb 0.6 oz (83 kg)   SpO2 99%   BMI 30.93 kg/m   BP Readings from Last 3 Encounters:    08/04/16 118/74  07/29/16 126/80  07/19/16 120/90    Wt Readings from Last 3 Encounters:  08/04/16 183 lb 0.6 oz (83 kg)  07/29/16 188 lb (85.3 kg)  07/19/16 186 lb (84.4 kg)    Physical Exam  Constitutional: He is oriented to person, place, and time. No distress.  HENT:  Mouth/Throat: Oropharynx is clear and moist. No oropharyngeal exudate.  Eyes: Conjunctivae are normal. Right eye exhibits no discharge. Left eye exhibits no discharge. No scleral icterus.  Neck: Normal range of motion. Neck supple. No JVD present. No tracheal deviation present. No thyromegaly present.  Cardiovascular: Normal rate, regular rhythm, normal heart sounds and intact distal pulses.  Exam reveals no gallop and no friction rub.   No murmur heard. Pulmonary/Chest: Effort normal and breath sounds normal. No respiratory distress. He has no wheezes. He has no rales. He exhibits no tenderness.  Abdominal: Bowel sounds are normal. He exhibits no distension and no mass. There is no tenderness. There is no rebound and no guarding.  Musculoskeletal: Normal range of motion. He exhibits no edema, tenderness or deformity.  Lymphadenopathy:    He has no cervical adenopathy.    He has no axillary adenopathy.       Right: Inguinal adenopathy present. No supraclavicular and no epitrochlear adenopathy present.       Left: Inguinal adenopathy present. No epitrochlear adenopathy present.  There is mild bilateral inguinal lymphadenopathy worse on the left than the right, the lymph nodes measure about 2-3 cm, they are freely mobile, mildly tender, nontender and nonfluctuant. The overlying skin is normal.  Neurological: He is oriented to person, place, and time.  Skin: Skin is warm and dry. Rash noted. No lesion, no petechiae and no purpura noted. Rash is papular. Rash is not nodular and not vesicular. He is not diaphoretic. No cyanosis or erythema. No pallor. Nails show no clubbing.  He has a scattered, bilateral and symmetrical  papular rash with faint scaling.  Vitals reviewed.   Lab Results  Component Value Date   WBC 6.1 07/19/2016   HGB 16.2 07/19/2016   HCT 47.2 07/19/2016   PLT 265.0 07/19/2016   GLUCOSE 96 07/19/2016   CHOL 167 02/20/2012   TRIG 163 (H) 02/20/2012   HDL 43 02/20/2012   LDLCALC 91 02/20/2012   ALT 24 07/19/2016   AST 20 07/19/2016   NA 141 07/19/2016   K 3.8 07/19/2016   CL 101 07/19/2016   CREATININE 0.90 07/19/2016   BUN 10 07/19/2016   CO2 30 07/19/2016   TSH 1.50 06/12/2014    No results found.  Assessment & Plan:   Fitzhugh was seen today for lymphadenopathy.  Diagnoses and all orders for this visit:  ADHD (attention deficit hyperactivity disorder), inattentive type- will increase his dose to see if he can achieve better focus and productivity at work -     amphetamine-dextroamphetamine (ADDERALL) 10 MG tablet; Take 1 tablet (10 mg total) by mouth daily at 12 noon. -     amphetamine-dextroamphetamine (  ADDERALL) 20 MG tablet; Take 1 tablet (20 mg total) by mouth daily.  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  Rash and nonspecific skin eruption- this has been a recurrent rash for nearly a month now. The diagnosis has been elusive. It is not very symptomatic so I elected not to treat it today. I did want to do a biopsy of one of the lesions today to better diagnose this but he told me he did not have enough time to do that so he will return in about 5 or 7 days for me to do a biopsy to better identify and treat the rash.   I have discontinued Mr. Borges's ALPRAZolam, diazepam, and doxycycline. I am also having him start on amphetamine-dextroamphetamine and azithromycin. Additionally, I am having him maintain his Adalimumab, sertraline,  famciclovir, triamcinolone cream, emtricitabine-tenofovir, gabapentin, valACYclovir, and amphetamine-dextroamphetamine.  Meds ordered this encounter  Medications  . amphetamine-dextroamphetamine (ADDERALL) 10 MG tablet    Sig: Take 1 tablet (10 mg total) by mouth daily at 12 noon.    Dispense:  30 tablet    Refill:  0  . amphetamine-dextroamphetamine (ADDERALL) 20 MG tablet    Sig: Take 1 tablet (20 mg total) by mouth daily.    Dispense:  30 tablet    Refill:  0  . azithromycin (ZITHROMAX) 1 g powder    Sig: Take 1 packet (1 g total) by mouth once.    Dispense:  1 each    Refill:  0     Follow-up: Return in about 1 week (around 08/11/2016).  Sanda Linger, MD

## 2016-08-04 NOTE — Progress Notes (Signed)
Pre visit review using our clinic review tool, if applicable. No additional management support is needed unless otherwise documented below in the visit note. 

## 2016-08-04 NOTE — Patient Instructions (Signed)
Preventing Sexually Transmitted Infections, Adult Sexually transmitted infections (STIs) are diseases that are passed (transmitted) from person to person through bodily fluids exchanged during sex or sexual contact. Bodily fluids include saliva, semen, blood, vaginal mucus, and urine. You may have an increased risk for developing an STI if you have unprotected oral, vaginal, or anal sex. Some common STIs include:  Herpes.  Hepatitis B.  Chlamydia.  Gonorrhea.  Syphilis.  HPV (human papillomavirus).  HIV (humanimmunodeficiency virus), the virus that can cause AIDS (acquired immunodeficiency virus). How can I protect myself from sexually transmitted infections? The only way to completely prevent STIs is not to have sex of any kind (practice abstinence). This includes oral, vaginal, or anal sex. If you are sexually active, take these actions to lower your risk of getting an STI:  Have only one sex partner (be monogamous) or limit the number of sexual partners you have.  Stay up-to-date on immunizations. Certain vaccines can lower your risk of getting certain STIs, such as:  Hepatitis A and B vaccines. You may have been vaccinated as a young child, but likely need a booster shot as a teen or young adult.  HPV vaccine. This vaccine is recommended if you are a man under age 22 or a woman under age 27.  Use methods that prevent the exchange of body fluids between partners (barrier protection) every time you have sex. Barrier protection can be used during oral, vaginal, or anal sex. Commonly used barrier methods include:  Male condom.  Male condom.  Dental dam.  Get tested regularly for STIs. Have your sexual partner get tested regularly as well.  Avoid mixing alcohol, drugs, and sex. Alcohol and drug use can affect your ability to make good decisions and can lead to risky sexual behaviors.  Ask your health care provider about taking pre-exposure prophylaxis (PrEP) to prevent HIV  infection if you:  Have a HIV-positive sexual partner.  Have multiple sexual partners or partners who do not know their HIV status, and do not regularly use a condom during sex.  Use injection drugs and share needles. Birth control pills, injections, implants, and intrauterine devices (IUDs) do not protect against STIs. To prevent both STIs and pregnancy, always use a condom with another form of birth control. Some STIs, such as herpes, are spread through skin to skin contact. A condom does not protect you from getting such STIs. If you or your partner have herpes and there is an active flare with open sores, avoid all sexual contact. Why are these changes important? Taking steps to practice safe sex protects you and others. Many STIs can be cured. However, some STIs are not curable and will affect you for the rest of your life. STIs can be passed on to another person even if you do not have symptoms. What can happen if changes are not made? Certain STIs may:  Require you to take medicine for the rest of your life.  Affect your ability to have children (your fertility).  Increase your risk for developing another STI or certain serious health conditions, such as:  Cervical cancer.  Head and neck cancer.  Pelvic inflammatory disease (PID) in women.  Organ damage or damage to other parts of your body, if the infection spreads.  Be passed to a baby during childbirth. How are sexually transmitted infections treated? If you or your partner know or think that you may have an STI:  Talk with your healthcare provider about what can be done to treat   it. Some STIs can be treated and cured with medicines.  For curable STIs, you and your partner should avoid sex during treatment and for several days after treatment is complete.  You and your partner should both be treated at the same time, if there is any chance that your partner is infected as well. If you get treatment but your partner does  not, your partner can re-infect you when you resume sexual contact.  Do not have unprotected sex. Where to find more information: Learn more about sexually transmitted diseases and infections from:  Centers for Disease Control and Prevention:  More information about specific STIs: www.cdc.gov/std  Find places to get sexual health counseling and treatment for free or for a low cost: gettested.cdc.gov  [place] Department of Health and Human Services: www.womenshealth.gov/publications/our-publications/fact-sheet/sexually-transmitted-infections.html Summary  The only way to completely prevent STIs is not to have sex (practice abstinence), including oral, vaginal, or anal sex.  STIs can spread through saliva, semen, blood, vaginal mucus, urine, or sexual contact.  If you do have sex, limit your number of sexual partners and use a barrier protection method every time you have sex.  If you develop an STI, get treated right away and ask your partner to be treated as well. Do not resume having sex until both of you have completed treatment for the STI. This information is not intended to replace advice given to you by your health care provider. Make sure you discuss any questions you have with your health care provider. Document Released: 04/07/2016 Document Revised: 04/07/2016 Document Reviewed: 04/07/2016 Elsevier Interactive Patient Education  2017 [employer]vier Inc.  

## 2016-08-05 ENCOUNTER — Encounter: Payer: Self-pay | Admitting: Internal Medicine

## 2016-08-05 ENCOUNTER — Telehealth: Payer: Self-pay | Admitting: Internal Medicine

## 2016-08-05 NOTE — Telephone Encounter (Signed)
Patient was prescribed azithromycin powder pack by Dr. Yetta Barre.  Requesting this to be changed to tablets.  States that patient is getting ready to leave from town.  [facility] has this on back order.  Patient wants script changed today.  Did tell pharmacy that Dr. Yetta Barre is out of the office. Can another provider ok today?

## 2016-08-05 NOTE — Telephone Encounter (Signed)
Okay to change from powder to capsule.

## 2016-08-05 NOTE — Telephone Encounter (Signed)
Yes, ok to change

## 2016-08-05 NOTE — Telephone Encounter (Signed)
Called [facility] with OK.

## 2016-08-06 ENCOUNTER — Encounter: Payer: Self-pay | Admitting: Internal Medicine

## 2016-08-11 ENCOUNTER — Other Ambulatory Visit: Payer: Self-pay | Admitting: Internal Medicine

## 2016-08-11 ENCOUNTER — Ambulatory Visit (INDEPENDENT_AMBULATORY_CARE_PROVIDER_SITE_OTHER): Payer: 59 | Admitting: Internal Medicine

## 2016-08-11 ENCOUNTER — Encounter: Payer: Self-pay | Admitting: Internal Medicine

## 2016-08-11 VITALS — BP 128/80 | HR 98 | Temp 98.2°F | Resp 16 | Ht 64.5 in | Wt 180.0 lb

## 2016-08-11 DIAGNOSIS — Z23 Encounter for immunization: Secondary | ICD-10-CM | POA: Diagnosis not present

## 2016-08-11 DIAGNOSIS — R21 Rash and other nonspecific skin eruption: Secondary | ICD-10-CM | POA: Diagnosis not present

## 2016-08-11 NOTE — Progress Notes (Signed)
Subjective:  Patient ID: Keith Walker, male    DOB: 1985-12-04  Age: 31 y.o. MRN: [id number]  CC: Rash   HPI Kedron Dekker presents for a skin bx. He has a persistent rash that sometimes itches and is sometimes uncomfortable.   Outpatient Medications Prior to Visit  Medication Sig Dispense Refill  . Adalimumab (HUMIRA PEN) 40 MG/0.8ML PNKT Inject 40 mg into the muscle every 14 (fourteen) days. Patient adminsters IM himself    . amphetamine-dextroamphetamine (ADDERALL) 10 MG tablet Take 1 tablet (10 mg total) by mouth daily at 12 noon. 30 tablet 0  . amphetamine-dextroamphetamine (ADDERALL) 20 MG tablet Take 1 tablet (20 mg total) by mouth daily. 30 tablet 0  . emtricitabine-tenofovir (TRUVADA) 200-300 MG tablet Take 1 tablet by mouth daily.    . famciclovir (FAMVIR) 250 MG tablet Take 1 tablet (250 mg total) by mouth 2 (two) times daily. 60 tablet 5  . gabapentin (NEURONTIN) 300 MG capsule Start with 1 tab po qhs X 1 week, then increase to 1 tab po bid X 1 week then 1 tab po tid prn 90 capsule 1  . sertraline (ZOLOFT) 50 MG tablet Take 1 tablet (50 mg total) by mouth at bedtime. 90 tablet 1  . triamcinolone cream (KENALOG) 0.5 % Apply 1 application topically 3 (three) times daily. 454 g 1  . valACYclovir (VALTREX) 500 MG tablet Take 500 mg by mouth as needed.     No facility-administered medications prior to visit.     ROS Review of Systems  Skin: Positive for rash.    Objective:  BP 128/80 (BP Location: Left Arm, Patient Position: Sitting, Cuff Size: Normal)   Pulse 98   Temp 98.2 F (36.8 C) (Oral)   Resp 16   Ht 5' 4.5" (1.638 m)   Wt 180 lb (81.6 kg)   SpO2 98%   BMI 30.42 kg/m   BP Readings from Last 3 Encounters:  08/11/16 128/80  08/04/16 118/74  07/29/16 126/80    Wt Readings from Last 3 Encounters:  08/11/16 180 lb (81.6 kg)  08/04/16 183 lb 0.6 oz (83 kg)  07/29/16 188 lb (85.3 kg)    Physical Exam  Skin: Rash noted. Rash is maculopapular.   There are erythematous and scaly macules and papules over his torso and extremities  The lesion over the left mid flank is biopsied. The area was cleaned with Betadine. Local anesthesia was obtained with 1 mL of 2% lidocaine with epi. Next a 6 mm punch incision was performed and skin specimen obtained. The wound was closed with one simple interrupted suture using 3-0 nylon. Closure affect is good, hemostasis obtained, no complications noted. Neosporin and dressing were applied.    Lab Results  Component Value Date   WBC 6.1 07/19/2016   HGB 16.2 07/19/2016   HCT 47.2 07/19/2016   PLT 265.0 07/19/2016   GLUCOSE 96 07/19/2016   CHOL 167 02/20/2012   TRIG 163 (H) 02/20/2012   HDL 43 02/20/2012   LDLCALC 91 02/20/2012   ALT 24 07/19/2016   AST 20 07/19/2016   NA 141 07/19/2016   K 3.8 07/19/2016   CL 101 07/19/2016   CREATININE 0.90 07/19/2016   BUN 10 07/19/2016   CO2 30 07/19/2016   TSH 1.50 06/12/2014    No results found.  Assessment & Plan:   Jahson was seen today for rash.  Diagnoses and all orders for this visit:  Rash- will use the biopsy results to direct treatment of  his rash. -     Dermatology pathology; Future  Need for diphtheria-tetanus-pertussis (Tdap) vaccine -     Tdap vaccine greater than or equal to 7yo IM  Rash and nonspecific skin eruption   I am having Mr. Chain maintain his Adalimumab, sertraline, famciclovir, triamcinolone cream, emtricitabine-tenofovir, gabapentin, valACYclovir, amphetamine-dextroamphetamine, and amphetamine-dextroamphetamine.  No orders of the defined types were placed in this encounter.    Follow-up: Return in about 1 week (around 08/18/2016).  Sanda Linger, MD

## 2016-08-11 NOTE — Progress Notes (Signed)
Pre visit review using our clinic review tool, if applicable. No additional management support is needed unless otherwise documented below in the visit note. 

## 2016-08-11 NOTE — Patient Instructions (Signed)
Skin Biopsy, Care After Refer to this sheet in the next few weeks. These instructions provide you with information about caring for yourself after your procedure. Your health care provider may also give you more specific instructions. Your treatment has been planned according to current medical practices, but problems sometimes occur. Call your health care provider if you have any problems or questions after your procedure. What can I expect after the procedure? After the procedure, it is common to have:  Soreness.  Bruising.  Itching. Follow these instructions at home:  Rest and then return to your normal activities as told by your health care provider.  Take over-the-counter and prescription medicines only as told by your health care provider.  Follow instructions from your health care provider about how to take care of your biopsy site.Make sure you:  Wash your hands with soap and water before you change your bandage (dressing). If soap and water are not available, use hand sanitizer.  Change your dressing as told by your health care provider.  Leave stitches (sutures), skin glue, or adhesive strips in place. These skin closures may need to stay in place for 2 weeks or longer. If adhesive strip edges start to loosen and curl up, you may trim the loose edges. Do not remove adhesive strips completely unless your health care provider tells you to do that. If the biopsy area bleeds, apply gentle pressure for 10 minutes.  Check your biopsy site every day for signs of infection. Check for:  More redness, swelling, or pain.  More fluid or blood.  Warmth.  Pus or a bad smell.  Keep all follow-up visits as told by your health care provider. This is important. Contact a health care provider if:  You have more redness, swelling, or pain around your biopsy site.  You have more fluid or blood coming from your biopsy site.  Your biopsy site feels warm to the touch.  You have pus or a  bad smell coming from your biopsy site.  You have a fever. Get help right away if:  You have bleeding that does not stop with pressure or a dressing. This information is not intended to replace advice given to you by your health care provider. Make sure you discuss any questions you have with your health care provider. Document Released: 05/08/2015 Document Revised: 12/06/2015 Document Reviewed: 07/09/2014 Elsevier Interactive Patient Education  2017 [employer] Inc.  

## 2016-08-16 ENCOUNTER — Encounter: Payer: Self-pay | Admitting: Internal Medicine

## 2016-08-16 ENCOUNTER — Other Ambulatory Visit: Payer: Self-pay | Admitting: Internal Medicine

## 2016-08-16 DIAGNOSIS — L738 Other specified follicular disorders: Secondary | ICD-10-CM

## 2016-08-16 MED ORDER — ITRACONAZOLE 100 MG PO CAPS: 100.0000 mg | ORAL_CAPSULE | Freq: Every day | ORAL | 1 refills | Status: DC

## 2016-08-17 ENCOUNTER — Telehealth: Payer: Self-pay

## 2016-08-17 NOTE — Telephone Encounter (Signed)
Key: [id number]

## 2016-08-18 ENCOUNTER — Other Ambulatory Visit: Payer: Self-pay | Admitting: Internal Medicine

## 2016-08-18 ENCOUNTER — Ambulatory Visit: Payer: 59 | Admitting: Internal Medicine

## 2016-08-18 DIAGNOSIS — L738 Other specified follicular disorders: Secondary | ICD-10-CM

## 2016-08-18 MED ORDER — KETOCONAZOLE 2 % EX SHAM: 1.0000 "application " | MEDICATED_SHAMPOO | Freq: Every day | CUTANEOUS | 0 refills | Status: AC

## 2016-08-18 NOTE — Telephone Encounter (Signed)
Pt informed of change to alternative to Itraconazole.

## 2016-08-18 NOTE — Telephone Encounter (Signed)
The PA for this medication was denied is there any information that I can use to appeal the decision.

## 2016-08-18 NOTE — Telephone Encounter (Signed)
Change to nizoral shampoo Use QD for 10 days RX sent

## 2016-08-23 ENCOUNTER — Ambulatory Visit (INDEPENDENT_AMBULATORY_CARE_PROVIDER_SITE_OTHER): Payer: 59 | Admitting: Internal Medicine

## 2016-08-23 ENCOUNTER — Encounter: Payer: Self-pay | Admitting: Internal Medicine

## 2016-08-23 VITALS — BP 120/80 | HR 100 | Temp 98.1°F | Resp 16 | Ht 64.45 in | Wt 181.5 lb

## 2016-08-23 DIAGNOSIS — L738 Other specified follicular disorders: Secondary | ICD-10-CM

## 2016-08-23 NOTE — Progress Notes (Signed)
Pre visit review using our clinic review tool, if applicable. No additional management support is needed unless otherwise documented below in the visit note. 

## 2016-08-23 NOTE — Progress Notes (Signed)
Subjective:  Patient ID: Keith Walker, male    DOB: 08/08/1985  Age: 31 y.o. MRN: [id number]  CC: Rash   HPI Keith Walker presents for f/up after a skin bx was done over his left flank 10 days ago. The biopsy revealed non-specific folliculitis. In light of the recent antibiotics he has taken I think this is most likely yeast. I tried to treat with sporanox but his insurer would not approve sporanox so he is using topical nizoral shampoo and he tells me that the rash is much improved. The bx sight has healed.  Outpatient Medications Prior to Visit  Medication Sig Dispense Refill  . Adalimumab (HUMIRA PEN) 40 MG/0.8ML PNKT Inject 40 mg into the muscle every 14 (fourteen) days. Patient adminsters IM himself    . amphetamine-dextroamphetamine (ADDERALL) 10 MG tablet Take 1 tablet (10 mg total) by mouth daily at 12 noon. 30 tablet 0  . amphetamine-dextroamphetamine (ADDERALL) 20 MG tablet Take 1 tablet (20 mg total) by mouth daily. 30 tablet 0  . emtricitabine-tenofovir (TRUVADA) 200-300 MG tablet Take 1 tablet by mouth daily.    . famciclovir (FAMVIR) 250 MG tablet Take 1 tablet (250 mg total) by mouth 2 (two) times daily. 60 tablet 5  . gabapentin (NEURONTIN) 300 MG capsule Start with 1 tab po qhs X 1 week, then increase to 1 tab po bid X 1 week then 1 tab po tid prn 90 capsule 1  . ketoconazole (NIZORAL) 2 % shampoo Apply 1 application topically daily. 120 mL 0  . sertraline (ZOLOFT) 50 MG tablet Take 1 tablet (50 mg total) by mouth at bedtime. 90 tablet 1  . triamcinolone cream (KENALOG) 0.5 % Apply 1 application topically 3 (three) times daily. 454 g 1  . valACYclovir (VALTREX) 500 MG tablet Take 500 mg by mouth as needed.     No facility-administered medications prior to visit.     ROS Review of Systems  Constitutional: Negative for chills, diaphoresis, fatigue and unexpected weight change.  HENT: Negative.  Negative for sore throat.   Eyes: Negative for visual  disturbance.  Respiratory: Negative for cough, chest tightness, shortness of breath and wheezing.   Cardiovascular: Negative for chest pain, palpitations and leg swelling.  Gastrointestinal: Negative for abdominal pain, constipation, diarrhea, nausea and vomiting.  Genitourinary: Negative.   Skin: Positive for rash. Negative for color change.  Allergic/Immunologic: Negative.   Neurological: Negative.   Hematological: Negative for adenopathy. Does not bruise/bleed easily.  Psychiatric/Behavioral: Negative.     Objective:  BP 120/80 (BP Location: Left Arm, Patient Position: Sitting, Cuff Size: Normal)   Pulse 100   Temp 98.1 F (36.7 C) (Oral)   Resp 16   Ht 5' 4.45" (1.637 m)   Wt 181 lb 8 oz (82.3 kg)   SpO2 99%   BMI 30.72 kg/m   BP Readings from Last 3 Encounters:  08/23/16 120/80  08/11/16 128/80  08/04/16 118/74    Wt Readings from Last 3 Encounters:  08/23/16 181 lb 8 oz (82.3 kg)  08/11/16 180 lb (81.6 kg)  08/04/16 183 lb 0.6 oz (83 kg)    Physical Exam  Constitutional: He is oriented to person, place, and time. No distress.  HENT:  Mouth/Throat: Oropharynx is clear and moist. No oropharyngeal exudate.  Eyes: Conjunctivae are normal. Right eye exhibits no discharge. Left eye exhibits no discharge. No scleral icterus.  Neck: Normal range of motion. Neck supple. No JVD present. No tracheal deviation present. No thyromegaly present.  Cardiovascular:  Normal rate, regular rhythm, normal heart sounds and intact distal pulses.  Exam reveals no gallop and no friction rub.   No murmur heard. Pulmonary/Chest: Effort normal and breath sounds normal. No stridor. No respiratory distress. He has no wheezes. He has no rales. He exhibits no tenderness.  Abdominal: Soft. Bowel sounds are normal. He exhibits no distension and no mass. There is no tenderness. There is no rebound and no guarding.  Musculoskeletal: Normal range of motion. He exhibits no edema or tenderness.    Lymphadenopathy:    He has no cervical adenopathy.  Neurological: He is alert and oriented to person, place, and time.  Skin: Skin is warm and dry. Rash noted. No purpura noted. Rash is papular. Rash is not maculopapular, not pustular, not vesicular and not urticarial. He is not diaphoretic. No erythema. No pallor.  There is marked decrease in the number and size of his skin lesions  Suture removed from the left flank and the sight has healed without complication  Vitals reviewed.   Lab Results  Component Value Date   WBC 6.1 07/19/2016   HGB 16.2 07/19/2016   HCT 47.2 07/19/2016   PLT 265.0 07/19/2016   GLUCOSE 96 07/19/2016   CHOL 167 02/20/2012   TRIG 163 (H) 02/20/2012   HDL 43 02/20/2012   LDLCALC 91 02/20/2012   ALT 24 07/19/2016   AST 20 07/19/2016   NA 141 07/19/2016   K 3.8 07/19/2016   CL 101 07/19/2016   CREATININE 0.90 07/19/2016   BUN 10 07/19/2016   CO2 30 07/19/2016   TSH 1.50 06/12/2014    No results found.  Assessment & Plan:   Keith Walker was seen today for rash.  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  No orders of the defined types were placed in this encounter.    Follow-up: No Follow-up on file.  Sanda Linger, MD

## 2016-08-24 NOTE — Patient Instructions (Signed)
Folliculitis Folliculitis is inflammation of the hair follicles. Folliculitis most commonly occurs on the scalp, thighs, legs, back, and buttocks. However, it can occur anywhere on the body. What are the causes? This condition may be caused by:  A bacterial infection (common).  A fungal infection.  A viral infection.  Coming into contact with certain chemicals, especially oils and tars.  Shaving or waxing.  Applying greasy ointments or creams to your skin often. Long-lasting folliculitis and folliculitis that keeps coming back can be caused by bacteria that live in the nostrils. What increases the risk? This condition is more likely to develop in people with:  A weakened immune system.  Diabetes.  Obesity. What are the signs or symptoms? Symptoms of this condition include:  Redness.  Soreness.  Swelling.  Itching.  Small white or yellow, pus-filled, itchy spots (pustules) that appear over a reddened area. If there is an infection that goes deep into the follicle, these may develop into a boil (furuncle).  A group of closely packed boils (carbuncle). These tend to form in hairy, sweaty areas of the body. How is this diagnosed? This condition is diagnosed with a skin exam. To find what is causing the condition, your health care provider may take a sample of one of the pustules or boils for testing. How is this treated? This condition may be treated by:  Applying warm compresses to the affected areas.  Taking an antibiotic medicine or applying an antibiotic medicine to the skin.  Applying or bathing with an antiseptic solution.  Taking an over-the-counter medicine to help with itching.  Having a procedure to drain any pustules or boils. This may be done if a pustule or boil contains a lot of pus or fluid.  Laser hair removal. This may be done to treat long-lasting folliculitis. Follow these instructions at home:  If directed, apply heat to the affected area as  often as told by your health care provider. Use the heat source that your health care provider recommends, such as a moist heat pack or a heating pad.  Place a towel between your skin and the heat source.  Leave the heat on for 20-30 minutes.  Remove the heat if your skin turns bright red. This is especially important if you are unable to feel pain, heat, or cold. You may have a greater risk of getting burned.  If you were prescribed an antibiotic medicine, use it as told by your health care provider. Do not stop using the antibiotic even if you start to feel better.  Take over-the-counter and prescription medicines only as told by your health care provider.  Do not shave irritated skin.  Keep all follow-up visits as told by your health care provider. This is important. Get help right away if:  You have more redness, swelling, or pain in the affected area.  Red streaks are spreading from the affected area.  You have a fever. This information is not intended to replace advice given to you by your health care provider. Make sure you discuss any questions you have with your health care provider. Document Released: 06/20/2001 Document Revised: 10/30/2015 Document Reviewed: 01/30/2015 Elsevier Interactive Patient Education  2017 [employer]  

## 2016-09-14 ENCOUNTER — Encounter: Payer: Self-pay | Admitting: Internal Medicine

## 2016-09-15 ENCOUNTER — Other Ambulatory Visit: Payer: Self-pay | Admitting: Internal Medicine

## 2016-09-15 DIAGNOSIS — F900 Attention-deficit hyperactivity disorder, predominantly inattentive type: Secondary | ICD-10-CM

## 2016-09-15 MED ORDER — AMPHETAMINE-DEXTROAMPHETAMINE 20 MG PO TABS: 20.0000 mg | ORAL_TABLET | Freq: Every day | ORAL | 0 refills | Status: DC

## 2016-09-15 MED ORDER — AMPHETAMINE-DEXTROAMPHETAMINE 10 MG PO TABS: 10.0000 mg | ORAL_TABLET | Freq: Every day | ORAL | 0 refills | Status: DC

## 2016-09-18 ENCOUNTER — Other Ambulatory Visit: Payer: Self-pay | Admitting: Internal Medicine

## 2016-09-20 NOTE — Telephone Encounter (Signed)
Faxed script back to [facility].../lmb 

## 2016-09-26 IMAGING — MR MR [PERSON_NAME] ABD W/CM
10 of 16 series · 24 of 48 positions shown · IV contrast (multihance)
Comparison: CT abdomen pelvis dated 03/29/2011.

CLINICAL DATA: Crohn's disease, usually with left-sided pain, now
with right and transverse colonic pain, evaluate for progression of
disease.

EXAM:
MR ABDOMEN AND PELVIS WITHOUT AND WITH CONTRAST (MR ENTEROGRAPHY)
TECHNIQUE: Multiplanar, multisequence MRI of the abdomen and pelvis was
performed both before and during bolus administration of intravenous
contrast. Negative oral contrast VoLumen was given.
CONTRAST:  20mL MULTIHANCE GADOBENATE DIMEGLUMINE 529 MG/ML IV SOLN

[Series 3: T2 · axial · 5.0mm · 0.86mm/px · 1 of 93 slices shown (1 of 2)]
[im 1/93]
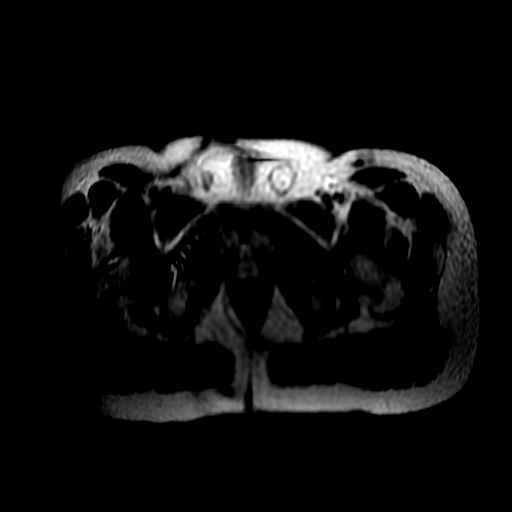

[Series 4: T2 · coronal · 5.0mm · 0.94mm/px · 1 of 47 slices shown (2 of 2)]
[im 1/47]
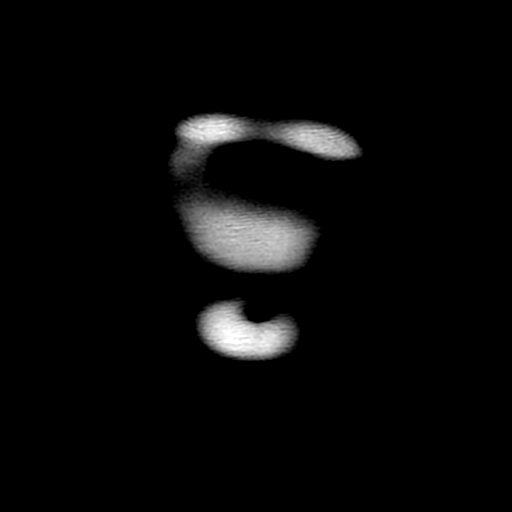

[Series 5: DWI b500 · axial · 6.0mm · 1.56mm/px · z∈[-241,+219]mm · 2 of 120 slices shown]
[im 1/120]
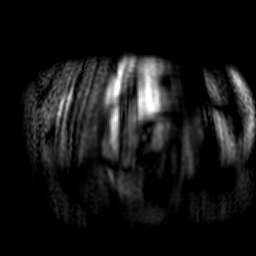
[im 120/120]
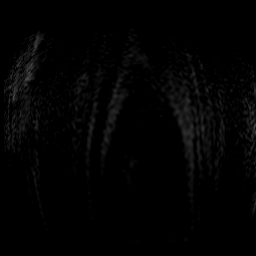

[Series 6: bSSFP · coronal · 5.0mm · 0.94mm/px · 1 of 47 slices shown]
[im 1/47]
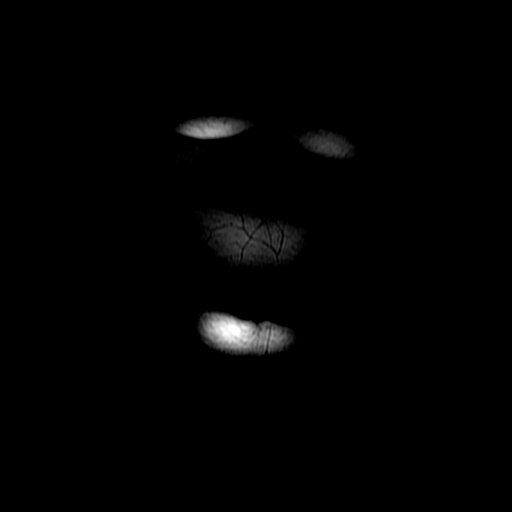

[Series 8: T1 dynamic · coronal · delayed · 4.4mm · 0.94mm/px · 2 of 104 slices shown (1 of 4)]
[im 1/104]
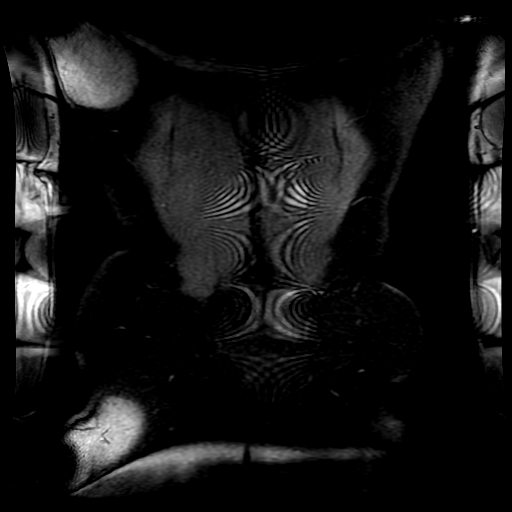
[im 104/104]
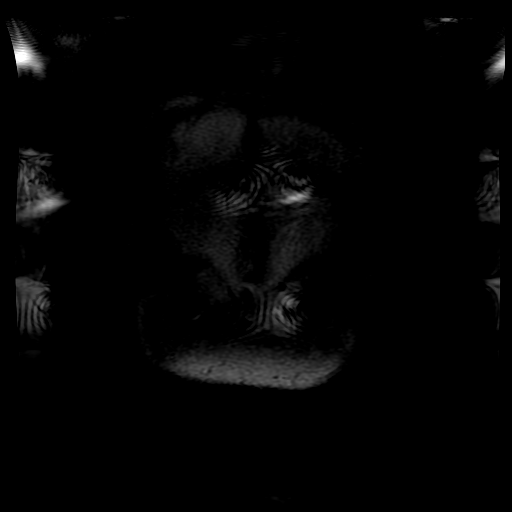

[Series 9: T1 dynamic post-contrast · axial · 6.4mm · 1.80mm/px · z∈[-221,+211]mm · 3 of 136 slices shown]
[im 1/136]
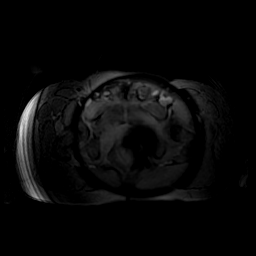
[im 68/136]
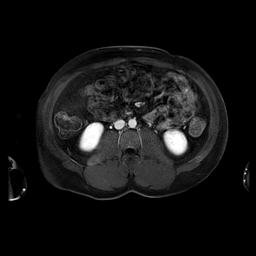
[im 136/136]
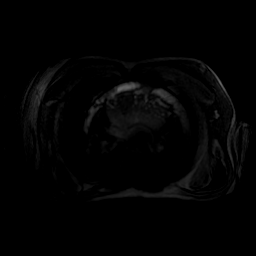

[Series 500: DWI · axial · 6.0mm · 1.56mm/px · z∈[-241,+219]mm · 2 of 60 slices shown]
[im 1/60]
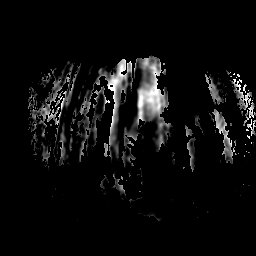
[im 60/60]
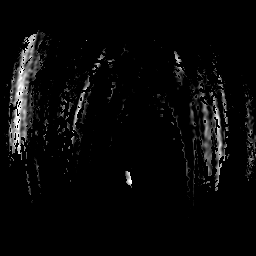

[Series 700: T1 dynamic · axial · 6.4mm · 0.78mm/px · z∈[-223,+209]mm · 4 of 136 slices shown (2 of 4)]
[im 1/136]
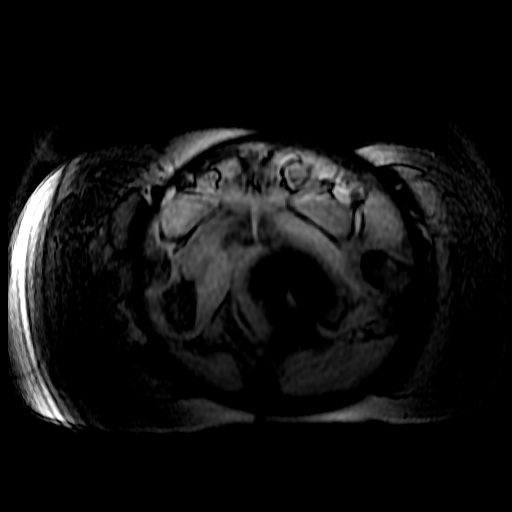
[im 46/136]
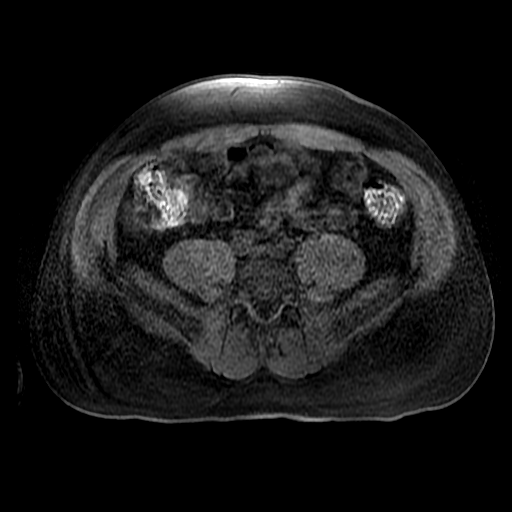
[im 91/136]
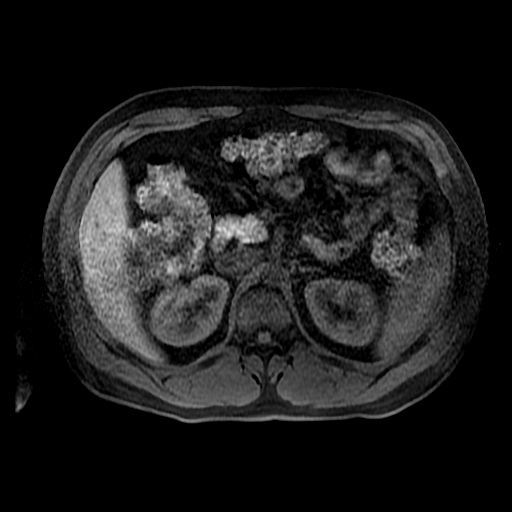
[im 136/136]
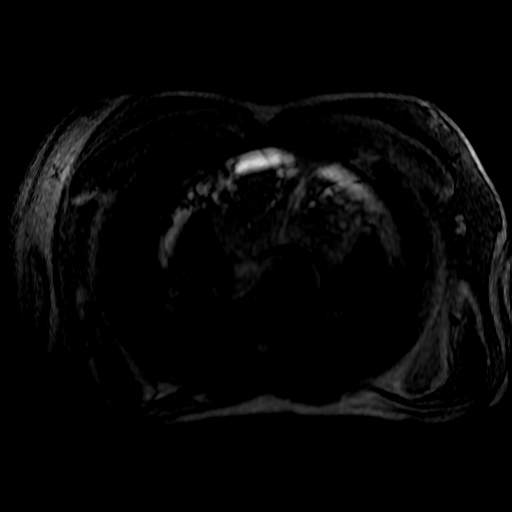

[Series 701: T1 dynamic · axial · 6.4mm · 0.78mm/px · z∈[-223,+209]mm · 4 of 136 slices shown (3 of 4)]
[im 1/136]
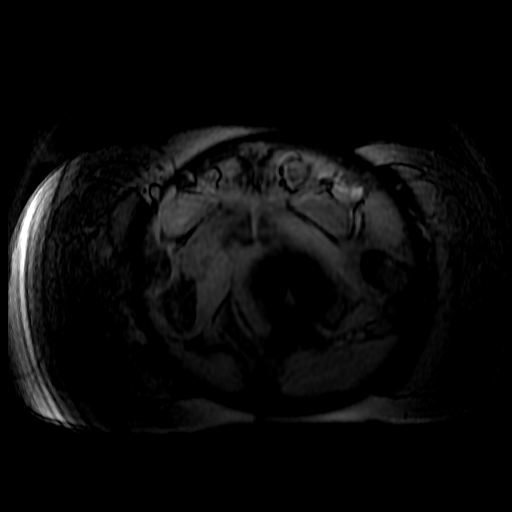
[im 46/136]
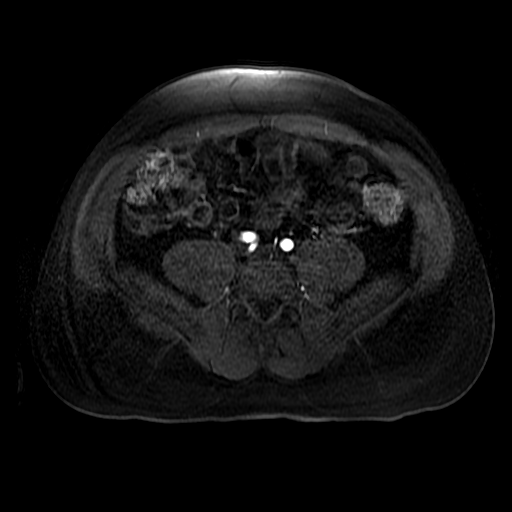
[im 91/136]
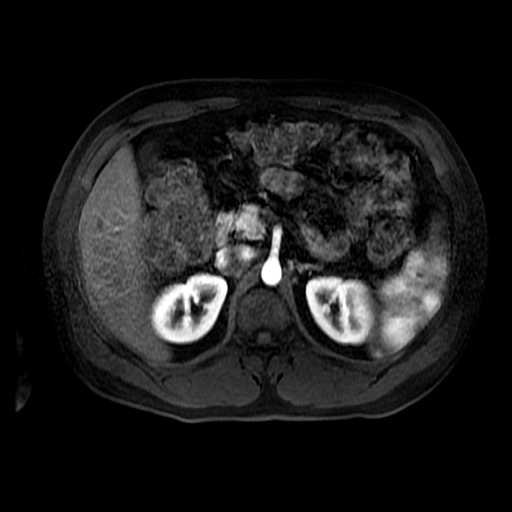
[im 136/136]
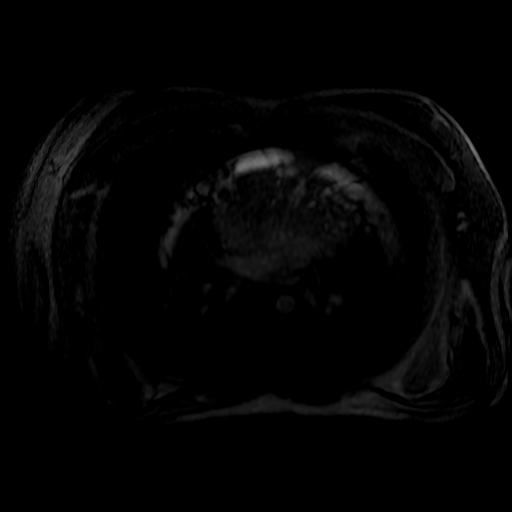

[Series 702: T1 dynamic · axial · 6.4mm · 0.78mm/px · z∈[-223,+209]mm · 4 of 136 slices shown (4 of 4)]
[im 1/136]
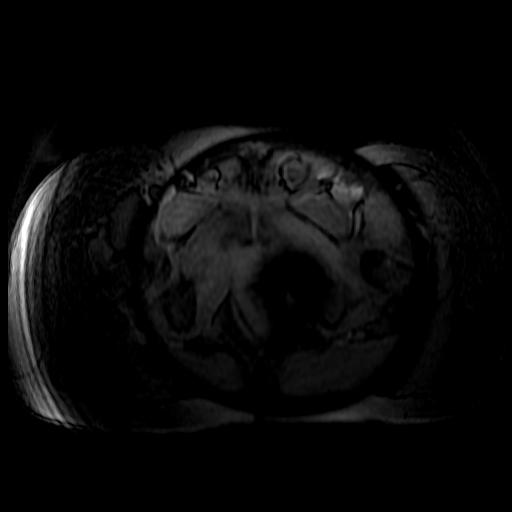
[im 46/136]
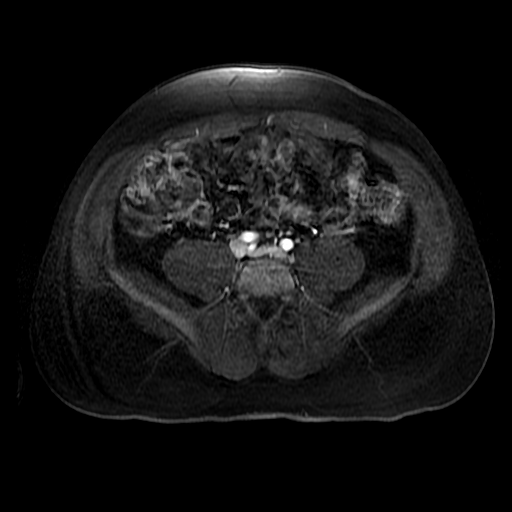
[im 91/136]
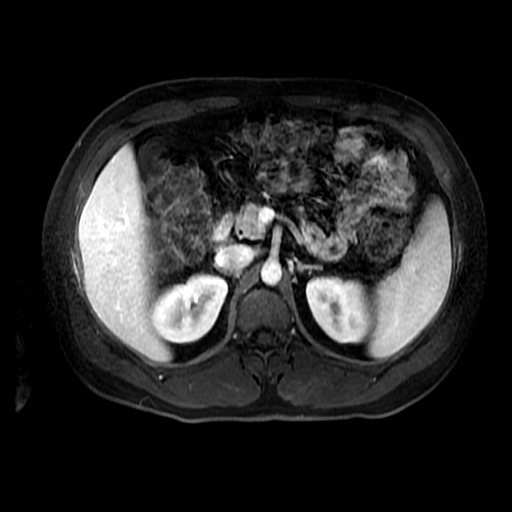
[im 136/136]
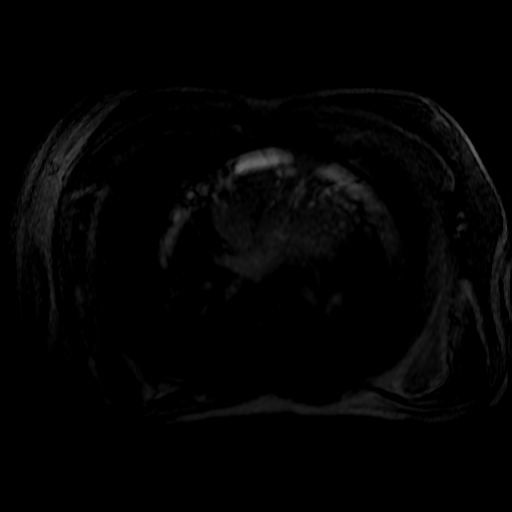

[24 of 48 positions shown; findings below may reference images not displayed]

FINDINGS: Lower chest:  Lung bases are clear.

Hepatobiliary: Liver is within normal limits. No
suspicious/enhancing hepatic lesions.

Gallbladder is within normal limits. No intrahepatic or extrahepatic
ductal dilatation.

Pancreas: Within normal limits.

Spleen: Within normal limits.

Adrenals/Urinary Tract: Adrenal glands are within normal limits.

Kidneys are within normal limits.  No hydronephrosis.

Bladder is within normal limits.

Stomach/Bowel: Stomach is within normal limits.

No evidence of bowel obstruction. No small bowel wall thickening or
inflammatory changes. No evidence of stricture.

No colonic wall thickening or inflammatory changes.

No evidence of fistula or abscess.

Vascular/Lymphatic: No evidence of abdominal aortic aneurysm.

No suspicious abdominopelvic lymphadenopathy.

Reproductive: Prostate is unremarkable.

Other: No abdominopelvic ascites.

Musculoskeletal: No focal osseous lesions.
IMPRESSION: No evidence of acute inflammatory Crohn's disease. No evidence of
bowel obstruction or stricture.

Normal MRI abdomen/pelvis.

## 2016-10-05 ENCOUNTER — Encounter: Payer: Self-pay | Admitting: Internal Medicine

## 2016-10-05 ENCOUNTER — Other Ambulatory Visit: Payer: Self-pay | Admitting: Internal Medicine

## 2016-10-05 DIAGNOSIS — Z202 Contact with and (suspected) exposure to infections with a predominantly sexual mode of transmission: Secondary | ICD-10-CM

## 2016-10-05 MED ORDER — AZITHROMYCIN 500 MG PO TABS: 2000.0000 mg | ORAL_TABLET | Freq: Every day | ORAL | 0 refills | Status: AC

## 2016-11-11 ENCOUNTER — Other Ambulatory Visit: Payer: Self-pay | Admitting: Internal Medicine

## 2017-01-02 ENCOUNTER — Other Ambulatory Visit: Payer: Self-pay | Admitting: Internal Medicine

## 2017-01-02 ENCOUNTER — Encounter: Payer: Self-pay | Admitting: Internal Medicine

## 2017-01-02 DIAGNOSIS — F900 Attention-deficit hyperactivity disorder, predominantly inattentive type: Secondary | ICD-10-CM

## 2017-01-02 MED ORDER — AMPHETAMINE-DEXTROAMPHETAMINE 10 MG PO TABS: 10.0000 mg | ORAL_TABLET | Freq: Every day | ORAL | 0 refills | Status: AC

## 2017-01-02 MED ORDER — AMPHETAMINE-DEXTROAMPHETAMINE 20 MG PO TABS: 20.0000 mg | ORAL_TABLET | Freq: Every day | ORAL | 0 refills | Status: AC

## 2017-01-02 NOTE — Telephone Encounter (Signed)
Place script up front for pick-up.../lmb
